# Patient Record
Sex: Female | Born: 1980 | Race: White | Hispanic: No | Smoking: Never smoker
Health system: Southern US, Community
[De-identification: ages and names within clinical notes are randomized; demographics above are authoritative.]

## PROBLEM LIST (undated history)

## (undated) DIAGNOSIS — N200 Calculus of kidney: Secondary | ICD-10-CM

## (undated) DIAGNOSIS — M5136 Other intervertebral disc degeneration, lumbar region: Secondary | ICD-10-CM

## (undated) DIAGNOSIS — U071 COVID-19: Secondary | ICD-10-CM

## (undated) DIAGNOSIS — M51369 Other intervertebral disc degeneration, lumbar region without mention of lumbar back pain or lower extremity pain: Secondary | ICD-10-CM

## (undated) DIAGNOSIS — D649 Anemia, unspecified: Secondary | ICD-10-CM

## (undated) DIAGNOSIS — E119 Type 2 diabetes mellitus without complications: Secondary | ICD-10-CM

## (undated) DIAGNOSIS — I82409 Acute embolism and thrombosis of unspecified deep veins of unspecified lower extremity: Secondary | ICD-10-CM

## (undated) HISTORY — PX: CARPAL TUNNEL RELEASE: SHX101

## (undated) HISTORY — DX: Calculus of kidney: N20.0

## (undated) HISTORY — PX: SHOULDER ARTHROSCOPY: SHX128

---

## 2004-06-28 ENCOUNTER — Emergency Department: Payer: Self-pay | Admitting: General Practice

## 2004-10-28 ENCOUNTER — Emergency Department: Payer: Self-pay | Admitting: Emergency Medicine

## 2006-03-31 ENCOUNTER — Emergency Department: Payer: Self-pay | Admitting: Emergency Medicine

## 2007-08-12 HISTORY — PX: TUBAL LIGATION: SHX77

## 2008-07-09 ENCOUNTER — Emergency Department: Payer: Self-pay | Admitting: Emergency Medicine

## 2009-06-23 ENCOUNTER — Emergency Department: Payer: Self-pay | Admitting: Emergency Medicine

## 2009-06-24 ENCOUNTER — Emergency Department: Payer: Self-pay | Admitting: Internal Medicine

## 2010-01-21 ENCOUNTER — Emergency Department: Payer: Self-pay | Admitting: Emergency Medicine

## 2010-02-02 ENCOUNTER — Ambulatory Visit: Payer: Self-pay | Admitting: Gastroenterology

## 2010-02-02 ENCOUNTER — Emergency Department: Payer: Self-pay | Admitting: Unknown Physician Specialty

## 2010-03-02 ENCOUNTER — Ambulatory Visit: Payer: Self-pay | Admitting: Gastroenterology

## 2010-03-25 ENCOUNTER — Ambulatory Visit: Payer: Self-pay | Admitting: Gastroenterology

## 2010-04-01 ENCOUNTER — Ambulatory Visit: Payer: Self-pay | Admitting: Gastroenterology

## 2010-04-07 LAB — PATHOLOGY REPORT

## 2010-04-21 ENCOUNTER — Ambulatory Visit: Payer: Self-pay | Admitting: Gastroenterology

## 2010-05-12 ENCOUNTER — Ambulatory Visit: Payer: Self-pay | Admitting: Gastroenterology

## 2010-08-12 DIAGNOSIS — F341 Dysthymic disorder: Secondary | ICD-10-CM | POA: Insufficient documentation

## 2010-08-12 DIAGNOSIS — F411 Generalized anxiety disorder: Secondary | ICD-10-CM | POA: Insufficient documentation

## 2010-08-22 ENCOUNTER — Ambulatory Visit: Payer: Self-pay

## 2010-08-24 ENCOUNTER — Ambulatory Visit: Payer: Self-pay

## 2010-11-25 ENCOUNTER — Ambulatory Visit: Payer: Self-pay

## 2010-12-09 ENCOUNTER — Ambulatory Visit: Payer: Self-pay

## 2011-04-08 ENCOUNTER — Ambulatory Visit: Payer: Self-pay

## 2011-04-12 ENCOUNTER — Emergency Department: Payer: Self-pay | Admitting: Emergency Medicine

## 2011-04-27 DIAGNOSIS — M5430 Sciatica, unspecified side: Secondary | ICD-10-CM | POA: Insufficient documentation

## 2011-06-10 DIAGNOSIS — E1165 Type 2 diabetes mellitus with hyperglycemia: Secondary | ICD-10-CM | POA: Insufficient documentation

## 2011-06-10 DIAGNOSIS — R609 Edema, unspecified: Secondary | ICD-10-CM | POA: Insufficient documentation

## 2011-06-10 DIAGNOSIS — N920 Excessive and frequent menstruation with regular cycle: Secondary | ICD-10-CM | POA: Insufficient documentation

## 2011-12-27 DIAGNOSIS — M25561 Pain in right knee: Secondary | ICD-10-CM | POA: Insufficient documentation

## 2012-04-15 ENCOUNTER — Emergency Department: Payer: Self-pay | Admitting: Emergency Medicine

## 2012-04-15 LAB — COMPREHENSIVE METABOLIC PANEL
Anion Gap: 11 (ref 7–16)
BUN: 12 mg/dL (ref 7–18)
Bilirubin,Total: 3.1 mg/dL — ABNORMAL HIGH (ref 0.2–1.0)
Calcium, Total: 8.4 mg/dL — ABNORMAL LOW (ref 8.5–10.1)
Chloride: 103 mmol/L (ref 98–107)
Glucose: 380 mg/dL — ABNORMAL HIGH (ref 65–99)
Potassium: 3.4 mmol/L — ABNORMAL LOW (ref 3.5–5.1)
SGOT(AST): 23 U/L (ref 15–37)
Sodium: 133 mmol/L — ABNORMAL LOW (ref 136–145)

## 2012-04-15 LAB — URINALYSIS, COMPLETE
Glucose,UR: >=500 mg/dL (ref 0–75)
Nitrite: NEGATIVE
Ph: 6 (ref 4.5–8.0)
RBC,UR: 202 /HPF (ref 0–5)
Specific Gravity: 1.038 (ref 1.003–1.030)
Squamous Epithelial: 11

## 2012-04-15 LAB — CBC
HCT: 45 % (ref 35.0–47.0)
MCHC: 34.7 g/dL (ref 32.0–36.0)
MCV: 86 fL (ref 80–100)
WBC: 5.7 10*3/uL (ref 3.6–11.0)

## 2012-04-15 LAB — LIPASE, BLOOD: Lipase: 116 U/L (ref 73–393)

## 2012-08-06 ENCOUNTER — Emergency Department: Payer: Self-pay | Admitting: Emergency Medicine

## 2012-08-06 LAB — URINALYSIS, COMPLETE
Bilirubin,UR: NEGATIVE
Glucose,UR: 50 mg/dL (ref 0–75)
Ketone: NEGATIVE
Nitrite: POSITIVE
Ph: 7 (ref 4.5–8.0)
Protein: 30

## 2012-08-06 LAB — CBC
HGB: 13.2 g/dL (ref 12.0–16.0)
MCH: 29.3 pg (ref 26.0–34.0)
WBC: 6.7 10*3/uL (ref 3.6–11.0)

## 2012-08-06 LAB — BASIC METABOLIC PANEL
Anion Gap: 5 — ABNORMAL LOW (ref 7–16)
Co2: 28 mmol/L (ref 21–32)
EGFR (Non-African Amer.): >60
Glucose: 182 mg/dL — ABNORMAL HIGH (ref 65–99)
Osmolality: 285 (ref 275–301)
Sodium: 141 mmol/L (ref 136–145)

## 2012-08-06 LAB — CK TOTAL AND CKMB (NOT AT ARMC)
CK, Total: 198 U/L (ref 21–215)
CK-MB: 2.8 ng/mL (ref 0.5–3.6)
CK-MB: 2.2 ng/mL (ref 0.5–3.6)

## 2012-09-12 DIAGNOSIS — G5603 Carpal tunnel syndrome, bilateral upper limbs: Secondary | ICD-10-CM | POA: Insufficient documentation

## 2012-11-16 DIAGNOSIS — E282 Polycystic ovarian syndrome: Secondary | ICD-10-CM | POA: Insufficient documentation

## 2012-11-16 DIAGNOSIS — E1140 Type 2 diabetes mellitus with diabetic neuropathy, unspecified: Secondary | ICD-10-CM | POA: Insufficient documentation

## 2013-05-27 ENCOUNTER — Emergency Department: Payer: Self-pay | Admitting: Emergency Medicine

## 2013-05-27 LAB — COMPREHENSIVE METABOLIC PANEL
Albumin: 3.9 g/dL (ref 3.4–5.0)
Alkaline Phosphatase: 48 U/L
Anion Gap: 8 (ref 7–16)
BUN: 9 mg/dL (ref 7–18)
Bilirubin,Total: 1.4 mg/dL — ABNORMAL HIGH (ref 0.2–1.0)
CREATININE: 0.9 mg/dL (ref 0.60–1.30)
Calcium, Total: 8.7 mg/dL (ref 8.5–10.1)
Chloride: 106 mmol/L (ref 98–107)
Co2: 22 mmol/L (ref 21–32)
EGFR (African American): >60
EGFR (Non-African Amer.): >60
Glucose: 289 mg/dL — ABNORMAL HIGH (ref 65–99)
Osmolality: 281 (ref 275–301)
POTASSIUM: 3.8 mmol/L (ref 3.5–5.1)
SGOT(AST): 46 U/L — ABNORMAL HIGH (ref 15–37)
SGPT (ALT): 51 U/L (ref 12–78)
SODIUM: 136 mmol/L (ref 136–145)
Total Protein: 7.4 g/dL (ref 6.4–8.2)

## 2013-05-27 LAB — CBC WITH DIFFERENTIAL/PLATELET
Basophil #: 0.1 10*3/uL (ref 0.0–0.1)
Basophil %: 0.8 %
EOS ABS: 0.2 10*3/uL (ref 0.0–0.7)
Eosinophil %: 2.6 %
HCT: 42.2 % (ref 35.0–47.0)
HGB: 14 g/dL (ref 12.0–16.0)
LYMPHS PCT: 42.5 %
Lymphocyte #: 2.8 10*3/uL (ref 1.0–3.6)
MCH: 28.8 pg (ref 26.0–34.0)
MCHC: 33.2 g/dL (ref 32.0–36.0)
MCV: 87 fL (ref 80–100)
MONO ABS: 0.3 x10 3/mm (ref 0.2–0.9)
Monocyte %: 4.1 %
NEUTROS ABS: 3.3 10*3/uL (ref 1.4–6.5)
Neutrophil %: 50 %
PLATELETS: 168 10*3/uL (ref 150–440)
RBC: 4.86 10*6/uL (ref 3.80–5.20)
RDW: 13.8 % (ref 11.5–14.5)
WBC: 6.5 10*3/uL (ref 3.6–11.0)

## 2013-05-27 LAB — URINALYSIS, COMPLETE
Bacteria: NONE SEEN
Bilirubin,UR: NEGATIVE
Blood: NEGATIVE
Glucose,UR: >=500 mg/dL (ref 0–75)
KETONE: NEGATIVE
Leukocyte Esterase: NEGATIVE
NITRITE: NEGATIVE
Ph: 5 (ref 4.5–8.0)
Protein: 30
SPECIFIC GRAVITY: 1.037 (ref 1.003–1.030)
Squamous Epithelial: 2

## 2013-05-27 LAB — PREGNANCY, URINE: Pregnancy Test, Urine: NEGATIVE m[IU]/mL

## 2013-05-27 LAB — TROPONIN I

## 2013-05-27 LAB — LIPASE, BLOOD: LIPASE: 222 U/L (ref 73–393)

## 2013-06-11 ENCOUNTER — Ambulatory Visit: Payer: Self-pay | Admitting: Gastroenterology

## 2013-06-12 LAB — PATHOLOGY REPORT

## 2014-01-11 DIAGNOSIS — I82409 Acute embolism and thrombosis of unspecified deep veins of unspecified lower extremity: Secondary | ICD-10-CM

## 2014-01-11 HISTORY — DX: Acute embolism and thrombosis of unspecified deep veins of unspecified lower extremity: I82.409

## 2014-01-11 HISTORY — PX: BACK SURGERY: SHX140

## 2014-03-13 ENCOUNTER — Ambulatory Visit: Payer: Self-pay | Admitting: Physician Assistant

## 2014-03-29 ENCOUNTER — Emergency Department: Payer: Self-pay | Admitting: Emergency Medicine

## 2014-05-29 DIAGNOSIS — M5116 Intervertebral disc disorders with radiculopathy, lumbar region: Secondary | ICD-10-CM | POA: Insufficient documentation

## 2014-06-27 DIAGNOSIS — Z6841 Body Mass Index (BMI) 40.0 and over, adult: Secondary | ICD-10-CM | POA: Insufficient documentation

## 2014-06-27 DIAGNOSIS — E66813 Obesity, class 3: Secondary | ICD-10-CM | POA: Insufficient documentation

## 2016-02-23 ENCOUNTER — Emergency Department
Admission: EM | Admit: 2016-02-23 | Discharge: 2016-02-23 | Disposition: A | Discharge status: Home / Self Care | Payer: BC Managed Care – PPO | Attending: Emergency Medicine | Admitting: Emergency Medicine

## 2016-02-23 ENCOUNTER — Encounter: Payer: Self-pay | Admitting: Emergency Medicine

## 2016-02-23 ENCOUNTER — Emergency Department: Payer: BC Managed Care – PPO

## 2016-02-23 DIAGNOSIS — M549 Dorsalgia, unspecified: Secondary | ICD-10-CM | POA: Diagnosis not present

## 2016-02-23 DIAGNOSIS — R110 Nausea: Secondary | ICD-10-CM

## 2016-02-23 DIAGNOSIS — R109 Unspecified abdominal pain: Secondary | ICD-10-CM | POA: Insufficient documentation

## 2016-02-23 DIAGNOSIS — E119 Type 2 diabetes mellitus without complications: Secondary | ICD-10-CM | POA: Diagnosis not present

## 2016-02-23 HISTORY — DX: Type 2 diabetes mellitus without complications: E11.9

## 2016-02-23 LAB — POCT PREGNANCY, URINE: Preg Test, Ur: NEGATIVE

## 2016-02-23 LAB — URINALYSIS, COMPLETE (UACMP) WITH MICROSCOPIC
BILIRUBIN URINE: NEGATIVE
Bacteria, UA: NONE SEEN
HGB URINE DIPSTICK: NEGATIVE
KETONES UR: NEGATIVE mg/dL
LEUKOCYTES UA: NEGATIVE
NITRITE: NEGATIVE
PH: 5 (ref 5.0–8.0)
PROTEIN: 100 mg/dL — AB
Specific Gravity, Urine: 1.037 — ABNORMAL HIGH (ref 1.005–1.030)

## 2016-02-23 LAB — CBC
HCT: 40.4 % (ref 35.0–47.0)
HEMOGLOBIN: 14.3 g/dL (ref 12.0–16.0)
MCH: 30.3 pg (ref 26.0–34.0)
MCHC: 35.3 g/dL (ref 32.0–36.0)
MCV: 85.7 fL (ref 80.0–100.0)
Platelets: 151 10*3/uL (ref 150–440)
RBC: 4.71 MIL/uL (ref 3.80–5.20)
RDW: 13.2 % (ref 11.5–14.5)
WBC: 6.3 10*3/uL (ref 3.6–11.0)

## 2016-02-23 LAB — COMPREHENSIVE METABOLIC PANEL
ALT: 27 U/L (ref 14–54)
ANION GAP: 7 (ref 5–15)
AST: 28 U/L (ref 15–41)
Albumin: 4.1 g/dL (ref 3.5–5.0)
Alkaline Phosphatase: 38 U/L (ref 38–126)
BUN: 13 mg/dL (ref 6–20)
CALCIUM: 9.1 mg/dL (ref 8.9–10.3)
CHLORIDE: 107 mmol/L (ref 101–111)
CO2: 22 mmol/L (ref 22–32)
Creatinine, Ser: 0.43 mg/dL — ABNORMAL LOW (ref 0.44–1.00)
Glucose, Bld: 309 mg/dL — ABNORMAL HIGH (ref 65–99)
Potassium: 3.8 mmol/L (ref 3.5–5.1)
Sodium: 136 mmol/L (ref 135–145)
Total Bilirubin: 1.9 mg/dL — ABNORMAL HIGH (ref 0.3–1.2)
Total Protein: 7.2 g/dL (ref 6.5–8.1)

## 2016-02-23 LAB — LIPASE, BLOOD: LIPASE: 34 U/L (ref 11–51)

## 2016-02-23 NOTE — ED Notes (Signed)
Could not provide a urine sample. Gave a specimen cup to use if she could.

## 2016-02-23 NOTE — ED Provider Notes (Signed)
[facility] Emergency Department Provider Note   ____________________________________________    I have reviewed the triage vital signs and the nursing notes.   HISTORY  Chief Complaint Abdominal Pain     HPI Katelyn Barnes is a 36 y.o. female who presents with complaints of nausea and mild abdominal discomfort. Patient reports she had a x-ray of her upper back performed 3 days ago because of a work accident. She was called this morning and notify that her x-ray may show a partial small bowel obstruction. She complains primarily of nausea and gas but denies bloating or severe abdominal pain. No history of abdominal surgeries   Past Medical History:  Diagnosis Date  . Diabetes mellitus without complication (HCC)     There are no active problems to display for this patient.   No past surgical history on file.  Prior to Admission medications   Not on File     Allergies Penicillins  No family history on file.  Social History Does not smoke, social alcohol use  Review of Systems  Constitutional: No fever/chills  Cardiovascular: Denies chest pain. Respiratory: Denies shortness of breath. Gastrointestinal: As above Genitourinary: Negative for dysuria. Musculoskeletal: Mild upper back pain Skin: Negative for rash. Neurological: Negative for  weakness  10-point ROS otherwise negative.  ____________________________________________   PHYSICAL EXAM:  VITAL SIGNS: ED Triage Vitals  Enc Vitals Group     BP 02/23/16 1005 133/74     Pulse Rate 02/23/16 1005 86     Resp 02/23/16 1005 18     Temp 02/23/16 1005 98.2 F (36.8 C)     Temp Source 02/23/16 1005 Oral     SpO2 02/23/16 1005 100 %     Weight 02/23/16 1006 266 lb (120.7 kg)     Height 02/23/16 1006 5\' 6"  (1.676 m)     Head Circumference --      Peak Flow --      Pain Score 02/23/16 1016 4     Pain Loc --      Pain Edu? --      Excl. in GC? --      Constitutional: Alert and oriented. No acute distress. Pleasant and interactive Eyes: Conjunctivae are normal.   Nose: No congestion/rhinnorhea. Mouth/Throat: Mucous membranes are moist.    Cardiovascular: Normal rate, regular rhythm. Grossly normal heart sounds.  Good peripheral circulation. Respiratory: Normal respiratory effort.  No retractions. Lungs CTAB. Gastrointestinal: Soft and nontender. No distention.  No CVA tenderness. Genitourinary: deferred Musculoskeletal:  Warm and well perfused Neurologic:  Normal speech and language. No gross focal neurologic deficits are appreciated.  Skin:  Skin is warm, dry and intact. No rash noted. Psychiatric: Mood and affect are normal. Speech and behavior are normal.  ____________________________________________   LABS (all labs ordered are listed, but only abnormal results are displayed)  Labs Reviewed  COMPREHENSIVE METABOLIC PANEL - Abnormal; Notable for the following:       Result Value   Glucose, Bld 309 (*)    Creatinine, Ser 0.43 (*)    Total Bilirubin 1.9 (*)    All other components within normal limits  URINALYSIS, COMPLETE (UACMP) WITH MICROSCOPIC - Abnormal; Notable for the following:    Color, Urine YELLOW (*)    APPearance CLEAR (*)    Specific Gravity, Urine 1.037 (*)    Glucose, UA >=500 (*)    Protein, ur 100 (*)    Squamous Epithelial / LPF 0-5 (*)    All  other components within normal limits  LIPASE, BLOOD  CBC  POCT PREGNANCY, URINE  POC URINE PREG, ED   ____________________________________________  EKG  None ____________________________________________  RADIOLOGY  X-rays unremarkable ____________________________________________   PROCEDURES  Procedure(s) performed: No    Critical Care performed: No ____________________________________________   INITIAL IMPRESSION / ASSESSMENT AND PLAN / ED COURSE  Pertinent labs & imaging results that were available during my care of the patient were  reviewed by me and considered in my medical decision making (see chart for details).  Patient well-appearing and in no acute distress. Her abdominal exam is reassuring and I strongly doubt SBO but we will obtain dedicated abdominal films    Normal abdominal x-rays, patient reassured, appropriate for discharge at this time ____________________________________________   FINAL CLINICAL IMPRESSION(S) / ED DIAGNOSES  Final diagnoses:  Nausea      NEW MEDICATIONS STARTED DURING THIS VISIT:  New Prescriptions   No medications on file     Note:  This document was prepared using Dragon voice recognition software and may include unintentional dictation errors.    Jene Every, MD 02/23/16 [phone]

## 2016-02-23 NOTE — ED Notes (Signed)
Pt states she was called by PCP this AM for a abnormal xray, states she saw them Friday for abd pain, states some mild nausea but has been tolerating food, states small BM this AM, pt awake and alert in no acute distress

## 2016-02-23 NOTE — ED Triage Notes (Signed)
Pt here after having back xray on Friday and was told she has a partial bowel obstruction. Pt reports intermittent abdominal pain, reports she's only able to have small BMs, a lot of belching and gas.

## 2016-04-29 ENCOUNTER — Other Ambulatory Visit: Payer: Self-pay | Admitting: Orthopedic Surgery

## 2016-05-03 ENCOUNTER — Ambulatory Visit (HOSPITAL_COMMUNITY)
Admission: RE | Admit: 2016-05-03 | Discharge: 2016-05-03 | Disposition: A | Discharge status: Home / Self Care | Payer: Worker's Compensation | Source: Ambulatory Visit | Attending: Orthopedic Surgery | Admitting: Orthopedic Surgery

## 2016-05-03 ENCOUNTER — Encounter (HOSPITAL_COMMUNITY): Payer: Self-pay

## 2016-05-03 ENCOUNTER — Encounter (HOSPITAL_COMMUNITY)
Admission: RE | Admit: 2016-05-03 | Discharge: 2016-05-03 | Disposition: A | Discharge status: Home / Self Care | Payer: Worker's Compensation | Source: Ambulatory Visit | Attending: Orthopedic Surgery | Admitting: Orthopedic Surgery

## 2016-05-03 DIAGNOSIS — E119 Type 2 diabetes mellitus without complications: Secondary | ICD-10-CM

## 2016-05-03 DIAGNOSIS — Z01812 Encounter for preprocedural laboratory examination: Secondary | ICD-10-CM

## 2016-05-03 DIAGNOSIS — Z7984 Long term (current) use of oral hypoglycemic drugs: Secondary | ICD-10-CM | POA: Insufficient documentation

## 2016-05-03 DIAGNOSIS — Z01818 Encounter for other preprocedural examination: Secondary | ICD-10-CM | POA: Insufficient documentation

## 2016-05-03 DIAGNOSIS — Z79899 Other long term (current) drug therapy: Secondary | ICD-10-CM

## 2016-05-03 DIAGNOSIS — Z9851 Tubal ligation status: Secondary | ICD-10-CM

## 2016-05-03 DIAGNOSIS — Z9889 Other specified postprocedural states: Secondary | ICD-10-CM

## 2016-05-03 HISTORY — DX: Acute embolism and thrombosis of unspecified deep veins of unspecified lower extremity: I82.409

## 2016-05-03 HISTORY — DX: Other intervertebral disc degeneration, lumbar region: M51.36

## 2016-05-03 HISTORY — DX: Other intervertebral disc degeneration, lumbar region without mention of lumbar back pain or lower extremity pain: M51.369

## 2016-05-03 LAB — URINALYSIS, ROUTINE W REFLEX MICROSCOPIC
BILIRUBIN URINE: NEGATIVE
GLUCOSE, UA: 50 mg/dL — AB
HGB URINE DIPSTICK: NEGATIVE
KETONES UR: NEGATIVE mg/dL
NITRITE: NEGATIVE
PH: 5 (ref 5.0–8.0)
PROTEIN: 30 mg/dL — AB
Specific Gravity, Urine: 1.005 (ref 1.005–1.030)

## 2016-05-03 LAB — COMPREHENSIVE METABOLIC PANEL
ALBUMIN: 4 g/dL (ref 3.5–5.0)
ALT: 33 U/L (ref 14–54)
AST: 33 U/L (ref 15–41)
Alkaline Phosphatase: 35 U/L — ABNORMAL LOW (ref 38–126)
Anion gap: 7 (ref 5–15)
BUN: 5 mg/dL — ABNORMAL LOW (ref 6–20)
CALCIUM: 9.1 mg/dL (ref 8.9–10.3)
CHLORIDE: 108 mmol/L (ref 101–111)
CO2: 23 mmol/L (ref 22–32)
Creatinine, Ser: 0.49 mg/dL (ref 0.44–1.00)
GFR calc Af Amer: >60 mL/min (ref >60)
GFR calc non Af Amer: >60 mL/min (ref >60)
GLUCOSE: 91 mg/dL (ref 65–99)
POTASSIUM: 3.6 mmol/L (ref 3.5–5.1)
SODIUM: 138 mmol/L (ref 135–145)
Total Bilirubin: 1.8 mg/dL — ABNORMAL HIGH (ref 0.3–1.2)
Total Protein: 6.8 g/dL (ref 6.5–8.1)

## 2016-05-03 LAB — CBC WITH DIFFERENTIAL/PLATELET
BASOS PCT: 0 %
Basophils Absolute: 0 10*3/uL (ref 0.0–0.1)
EOS ABS: 0.2 10*3/uL (ref 0.0–0.7)
Eosinophils Relative: 2 %
HCT: 41.4 % (ref 36.0–46.0)
HEMOGLOBIN: 13.9 g/dL (ref 12.0–15.0)
Lymphocytes Relative: 36 %
Lymphs Abs: 2.4 10*3/uL (ref 0.7–4.0)
MCH: 29.1 pg (ref 26.0–34.0)
MCHC: 33.6 g/dL (ref 30.0–36.0)
MCV: 86.8 fL (ref 78.0–100.0)
MONOS PCT: 6 %
Monocytes Absolute: 0.4 10*3/uL (ref 0.1–1.0)
NEUTROS PCT: 56 %
Neutro Abs: 3.7 10*3/uL (ref 1.7–7.7)
PLATELETS: 177 10*3/uL (ref 150–400)
RBC: 4.77 MIL/uL (ref 3.87–5.11)
RDW: 13.3 % (ref 11.5–15.5)
WBC: 6.7 10*3/uL (ref 4.0–10.5)

## 2016-05-03 LAB — PROTIME-INR
INR: 1.06
Prothrombin Time: 13.8 seconds (ref 11.4–15.2)

## 2016-05-03 LAB — SURGICAL PCR SCREEN
MRSA, PCR: NEGATIVE
Staphylococcus aureus: NEGATIVE

## 2016-05-03 LAB — HCG, SERUM, QUALITATIVE: PREG SERUM: NEGATIVE

## 2016-05-03 LAB — APTT: aPTT: 28 seconds (ref 24–36)

## 2016-05-03 LAB — GLUCOSE, CAPILLARY: GLUCOSE-CAPILLARY: 120 mg/dL — AB (ref 65–99)

## 2016-05-03 NOTE — Pre-Procedure Instructions (Signed)
Jean Alejos  05/03/2016     Your procedure is scheduled on Wednesday, April 25.  Report to [facility] Admitting at  11:15 AM                Your surgery or procedure is scheduled for 2:15 PM    Call this number if you have problems the morning of surgery: [phone]               For any other questions, please call [phone], Monday - Friday 8 AM - 4 PM.   Remember:  Do not eat food or drink liquids after midnight Tuesday, April 24.  Take these medicines the morning of surgery with A SIP OF WATER: methocarbamol (ROBAXIN).                   DO NOT Take Metformin or Insulin the morning of surgery.    How to Manage Your Diabetes Before and After Surgery  Why is it important to control my blood sugar before and after surgery? . Improving blood sugar levels before and after surgery helps healing and can limit problems. . A way of improving blood sugar control is eating a healthy diet by: o  Eating less sugar and carbohydrates o  Increasing activity/exercise o  Talking with your doctor about reaching your blood sugar goals . High blood sugars (greater than 180 mg/dL) can raise your risk of infections and slow your recovery, so you will need to focus on controlling your diabetes during the weeks before surgery. . Make sure that the doctor who takes care of your diabetes knows about your planned surgery including the date and location.  How do I manage my blood sugar before surgery? . Check your blood sugar at least 4 times a day, starting 2 days before surgery, to make sure that the level is not too high or low. o Check your blood sugar the morning of your surgery when you wake up and every 2 hours until you get to the Short Stay unit. . If your blood sugar is less than 70 mg/dL, you will need to treat for low blood sugar: o Do not take insulin. o Treat a low blood sugar (less than 70 mg/dL) with  cup of clear juice (cranberry or apple), 4 glucose tablets,  OR glucose gel. o Recheck blood sugar in 15 minutes after treatment (to make sure it is greater than 70 mg/dL). If your blood sugar is not greater than 70 mg/dL on recheck, call [phone] for further instructions. . Report your blood sugar to the short stay nurse when you get to Short Stay.  . If you are admitted to the hospital after surgery: o Your blood sugar will be checked by the staff and you will probably be given insulin after surgery (instead of oral diabetes medicines) to make sure you have good blood sugar levels. o The goal for blood sugar control after surgery is 80-180 mg/dL.          WHAT DO I DO ABOUT MY DIABETES MEDICATION?   Marland Kitchen Do not take oral diabetes medicines (pills) the morning of surgery.  . THE NIGHT BEFORE SURGERY, take 52 units of _Novolog Mix 70/30 insulin.       . THE MORNING OF SURGERY, take 0  units of __Novolog 70/30 Iinsulin.  . The day of surgery, do not take other diabetes injectables, including Byetta (exenatide), Bydureon (exenatide ER), Victoza (liraglutide), or Trulicity (dulaglutide). Marland Kitchen  Patient Signature:  Date:   Nurse Signature:  Date:     Do not wear jewelry, make-up or nail polish.  Do not wear lotions, powders, or perfumes, or deoderant.  Do not shave 48 hours prior to surgery.  Men may shave face and neck.  Do not bring valuables to the hospital.  [facility] is not responsible for any belongings or valuables.  Contacts, dentures or bridgework may not be worn into surgery.  Leave your suitcase in the car.  After surgery it may be brought to your room.  For patients admitted to the hospital, discharge time will be determined by your treatment team.  Patients discharged the day of surgery will not be allowed to drive home.   Special instructions:  Review  [facility] - Preparing For Surgery.  Please read over the following fact sheets that you were given: [facility]- Preparing For Surgery and Patient Instructions  for Mupirocin Application, Coughing and Deep Breathing, Pain Booklet     Patient Signature:  Date:   Nurse Signature:  Date:

## 2016-05-04 LAB — HEMOGLOBIN A1C
Hgb A1c MFr Bld: 11.4 % — ABNORMAL HIGH (ref 4.8–5.6)
Mean Plasma Glucose: 280 mg/dL

## 2016-05-04 MED ORDER — VANCOMYCIN HCL 10 G IV SOLR
1500.0000 mg | INTRAVENOUS | Status: AC
  Administered 2016-05-05: 1500 mg via INTRAVENOUS
  Filled 2016-05-04: qty 1500

## 2016-05-04 NOTE — Progress Notes (Signed)
Anesthesia Chart Review: Patient is a 36 year old female scheduled for C7-T1 ACDF on 05/05/16 by Dr. Yevette Edwards.  History includes DM2, never smoker, back surgery '16 complicated by post-operative DVT (s/p Eliquis X 3 months), tubal ligation '09. BMI is consistent with morbid obesity.   Med include Lipitior, Flexeril, Neurontin, NovoLog 70/30, Victoza, metformin, Robaxin.  BP 130/86   Pulse 96   Temp 36.7 C   Resp 20   Ht  (1.676 m)   Wt 271 lb 3.2 oz (123 kg)   LMP 04/04/2016   SpO2 98%   BMI 43.77 kg/m   EKG 05/03/16: NSR, non-specific T wave abnormality.  Preoperative labs noted. UA showed trace leukocytes, negative nitrites. CBC, PT/PTT WNL. Serum pregnancy test negative. Cr 0.49. Total bilirubin 1.8 (stable when compared to 05/27/13 and 02/23/16 labs). Glucose 91, but A1c 11.4, consistent with mean plasma glucose of 280 (down from 13.6 on 01/27/16; she was restarted on Victoza at that time and continued in NovoLog 70/30 and metformin). I have called last two A1c results to [facility] at Dr. Marshell Levan office. Notified her that patient is high risk for cancellation if arrives with fasting glucose > 200. She will review with Dr. Yevette Edwards.  Velna Ochs [facility] Short Stay Center/Anesthesiology Phone [phone] 05/04/2016 11:53 AM

## 2016-05-05 ENCOUNTER — Inpatient Hospital Stay (HOSPITAL_COMMUNITY)
Admission: AD | Admit: 2016-05-05 | Discharge: 2016-05-06 | DRG: 473 | Disposition: A | Discharge status: Home / Self Care | Payer: Worker's Compensation | Source: Ambulatory Visit | Attending: Orthopedic Surgery | Admitting: Orthopedic Surgery

## 2016-05-05 ENCOUNTER — Ambulatory Visit (HOSPITAL_COMMUNITY): Payer: Worker's Compensation | Admitting: Certified Registered Nurse Anesthetist

## 2016-05-05 ENCOUNTER — Ambulatory Visit (HOSPITAL_COMMUNITY): Payer: Worker's Compensation

## 2016-05-05 ENCOUNTER — Encounter (HOSPITAL_COMMUNITY)
Admission: AD | Disposition: A | Discharge status: Home / Self Care | Payer: Self-pay | Source: Ambulatory Visit | Attending: Orthopedic Surgery

## 2016-05-05 ENCOUNTER — Ambulatory Visit (HOSPITAL_COMMUNITY): Payer: Worker's Compensation | Admitting: Vascular Surgery

## 2016-05-05 DIAGNOSIS — Z880 Allergy status to penicillin: Secondary | ICD-10-CM | POA: Diagnosis not present

## 2016-05-05 DIAGNOSIS — E119 Type 2 diabetes mellitus without complications: Secondary | ICD-10-CM | POA: Diagnosis present

## 2016-05-05 DIAGNOSIS — M5013 Cervical disc disorder with radiculopathy, cervicothoracic region: Principal | ICD-10-CM | POA: Diagnosis present

## 2016-05-05 DIAGNOSIS — Z419 Encounter for procedure for purposes other than remedying health state, unspecified: Secondary | ICD-10-CM

## 2016-05-05 DIAGNOSIS — M5410 Radiculopathy, site unspecified: Secondary | ICD-10-CM | POA: Diagnosis present

## 2016-05-05 DIAGNOSIS — M5136 Other intervertebral disc degeneration, lumbar region: Secondary | ICD-10-CM | POA: Diagnosis present

## 2016-05-05 DIAGNOSIS — M5412 Radiculopathy, cervical region: Secondary | ICD-10-CM | POA: Diagnosis present

## 2016-05-05 DIAGNOSIS — M4803 Spinal stenosis, cervicothoracic region: Secondary | ICD-10-CM | POA: Diagnosis present

## 2016-05-05 DIAGNOSIS — Z794 Long term (current) use of insulin: Secondary | ICD-10-CM

## 2016-05-05 DIAGNOSIS — Z79899 Other long term (current) drug therapy: Secondary | ICD-10-CM | POA: Diagnosis not present

## 2016-05-05 DIAGNOSIS — Z7984 Long term (current) use of oral hypoglycemic drugs: Secondary | ICD-10-CM

## 2016-05-05 DIAGNOSIS — M5114 Intervertebral disc disorders with radiculopathy, thoracic region: Secondary | ICD-10-CM | POA: Diagnosis present

## 2016-05-05 DIAGNOSIS — Z86718 Personal history of other venous thrombosis and embolism: Secondary | ICD-10-CM

## 2016-05-05 HISTORY — PX: ANTERIOR CERVICAL DECOMP/DISCECTOMY FUSION: SHX1161

## 2016-05-05 LAB — GLUCOSE, CAPILLARY
GLUCOSE-CAPILLARY: 115 mg/dL — AB (ref 65–99)
GLUCOSE-CAPILLARY: 137 mg/dL — AB (ref 65–99)
Glucose-Capillary: 96 mg/dL (ref 65–99)
Glucose-Capillary: 126 mg/dL — ABNORMAL HIGH (ref 65–99)

## 2016-05-05 SURGERY — ANTERIOR CERVICAL DECOMPRESSION/DISCECTOMY FUSION 1 LEVEL
Anesthesia: General | Site: Spine Cervical | Laterality: Right

## 2016-05-05 MED ORDER — ACETAMINOPHEN 650 MG RE SUPP: 650.0000 mg | RECTAL | Status: DC | PRN

## 2016-05-05 MED ORDER — VANCOMYCIN HCL IN DEXTROSE 1-5 GM/200ML-% IV SOLN
1000.0000 mg | Freq: Once | INTRAVENOUS | Status: AC
  Administered 2016-05-05: 1000 mg via INTRAVENOUS

## 2016-05-05 MED ORDER — LIDOCAINE 2% (20 MG/ML) 5 ML SYRINGE
INTRAMUSCULAR | Status: AC
  Filled 2016-05-05: qty 5

## 2016-05-05 MED ORDER — METFORMIN HCL 500 MG PO TABS: 1000.0000 mg | ORAL_TABLET | Freq: Two times a day (BID) | ORAL | Status: DC

## 2016-05-05 MED ORDER — ACETAMINOPHEN 325 MG PO TABS: 650.0000 mg | ORAL_TABLET | ORAL | Status: DC | PRN

## 2016-05-05 MED ORDER — LIDOCAINE HCL (CARDIAC) 20 MG/ML IV SOLN
INTRAVENOUS | Status: DC | PRN
  Administered 2016-05-05: 100 mg via INTRAVENOUS

## 2016-05-05 MED ORDER — MIDAZOLAM HCL 5 MG/5ML IJ SOLN
INTRAMUSCULAR | Status: DC | PRN
  Administered 2016-05-05: 2 mg via INTRAVENOUS

## 2016-05-05 MED ORDER — SUGAMMADEX SODIUM 500 MG/5ML IV SOLN
INTRAVENOUS | Status: AC
  Filled 2016-05-05: qty 5

## 2016-05-05 MED ORDER — PROPOFOL 10 MG/ML IV BOLUS
INTRAVENOUS | Status: AC
  Filled 2016-05-05: qty 20

## 2016-05-05 MED ORDER — FENTANYL CITRATE (PF) 250 MCG/5ML IJ SOLN
INTRAMUSCULAR | Status: AC
Start: 2016-05-05 — End: 2016-05-05
  Filled 2016-05-05: qty 5

## 2016-05-05 MED ORDER — LACTATED RINGERS IV SOLN: INTRAVENOUS | Status: DC

## 2016-05-05 MED ORDER — GABAPENTIN 400 MG PO CAPS
1200.0000 mg | ORAL_CAPSULE | Freq: Every day | ORAL | Status: DC
  Administered 2016-05-05: 1200 mg via ORAL
  Filled 2016-05-05: qty 3

## 2016-05-05 MED ORDER — SODIUM CHLORIDE 0.9% FLUSH: 3.0000 mL | INTRAVENOUS | Status: DC | PRN

## 2016-05-05 MED ORDER — ONDANSETRON HCL 4 MG PO TABS: 4.0000 mg | ORAL_TABLET | Freq: Four times a day (QID) | ORAL | Status: DC | PRN

## 2016-05-05 MED ORDER — FENTANYL CITRATE (PF) 100 MCG/2ML IJ SOLN
INTRAMUSCULAR | Status: AC
Start: 2016-05-05 — End: 2016-05-06
  Filled 2016-05-05: qty 2

## 2016-05-05 MED ORDER — MORPHINE SULFATE (PF) 4 MG/ML IV SOLN
1.0000 mg | INTRAVENOUS | Status: DC | PRN
  Administered 2016-05-05: 2 mg via INTRAVENOUS
  Filled 2016-05-05: qty 1

## 2016-05-05 MED ORDER — FENTANYL CITRATE (PF) 100 MCG/2ML IJ SOLN
INTRAMUSCULAR | Status: DC | PRN
  Administered 2016-05-05: 100 ug via INTRAVENOUS
  Administered 2016-05-05 (×6): 50 ug via INTRAVENOUS
  Administered 2016-05-05: 100 ug via INTRAVENOUS

## 2016-05-05 MED ORDER — INSULIN ASPART PROT & ASPART (70-30 MIX) 100 UNIT/ML ~~LOC~~ SUSP
85.0000 [IU] | Freq: Every day | SUBCUTANEOUS | Status: DC
  Administered 2016-05-06: 85 [IU] via SUBCUTANEOUS

## 2016-05-05 MED ORDER — ONDANSETRON HCL 4 MG/2ML IJ SOLN
INTRAMUSCULAR | Status: DC | PRN
  Administered 2016-05-05: 4 mg via INTRAVENOUS

## 2016-05-05 MED ORDER — PHENYLEPHRINE 40 MCG/ML (10ML) SYRINGE FOR IV PUSH (FOR BLOOD PRESSURE SUPPORT)
PREFILLED_SYRINGE | INTRAVENOUS | Status: AC
  Filled 2016-05-05: qty 10

## 2016-05-05 MED ORDER — DIAZEPAM 5 MG PO TABS
5.0000 mg | ORAL_TABLET | Freq: Four times a day (QID) | ORAL | Status: DC | PRN
  Administered 2016-05-05 – 2016-05-06 (×2): 5 mg via ORAL
  Filled 2016-05-05 (×2): qty 1

## 2016-05-05 MED ORDER — FENTANYL CITRATE (PF) 250 MCG/5ML IJ SOLN
INTRAMUSCULAR | Status: AC
  Filled 2016-05-05: qty 5

## 2016-05-05 MED ORDER — PHENOL 1.4 % MT LIQD
1.0000 | OROMUCOSAL | Status: DC | PRN
  Filled 2016-05-05: qty 177

## 2016-05-05 MED ORDER — FLEET ENEMA 7-19 GM/118ML RE ENEM: 1.0000 | ENEMA | Freq: Once | RECTAL | Status: DC | PRN

## 2016-05-05 MED ORDER — ROCURONIUM BROMIDE 100 MG/10ML IV SOLN
INTRAVENOUS | Status: DC | PRN
  Administered 2016-05-05: 80 mg via INTRAVENOUS
  Administered 2016-05-05: 10 mg via INTRAVENOUS
  Administered 2016-05-05: 20 mg via INTRAVENOUS
  Administered 2016-05-05: 10 mg via INTRAVENOUS

## 2016-05-05 MED ORDER — ONDANSETRON HCL 4 MG/2ML IJ SOLN
INTRAMUSCULAR | Status: AC
  Filled 2016-05-05: qty 6

## 2016-05-05 MED ORDER — ALBUMIN HUMAN 5 % IV SOLN
INTRAVENOUS | Status: DC | PRN
  Administered 2016-05-05: 14:00:00 via INTRAVENOUS

## 2016-05-05 MED ORDER — BUPIVACAINE-EPINEPHRINE 0.25% -1:200000 IJ SOLN
INTRAMUSCULAR | Status: DC | PRN
  Administered 2016-05-05: 3 mL

## 2016-05-05 MED ORDER — THROMBIN 20000 UNITS EX KIT
PACK | CUTANEOUS | Status: DC | PRN
  Administered 2016-05-05: 20000 [IU] via TOPICAL

## 2016-05-05 MED ORDER — ARTIFICIAL TEARS OPHTHALMIC OINT
TOPICAL_OINTMENT | OPHTHALMIC | Status: AC
  Filled 2016-05-05: qty 3.5

## 2016-05-05 MED ORDER — EPINEPHRINE PF 1 MG/ML IJ SOLN
INTRAMUSCULAR | Status: AC
  Filled 2016-05-05: qty 1

## 2016-05-05 MED ORDER — METFORMIN HCL 500 MG PO TABS
1000.0000 mg | ORAL_TABLET | Freq: Two times a day (BID) | ORAL | Status: DC
  Administered 2016-05-06: 1000 mg via ORAL
  Filled 2016-05-05: qty 2

## 2016-05-05 MED ORDER — THROMBIN 20000 UNITS EX SOLR
CUTANEOUS | Status: AC
  Filled 2016-05-05: qty 20000

## 2016-05-05 MED ORDER — EPHEDRINE 5 MG/ML INJ
INTRAVENOUS | Status: AC
  Filled 2016-05-05: qty 10

## 2016-05-05 MED ORDER — ONDANSETRON HCL 4 MG/2ML IJ SOLN
4.0000 mg | Freq: Four times a day (QID) | INTRAMUSCULAR | Status: DC | PRN
  Administered 2016-05-05: 4 mg via INTRAVENOUS
  Filled 2016-05-05: qty 2

## 2016-05-05 MED ORDER — LACTATED RINGERS IV SOLN
INTRAVENOUS | Status: DC
  Administered 2016-05-05 (×3): via INTRAVENOUS

## 2016-05-05 MED ORDER — ROCURONIUM BROMIDE 10 MG/ML (PF) SYRINGE
PREFILLED_SYRINGE | INTRAVENOUS | Status: AC
  Filled 2016-05-05: qty 5

## 2016-05-05 MED ORDER — PROPOFOL 10 MG/ML IV BOLUS
INTRAVENOUS | Status: DC | PRN
  Administered 2016-05-05: 200 mg via INTRAVENOUS

## 2016-05-05 MED ORDER — SODIUM CHLORIDE 0.9 % IV SOLN: 250.0000 mL | INTRAVENOUS | Status: DC

## 2016-05-05 MED ORDER — INSULIN ASPART PROT & ASPART (70-30 MIX) 100 UNIT/ML ~~LOC~~ SUSP
75.0000 [IU] | Freq: Every day | SUBCUTANEOUS | Status: DC
  Administered 2016-05-05: 75 [IU] via SUBCUTANEOUS
  Filled 2016-05-05: qty 10

## 2016-05-05 MED ORDER — SUGAMMADEX SODIUM 500 MG/5ML IV SOLN
INTRAVENOUS | Status: DC | PRN
  Administered 2016-05-05: 300 mg via INTRAVENOUS

## 2016-05-05 MED ORDER — MIDAZOLAM HCL 2 MG/2ML IJ SOLN
INTRAMUSCULAR | Status: AC
  Filled 2016-05-05: qty 2

## 2016-05-05 MED ORDER — ARTIFICIAL TEARS OPHTHALMIC OINT
TOPICAL_OINTMENT | OPHTHALMIC | Status: DC | PRN
  Administered 2016-05-05: 1 via OPHTHALMIC

## 2016-05-05 MED ORDER — KETOROLAC TROMETHAMINE 30 MG/ML IJ SOLN
INTRAMUSCULAR | Status: AC
  Filled 2016-05-05: qty 1

## 2016-05-05 MED ORDER — LIRAGLUTIDE 18 MG/3ML ~~LOC~~ SOPN: 1.8000 mg | PEN_INJECTOR | SUBCUTANEOUS | Status: DC

## 2016-05-05 MED ORDER — PANTOPRAZOLE SODIUM 40 MG IV SOLR: 40.0000 mg | Freq: Every day | INTRAVENOUS | Status: DC

## 2016-05-05 MED ORDER — PANTOPRAZOLE SODIUM 40 MG PO TBEC
40.0000 mg | DELAYED_RELEASE_TABLET | Freq: Every day | ORAL | Status: DC
  Administered 2016-05-05: 40 mg via ORAL
  Filled 2016-05-05: qty 1

## 2016-05-05 MED ORDER — MENTHOL 3 MG MT LOZG: 1.0000 | LOZENGE | OROMUCOSAL | Status: DC | PRN

## 2016-05-05 MED ORDER — SODIUM CHLORIDE 0.9% FLUSH: 3.0000 mL | Freq: Two times a day (BID) | INTRAVENOUS | Status: DC

## 2016-05-05 MED ORDER — DOCUSATE SODIUM 100 MG PO CAPS
100.0000 mg | ORAL_CAPSULE | Freq: Two times a day (BID) | ORAL | Status: DC
  Administered 2016-05-05: 100 mg via ORAL
  Filled 2016-05-05: qty 1

## 2016-05-05 MED ORDER — ZOLPIDEM TARTRATE 5 MG PO TABS: 5.0000 mg | ORAL_TABLET | Freq: Every evening | ORAL | Status: DC | PRN

## 2016-05-05 MED ORDER — POTASSIUM CHLORIDE IN NACL 20-0.9 MEQ/L-% IV SOLN: INTRAVENOUS | Status: DC

## 2016-05-05 MED ORDER — FENTANYL CITRATE (PF) 100 MCG/2ML IJ SOLN
25.0000 ug | INTRAMUSCULAR | Status: DC | PRN
  Administered 2016-05-05 (×4): 25 ug via INTRAVENOUS

## 2016-05-05 MED ORDER — OXYCODONE-ACETAMINOPHEN 5-325 MG PO TABS
1.0000 | ORAL_TABLET | ORAL | Status: DC | PRN
  Administered 2016-05-05 – 2016-05-06 (×3): 2 via ORAL
  Filled 2016-05-05 (×3): qty 2

## 2016-05-05 MED ORDER — SENNOSIDES-DOCUSATE SODIUM 8.6-50 MG PO TABS: 1.0000 | ORAL_TABLET | Freq: Every evening | ORAL | Status: DC | PRN

## 2016-05-05 MED ORDER — BISACODYL 5 MG PO TBEC: 5.0000 mg | DELAYED_RELEASE_TABLET | Freq: Every day | ORAL | Status: DC | PRN

## 2016-05-05 MED ORDER — BUPIVACAINE HCL (PF) 0.25 % IJ SOLN
INTRAMUSCULAR | Status: AC
  Filled 2016-05-05: qty 30

## 2016-05-05 MED ORDER — ATORVASTATIN CALCIUM 20 MG PO TABS
40.0000 mg | ORAL_TABLET | Freq: Every evening | ORAL | Status: DC
  Administered 2016-05-05: 40 mg via ORAL

## 2016-05-05 MED ORDER — ALUM & MAG HYDROXIDE-SIMETH 200-200-20 MG/5ML PO SUSP: 30.0000 mL | Freq: Four times a day (QID) | ORAL | Status: DC | PRN

## 2016-05-05 SURGICAL SUPPLY — 72 items
BENZOIN TINCTURE PRP APPL 2/3 (GAUZE/BANDAGES/DRESSINGS) ×2 IMPLANT
BIT DRILL NEURO 2X3.1 SFT TUCH (MISCELLANEOUS) ×1 IMPLANT
BIT DRILL SRG 14X2.2XFLT CHK (BIT) ×1 IMPLANT
BIT DRL SRG 14X2.2XFLT CHK (BIT) ×1
BLADE CLIPPER SURG (BLADE) IMPLANT
BLADE SURG 15 STRL LF DISP TIS (BLADE) IMPLANT
BLADE SURG 15 STRL SS (BLADE)
BONE VIVIGEN FORMABLE 1.3CC (Bone Implant) ×2 IMPLANT
BUR MATCHSTICK NEURO 3.0 LAGG (BURR) IMPLANT
CARTRIDGE OIL MAESTRO DRILL (MISCELLANEOUS) ×1 IMPLANT
CORDS BIPOLAR (ELECTRODE) ×4 IMPLANT
COVER SURGICAL LIGHT HANDLE (MISCELLANEOUS) ×2 IMPLANT
CRADLE DONUT ADULT HEAD (MISCELLANEOUS) ×2 IMPLANT
DIFFUSER DRILL AIR PNEUMATIC (MISCELLANEOUS) ×2 IMPLANT
DRAIN JACKSON RD 7FR 3/32 (WOUND CARE) IMPLANT
DRAPE C-ARM 42X72 X-RAY (DRAPES) ×2 IMPLANT
DRAPE POUCH INSTRU U-SHP 10X18 (DRAPES) ×2 IMPLANT
DRAPE SURG 17X23 STRL (DRAPES) ×8 IMPLANT
DRILL BIT SKYLINE 14MM (BIT) ×1
DRILL NEURO 2X3.1 SOFT TOUCH (MISCELLANEOUS) ×2
DURAPREP 26ML APPLICATOR (WOUND CARE) ×2 IMPLANT
DURAPREP 6ML APPLICATOR 50/CS (WOUND CARE) ×2 IMPLANT
ELECT COATED BLADE 2.86 ST (ELECTRODE) ×2 IMPLANT
ELECT REM PT RETURN 9FT ADLT (ELECTROSURGICAL) ×2
ELECTRODE REM PT RTRN 9FT ADLT (ELECTROSURGICAL) ×1 IMPLANT
EVACUATOR SILICONE 100CC (DRAIN) IMPLANT
GAUZE SPONGE 4X4 12PLY STRL (GAUZE/BANDAGES/DRESSINGS) ×2 IMPLANT
GAUZE SPONGE 4X4 16PLY XRAY LF (GAUZE/BANDAGES/DRESSINGS) ×2 IMPLANT
GLOVE BIO SURGEON STRL SZ7 (GLOVE) ×2 IMPLANT
GLOVE BIO SURGEON STRL SZ8 (GLOVE) ×2 IMPLANT
GLOVE BIOGEL PI IND STRL 7.0 (GLOVE) ×2 IMPLANT
GLOVE BIOGEL PI IND STRL 8 (GLOVE) ×1 IMPLANT
GLOVE BIOGEL PI INDICATOR 7.0 (GLOVE) ×2
GLOVE BIOGEL PI INDICATOR 8 (GLOVE) ×1
GOWN STRL REUS W/ TWL LRG LVL3 (GOWN DISPOSABLE) ×1 IMPLANT
GOWN STRL REUS W/ TWL XL LVL3 (GOWN DISPOSABLE) ×1 IMPLANT
GOWN STRL REUS W/TWL LRG LVL3 (GOWN DISPOSABLE) ×1
GOWN STRL REUS W/TWL XL LVL3 (GOWN DISPOSABLE) ×1
IMPL S ENDOSKEL TC 7 ODEG (Orthopedic Implant) ×1 IMPLANT
IMPLANT S ENDOSKEL TC 7 ODEG (Orthopedic Implant) ×2 IMPLANT
IV CATH 14GX2 1/4 (CATHETERS) ×2 IMPLANT
KIT BASIN OR (CUSTOM PROCEDURE TRAY) ×2 IMPLANT
KIT ROOM TURNOVER OR (KITS) ×2 IMPLANT
MANIFOLD NEPTUNE II (INSTRUMENTS) ×2 IMPLANT
NEEDLE PRECISIONGLIDE 27X1.5 (NEEDLE) ×2 IMPLANT
NEEDLE SPNL 20GX3.5 QUINCKE YW (NEEDLE) ×2 IMPLANT
NS IRRIG 1000ML POUR BTL (IV SOLUTION) ×2 IMPLANT
OIL CARTRIDGE MAESTRO DRILL (MISCELLANEOUS) ×2
PACK ORTHO CERVICAL (CUSTOM PROCEDURE TRAY) ×2 IMPLANT
PAD ARMBOARD 7.5X6 YLW CONV (MISCELLANEOUS) ×4 IMPLANT
PATTIES SURGICAL .5 X.5 (GAUZE/BANDAGES/DRESSINGS) IMPLANT
PATTIES SURGICAL .5 X1 (DISPOSABLE) IMPLANT
PEN SKIN MARKING BROAD (MISCELLANEOUS) ×2 IMPLANT
PIN DISTRACTION 14 (PIN) ×4 IMPLANT
PLATE SKYLINE 12MM (Plate) ×2 IMPLANT
SCREW SKYLINE VAR OS 14MM (Screw) ×8 IMPLANT
SPONGE INTESTINAL PEANUT (DISPOSABLE) ×2 IMPLANT
SPONGE SURGIFOAM ABS GEL 100 (HEMOSTASIS) IMPLANT
STRIP CLOSURE SKIN 1/2X4 (GAUZE/BANDAGES/DRESSINGS) ×2 IMPLANT
SURGIFLO W/THROMBIN 8M KIT (HEMOSTASIS) IMPLANT
SUT MNCRL AB 4-0 PS2 18 (SUTURE) IMPLANT
SUT SILK 4 0 (SUTURE)
SUT SILK 4-0 18XBRD TIE 12 (SUTURE) IMPLANT
SUT VIC AB 2-0 CT2 18 VCP726D (SUTURE) ×2 IMPLANT
SYR BULB IRRIGATION 50ML (SYRINGE) ×2 IMPLANT
SYR CONTROL 10ML LL (SYRINGE) ×4 IMPLANT
TAPE CLOTH 4X10 WHT NS (GAUZE/BANDAGES/DRESSINGS) ×2 IMPLANT
TAPE UMBILICAL COTTON 1/8X30 (MISCELLANEOUS) ×2 IMPLANT
TOWEL OR 17X24 6PK STRL BLUE (TOWEL DISPOSABLE) ×2 IMPLANT
TOWEL OR 17X26 10 PK STRL BLUE (TOWEL DISPOSABLE) ×2 IMPLANT
WATER STERILE IRR 1000ML POUR (IV SOLUTION) ×2 IMPLANT
YANKAUER SUCT BULB TIP NO VENT (SUCTIONS) ×2 IMPLANT

## 2016-05-05 NOTE — Progress Notes (Addendum)
Pharmacy Antibiotic Note  Katelyn Barnes is a 36 y.o. female admitted on 05/05/2016 with right arm pain, now s/p decompression.  Pharmacy has been consulted for vancomycin dosing for surgical prophylaxis.  Patient does not have a drain and will only receive vancomycin x 1 dose 12 hours post perioperative dose.  Baseline labs reviewed.   Plan: - Vanc 1gm IV x 1 tomorrow AM   Weight: 271 lb (122.9 kg)  Temp (24hrs), Avg:97.8 F (36.6 C), Min:97.5 F (36.4 C), Max:98.2 F (36.8 C)   Recent Labs Lab 05/03/16 1131  WBC 6.7  CREATININE 0.49    Estimated Creatinine Clearance: 130 mL/min (by C-G formula based on SCr of 0.49 mg/dL).    Allergies  Allergen Reactions  . Penicillins     UNSPECIFIED REACTION      Florence Yeung D. Laney Potash, PharmD, BCPS Pager:  [phone] 05/05/2016, 5:24 PM

## 2016-05-05 NOTE — Anesthesia Preprocedure Evaluation (Signed)
Anesthesia Evaluation  Patient identified by MRN, date of birth, ID band Patient awake    Reviewed: Allergy & Precautions, H&P , Patient's Chart, lab work & pertinent test results, reviewed documented beta blocker date and time   Airway Mallampati: II  TM Distance: >3 FB Neck ROM: full    Dental no notable dental hx.    Pulmonary    Pulmonary exam normal breath sounds clear to auscultation       Cardiovascular  Rhythm:regular Rate:Normal     Neuro/Psych    GI/Hepatic   Endo/Other  diabetesMorbid obesity  Renal/GU      Musculoskeletal   Abdominal   Peds  Hematology   Anesthesia Other Findings   Reproductive/Obstetrics                             Anesthesia Physical Anesthesia Plan  ASA: III  Anesthesia Plan: General   Post-op Pain Management:    Induction: Intravenous  Airway Management Planned: Oral ETT and Video Laryngoscope Planned  Additional Equipment:   Intra-op Plan:   Post-operative Plan: Extubation in OR  Informed Consent: I have reviewed the patients History and Physical, chart, labs and discussed the procedure including the risks, benefits and alternatives for the proposed anesthesia with the patient or authorized representative who has indicated his/her understanding and acceptance.   Dental Advisory Given  Plan Discussed with: CRNA and Surgeon  Anesthesia Plan Comments: (  )        Anesthesia Quick Evaluation

## 2016-05-05 NOTE — H&P (Signed)
     PREOPERATIVE H&P  Chief Complaint: Right arm pain  HPI: Katelyn Barnes is a 36 y.o. female who presents with ongoing pain in the right arm  MRI reveals a moderate right C7/T1 HNP, compressing the R C8 nerve  Patient has failed multiple forms of conservative care and continues to have pain (see office notes for additional details regarding the patient's full course of treatment)  Past Medical History:  Diagnosis Date  . DDD (degenerative disc disease), lumbar   . Diabetes mellitus without complication (HCC)    Type II  . DVT (deep venous thrombosis) (HCC) 2016   post back surgery - was on Eliquis 3 months   Past Surgical History:  Procedure Laterality Date  . BACK SURGERY  2016   discectomy   . CARPAL TUNNEL RELEASE Bilateral   . TUBAL LIGATION  08/2007   Social History   Social History  . Marital status: Legally Separated    Spouse name: N/A  . Number of children: N/A  . Years of education: N/A   Social History Main Topics  . Smoking status: Never Smoker  . Smokeless tobacco: Never Used  . Alcohol use No  . Drug use: No  . Sexual activity: Not on file   Other Topics Concern  . Not on file   Social History Narrative  . No narrative on file   No family history on file. Allergies  Allergen Reactions  . Penicillins     UNSPECIFIED REACTION    Prior to Admission medications   Medication Sig Start Date End Date Taking? Authorizing Provider  atorvastatin (LIPITOR) 40 MG tablet Take 40 mg by mouth every evening.   Yes Historical Provider, MD  cyclobenzaprine (FLEXERIL) 5 MG tablet Take 5-10 mg by mouth at bedtime.   Yes Historical Provider, MD  gabapentin (NEURONTIN) 300 MG capsule Take 1,200 mg by mouth at bedtime.   Yes Historical Provider, MD  insulin aspart protamine- aspart (NOVOLOG MIX 70/30) (70-30) 100 UNIT/ML injection Inject 75-85 Units into the skin 2 (two) times daily with a meal. 85 units in the morning, 75 units at bedtime   Yes Historical  Provider, MD  liraglutide (VICTOZA) 18 MG/3ML SOPN Inject 1.8 mg into the skin every morning.   Yes Historical Provider, MD  metFORMIN (GLUCOPHAGE) 1000 MG tablet Take 1,000 mg by mouth 2 (two) times daily with a meal.   Yes Historical Provider, MD  methocarbamol (ROBAXIN) 500 MG tablet Take 500 mg by mouth 2 (two) times daily.   Yes Historical Provider, MD  meloxicam (MOBIC) 15 MG tablet Take 15 mg by mouth daily.    Historical Provider, MD     All other systems have been reviewed and were otherwise negative with the exception of those mentioned in the HPI and as above.  Physical Exam: There were no vitals filed for this visit.  General: Alert, no acute distress Cardiovascular: No pedal edema Respiratory: No cyanosis, no use of accessory musculature Skin: No lesions in the area of chief complaint Neurologic: Sensation intact distally Psychiatric: Patient is competent for consent with normal mood and affect Lymphatic: No axillary or cervical lymphadenopathy  MUSCULOSKELETAL: + spurling's sign on the right  Assessment/Plan: Right arm pain Plan for Procedure(s): ANTERIOR CERVICAL DECOMPRESSION FUSION, CERVICAL 7- THORACIC 1 WITH INSTRUMENTATION AND ALLOGRAFT   Emilee Hero, MD 05/05/2016 7:06 AM

## 2016-05-05 NOTE — Transfer of Care (Signed)
Immediate Anesthesia Transfer of Care Note  Patient: Katelyn Barnes  Procedure(s) Performed: Procedure(s) with comments: ANTERIOR CERVICAL DECOMPRESSION FUSION, CERVICAL 7- THORACIC 1 WITH INSTRUMENTATION AND ALLOGRAFT (Right) - ANTERIOR CERVICAL DECOMPRESSION FUSION, CERVICAL 7- THORACIC 1 WITH INSTRUMENTATION AND ALLOGRAFT  Patient Location: PACU  Anesthesia Type:General  Level of Consciousness: awake, alert , oriented and patient cooperative  Airway & Oxygen Therapy: Patient Spontanous Breathing and Patient connected to nasal cannula oxygen  Post-op Assessment: Report given to RN, Post -op Vital signs reviewed and stable and Patient moving all extremities  Post vital signs: Reviewed and stable  Last Vitals:  Vitals:   05/05/16 1034  BP: 127/84  Pulse: 90  Resp: 20  Temp: 36.6 C    Last Pain:  Vitals:   05/05/16 1034  TempSrc: Oral         Complications: No apparent anesthesia complications

## 2016-05-06 LAB — GLUCOSE, CAPILLARY: GLUCOSE-CAPILLARY: 101 mg/dL — AB (ref 65–99)

## 2016-05-06 MED FILL — Thrombin For Soln 20000 Unit: CUTANEOUS | Qty: 1 | Status: AC

## 2016-05-06 NOTE — Progress Notes (Signed)
    Patient doing well Patient reports resolved R arm pain   Physical Exam: Vitals:   05/05/16 2334 05/06/16 0406  BP: 126/75 105/84  Pulse: (!) 101 93  Resp: 20 20  Temp: 98.7 F (37.1 C) 98 F (36.7 C)    Dressing in place NVI Neck soft/supple  POD #1 s/p C7/T1 ACDF, doing well  - encourage ambulation - Percocet for pain, Valium for muscle spasms - likely d/c home today

## 2016-05-06 NOTE — Op Note (Signed)
NAME:  TIFFINEY, SPARROW                  ACCOUNT NO.:  MEDICAL RECORD NO.:  [id number]  LOCATION:                                 FACILITY:  PHYSICIAN:  Estill Bamberg, MD           DATE OF BIRTH:  DATE OF PROCEDURE:  05/05/2016                              OPERATIVE REPORT   PREOPERATIVE DIAGNOSIS: 1. Right-sided C8 radiculopathy. 2. 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  POSTOPERATIVE DIAGNOSIS: 1. Right-sided C8 radiculopathy. 2. 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  PROCEDURE: 1. Anterior cervical decompression and fusion, C7/T1. 2. Placement of anterior instrumentation, C7/T1. 3. Insertion of interbody device x1 (Titan intervertebral spacer, 7     mm). 4. Use of morselized allograft - ViviGen. 5. Intraoperative use of fluoroscopy.  SURGEON:  Estill Bamberg, MD  ASSISTANT:  Jason Coop, PAC.  ANESTHESIA:  General endotracheal anesthesia.  COMPLICATIONS:  None.  DISPOSITION:  Stable.  ESTIMATED BLOOD LOSS:  Minimal.  INDICATIONS FOR SURGERY:  Briefly, Ms. Meadors is a pleasant 36 year old female who did sustain a work related injury on February 11, 2016.  She had subsequently developed pain in her neck and right arm.  She did present to me with symptoms consistent with right-sided cervical radiculopathy.  An MRI was obtained and did reveal a right-sided C7-T1 disk herniation, clearly compressing the right C8 nerve.  The patient did fail appropriate conservative treatment measures, and we did discuss proceeding with the procedure reflected above.  The patient was fully aware of the risks and limitations of surgery and did elect to proceed.  OPERATIVE DETAILS:  On May 05, 2016, the patient was brought to surgery and general endotracheal anesthesia was administered.  The patient's arms were secured to her sides.  The patient's shoulders were taped to the inferior aspect of the bed.  The patient's neck was  prepped and draped in the usual sterile fashion.  A time-out procedure was performed.  A left-sided transverse incision was made in line with the C7-T1 intervertebral space.  The platysma was sharply incised.  A plane between the sternocleidomastoid muscle and the strap muscles were identified and entered.  The carotid artery was retracted laterally. The anterior spine was readily noted.  Self-retaining retractors were placed.  A lateral intraoperative fluoroscopic image did confirm the appropriate operative level.  Caspar pins were then placed into the C7 and T1 vertebral bodies and distraction was applied.  I then used a knife followed by series of curettes to perform a thorough and complete C7-T1 intervertebral diskectomy.  The posterior longitudinal ligament was identified and entered.  I then used  #1 followed by #2 Kerrison to perform a thorough and complete bilateral neuroforaminal decompression. Of note, there was noted to be stenosis in the region of the right C7-T1 foramen, with compression of the right C8 nerve noted.  This compression was removed and the C8 nerve readily was thereby decompressed.  The wound was copiously irrigated.  I then placed a series of intervertebral spacer trials.  The appropriate size intervertebral spacer was then packed with the vagina and tamped into position in the usual fashion.  I was very pleased with the press-fit of the implant.  I then chose a 12 mm anterior plate, which was placed over the anterior spine.  A 14 mm screws were placed, 2 in each vertebral body at C7 and T1 for a total 4 screws.  The screws were then locked to the plate using the Cam locking mechanism.  I was very pleased with the final fluoroscopic images, however, was extremely difficult to visualize the C7-T1 intervertebral space on spot imaging, so I did use a live fluoroscopy to confirm that the appropriate operative level, to confirm that I did a diskectomy and fusion on  the appropriate operative level, at C7-T1.  The platysma was then closed using 2-0 Vicryl.  The skin was closed using 4-0 Monocryl. Benzoin and Steri-Strips were applied followed by sterile dressing.  All instrument counts were correct at the termination of the procedure.  Of note, Kate Sable was my assistant throughout the  surgery, and did aid in retraction,  suctioning, and closure from start to finish.     Estill Bamberg, MD     MD/MEDQ  D:  05/05/2016  T:  05/05/2016  Job:  [id number]

## 2016-05-06 NOTE — Progress Notes (Signed)
Pt given D/C instructions with Rx's, verbal understanding was provided. Pt's incision is clean and dry with no sign of infection. Pt's IV was removed prior to D/C. Pt D/C'd home via wheelchair @ 0945 per MD order. Pt is stable @ D/C and has no other needs at this time. Rema Fendt, RN

## 2016-05-07 ENCOUNTER — Encounter (HOSPITAL_COMMUNITY): Payer: Self-pay | Admitting: Orthopedic Surgery

## 2016-05-11 NOTE — Anesthesia Postprocedure Evaluation (Signed)
Anesthesia Post Note  Patient: Tondra Reierson  Procedure(s) Performed: Procedure(s) (LRB): ANTERIOR CERVICAL DECOMPRESSION FUSION, CERVICAL 7- THORACIC 1 WITH INSTRUMENTATION AND ALLOGRAFT (Right)  Patient location during evaluation: PACU Anesthesia Type: General Level of consciousness: awake and alert Pain management: pain level controlled Vital Signs Assessment: post-procedure vital signs reviewed and stable Respiratory status: spontaneous breathing, nonlabored ventilation, respiratory function stable and patient connected to nasal cannula oxygen Cardiovascular status: blood pressure returned to baseline and stable Postop Assessment: no signs of nausea or vomiting Anesthetic complications: no        Last Vitals:  Vitals:   05/06/16 0406 05/06/16 0802  BP: 105/84 (!) 140/97  Pulse: 93 93  Resp: 20 18  Temp: 36.7 C 36.6 C    Last Pain:  Vitals:   05/06/16 0745  TempSrc:   PainSc: 3    Pain Goal: Patients Stated Pain Goal: 2 (05/06/16 0745)               Jiles Garter

## 2016-05-19 NOTE — Discharge Summary (Signed)
Patient ID: Katelyn PrettyKristy Wilson Karim MRN: [id number] DOB/AGE: 37/07/1980 37 y.o.  Admit date: 05/05/2016 Discharge date: 05/06/2016  Admission Diagnoses:  Active Problems:   Radiculopathy   Discharge Diagnoses:  Same  Past Medical History:  Diagnosis Date  . DDD (degenerative disc disease), lumbar   . Diabetes mellitus without complication (HCC)    Type II  . DVT (deep venous thrombosis) (HCC) 2016   post back surgery - was on Eliquis 3 months    Surgeries: Procedure(s): ANTERIOR CERVICAL DECOMPRESSION FUSION, CERVICAL 7- THORACIC 1 WITH INSTRUMENTATION AND ALLOGRAFT on 05/05/2016   Consultants: None  Discharged Condition: Improved  Hospital Course: Katelyn Barnes is an 36 y.o. female who was admitted 05/05/2016 for operative treatment of radiculopathy. Patient has severe unremitting pain that affects sleep, daily activities, and work/hobbies. After pre-op clearance the patient was taken to the operating room on 05/05/2016 and underwent  Procedure(s): ANTERIOR CERVICAL DECOMPRESSION FUSION, CERVICAL 7- THORACIC 1 WITH INSTRUMENTATION AND ALLOGRAFT.    Patient was given perioperative antibiotics:  Anti-infectives    Start     Dose/Rate Route Frequency Ordered Stop   05/06/16 0030  vancomycin (VANCOCIN) IVPB 1000 mg/200 mL premix     1,000 mg 200 mL/hr over 60 Minutes Intravenous  Once 05/05/16 1722 05/06/16 0016   05/05/16 1200  vancomycin (VANCOCIN) 1,500 mg in sodium chloride 0.9 % 500 mL IVPB     1,500 mg 250 mL/hr over 120 Minutes Intravenous To [facility]Stay [facility] 05/04/16 0847 05/05/16 1320       Patient was given sequential compression devices, early ambulation to prevent DVT.  Patient benefited maximally from hospital stay and there were no complications.    Recent vital signs: BP (!) 140/97   Pulse 93   Temp 97.9 F (36.6 C)   Resp 18   Wt 122.9 kg (271 lb)   LMP  (LMP Unknown)   SpO2 96%   BMI 43.74 kg/m   Discharge Medications:   Allergies as of  05/06/2016      Reactions   Penicillins    UNSPECIFIED REACTION       Medication List    TAKE these medications   atorvastatin 40 MG tablet Commonly known as:  LIPITOR Take 40 mg by mouth every evening.   gabapentin 300 MG capsule Commonly known as:  NEURONTIN Take 1,200 mg by mouth at bedtime.   insulin aspart protamine- aspart (70-30) 100 UNIT/ML injection Commonly known as:  NOVOLOG MIX 70/30 Inject 75-85 Units into the skin 2 (two) times daily with a meal. 85 units in the morning, 75 units at bedtime   metFORMIN 1000 MG tablet Commonly known as:  GLUCOPHAGE Take 1,000 mg by mouth 2 (two) times daily with a meal.   VICTOZA 18 MG/3ML Sopn Generic drug:  liraglutide Inject 1.8 mg into the skin every morning.       Diagnostic Studies: Dg Chest 2 View  Result Date: 05/03/2016 CLINICAL DATA:  36 year old female preoperative study for cervical spine surgery. EXAM: CHEST  2 VIEW COMPARISON:  05/27/2013 and earlier. FINDINGS: Lung volumes remain within normal limits. Normal cardiac size and mediastinal contours. Visualized tracheal air column is within normal limits. The lungs remain clear. No pneumothorax or pleural effusion. Negative visible bowel gas pattern. No acute osseous abnormality identified. IMPRESSION: Negative.  No acute cardiopulmonary abnormality. Electronically Signed   By: Odessa FlemingH  Hall M.D.   On: 05/03/2016 13:54   Dg Cervical Spine 2-3 Views  Result Date: 05/05/2016 CLINICAL DATA:  Status post C7-T1 anterior cervical disc fusion. EXAM: DG C-ARM 61-120 MIN; CERVICAL SPINE - 2-3 VIEW FLUOROSCOPY TIME:  1 minutes 1 second. COMPARISON:  None. FINDINGS: Three lateral intraoperative fluoroscopic images were obtained of the cervical spine. The patient appears to be status post surgical anterior fusion of the lower cervical disc space, although the exact level cannot be determined from these images. Good alignment of vertebral bodies is noted. IMPRESSION: Status post surgical  anterior fusion of lower cervical spine. Images are somewhat limited due to body habitus. Electronically Signed   By: Lupita Raider, M.D.   On: 05/05/2016 15:16   Dg C-arm 1-60 Min  Result Date: 05/05/2016 CLINICAL DATA:  Status post C7-T1 anterior cervical disc fusion. EXAM: DG C-ARM 61-120 MIN; CERVICAL SPINE - 2-3 VIEW FLUOROSCOPY TIME:  1 minutes 1 second. COMPARISON:  None. FINDINGS: Three lateral intraoperative fluoroscopic images were obtained of the cervical spine. The patient appears to be status post surgical anterior fusion of the lower cervical disc space, although the exact level cannot be determined from these images. Good alignment of vertebral bodies is noted. IMPRESSION: Status post surgical anterior fusion of lower cervical spine. Images are somewhat limited due to body habitus. Electronically Signed   By: Lupita Raider, M.D.   On: 05/05/2016 15:16    Disposition: 01-Home or Self Care   POD #1 s/p C7/T1 ACDF, doing well  - encourage ambulation - Percocet for pain, Valium for muscle spasms -Written scripts for pain signed and in chart -D/C instructions sheet printed and in chart -D/C today  -F/U in office 2 weeks   Signed: Georga Bora 05/19/2016, 2:06 PM

## 2016-07-23 NOTE — Anesthesia Postprocedure Evaluation (Signed)
Anesthesia Post Note  Patient: Katelyn Barnes  Procedure(s) Performed: Procedure(s) (LRB): ANTERIOR CERVICAL DECOMPRESSION FUSION, CERVICAL 7- THORACIC 1 WITH INSTRUMENTATION AND ALLOGRAFT (Right)     Anesthesia Post Evaluation  Last Vitals:  Vitals:   05/06/16 0406 05/06/16 0802  BP: 105/84 (!) 140/97  Pulse: 93 93  Resp: 20 18  Temp: 36.7 C 36.6 C    Last Pain:  Vitals:   05/06/16 0745  TempSrc:   PainSc: 3                  [name] EDWARD

## 2016-07-23 NOTE — Addendum Note (Signed)
Addendum  created 07/23/16 1204 by Cristela BlueJackson, Smith Potenza, MD   Sign clinical note

## 2018-10-12 ENCOUNTER — Encounter: Payer: Self-pay | Admitting: Emergency Medicine

## 2018-10-12 ENCOUNTER — Other Ambulatory Visit: Payer: Self-pay

## 2018-10-12 ENCOUNTER — Emergency Department
Admission: EM | Admit: 2018-10-12 | Discharge: 2018-10-12 | Disposition: A | Discharge status: Home / Self Care | Payer: BC Managed Care – PPO | Attending: Emergency Medicine | Admitting: Emergency Medicine

## 2018-10-12 DIAGNOSIS — A6004 Herpesviral vulvovaginitis: Secondary | ICD-10-CM | POA: Insufficient documentation

## 2018-10-12 DIAGNOSIS — Z794 Long term (current) use of insulin: Secondary | ICD-10-CM | POA: Insufficient documentation

## 2018-10-12 DIAGNOSIS — E119 Type 2 diabetes mellitus without complications: Secondary | ICD-10-CM | POA: Insufficient documentation

## 2018-10-12 DIAGNOSIS — Z79899 Other long term (current) drug therapy: Secondary | ICD-10-CM | POA: Insufficient documentation

## 2018-10-12 MED ORDER — VALACYCLOVIR HCL 1 G PO TABS: 1000.0000 mg | ORAL_TABLET | Freq: Two times a day (BID) | ORAL | 0 refills | Status: DC

## 2018-10-12 MED ORDER — HYDROCODONE-ACETAMINOPHEN 5-325 MG PO TABS: 1.0000 | ORAL_TABLET | Freq: Four times a day (QID) | ORAL | 0 refills | Status: DC | PRN

## 2018-10-12 NOTE — ED Notes (Signed)
Patient says she has painful vaginal area.  Has pics on her phone.  White spots ? Ulcer or blisters in the pic.

## 2018-10-12 NOTE — Discharge Instructions (Signed)
Follow-up with your primary care provider if any continued problems.  Begin taking Valtrex twice a day for the next 7 days.  Also prescription for Norco was sent to your pharmacy for pain.  Do not drive or operate machinery while taking the pain medication.  You may also take ibuprofen if additional pain medication as needed.  You may also want to consider having testing done at the health department for other sexually transmitted diseases including hepatitis B.  This screening at the health department is cheaper than your primary care provider

## 2018-10-12 NOTE — ED Triage Notes (Signed)
Pt states vaginal pain that began 2 nights ago, states pustules on labia, making sitting painful NAD.

## 2018-10-12 NOTE — ED Provider Notes (Signed)
[facility] Emergency Department Provider Note  ____________________________________________   First MD Initiated Contact with Patient 10/12/18 1312     (approximate)  I have reviewed the triage vital signs and the nursing notes.   HISTORY  Chief Complaint Vaginal Pain   HPI Katelyn Barnes is a 38 y.o. female presents to the ED with complaint of painful lesions around the vaginal area.  Patient states that she noticed this 2 nights ago.  She states that sitting is painful.  She denies any vaginal discharge, fever, chills, nausea or vomiting.  She denies any previous genital warts or herpes.  She rates her pain as 7 out of 10.      Past Medical History:  Diagnosis Date  . DDD (degenerative disc disease), lumbar   . Diabetes mellitus without complication ([place])    Type II  . DVT (deep venous thrombosis) ([place]) 2016   post back surgery - was on Eliquis 3 months    Patient Active Problem List   Diagnosis Date Noted  . Radiculopathy 05/05/2016    Past Surgical History:  Procedure Laterality Date  . ANTERIOR CERVICAL DECOMP/DISCECTOMY FUSION Right 05/05/2016   Procedure: ANTERIOR CERVICAL DECOMPRESSION FUSION, CERVICAL 7- THORACIC 1 WITH INSTRUMENTATION AND ALLOGRAFT;  Surgeon: Phylliss Bob, MD;  Location: [place];  Service: Orthopedics;  Laterality: Right;  ANTERIOR CERVICAL DECOMPRESSION FUSION, CERVICAL 7- THORACIC 1 WITH INSTRUMENTATION AND ALLOGRAFT  . BACK SURGERY  2016   discectomy   . CARPAL TUNNEL RELEASE Bilateral   . TUBAL LIGATION  08/2007    Prior to Admission medications   Medication Sig Start Date End Date Taking? Authorizing Provider  atorvastatin (LIPITOR) 40 MG tablet Take 40 mg by mouth every evening.    [provider]  gabapentin (NEURONTIN) 300 MG capsule Take 1,200 mg by mouth at bedtime.    [provider]  HYDROcodone-acetaminophen (NORCO/VICODIN) 5-325 MG tablet Take 1 tablet by mouth every 6 (six)  hours as needed for moderate pain. 10/12/18   Johnn Hai, PA-C  insulin aspart protamine- aspart (NOVOLOG MIX 70/30) (70-30) 100 UNIT/ML injection Inject 75-85 Units into the skin 2 (two) times daily with a meal. 85 units in the morning, 75 units at bedtime    [provider]  liraglutide (VICTOZA) 18 MG/3ML SOPN Inject 1.8 mg into the skin every morning.    [provider]  metFORMIN (GLUCOPHAGE) 1000 MG tablet Take 1,000 mg by mouth 2 (two) times daily with a meal.    [provider]  valACYclovir (VALTREX) 1000 MG tablet Take 1 tablet (1,000 mg total) by mouth 2 (two) times daily. 10/12/18   Johnn Hai, PA-C    Allergies Penicillins  No family history on file.  Social History Social History   Tobacco Use  . Smoking status: Never Smoker  . Smokeless tobacco: Never Used  Substance Use Topics  . Alcohol use: No  . Drug use: No    Review of Systems Constitutional: No fever/chills Cardiovascular: Denies chest pain. Respiratory: Denies shortness of breath. Genitourinary: Negative for dysuria.  Positive genital pain and tenderness. Musculoskeletal: Negative for muscle aches. Skin: Negative for rash. Neurological: Negative for headaches ____________________________________________   PHYSICAL EXAM:  VITAL SIGNS: ED Triage Vitals  Enc Vitals Group     BP 10/12/18 1240 108/85     Pulse Rate 10/12/18 1240 (!) 115     Resp 10/12/18 1240 16     Temp 10/12/18 1240 98 F (36.7 C)  Temp Source 10/12/18 1240 Oral     SpO2 10/12/18 1240 99 %     Weight 10/12/18 1236 250 lb (113.4 kg)     Height 10/12/18 1236 5\' 6"  (1.676 m)     Head Circumference --      Peak Flow --      Pain Score 10/12/18 1236 7     Pain Loc --      Pain Edu? --      Excl. in [place]? --    Constitutional: Alert and oriented. Well appearing and in no acute distress. Eyes: Conjunctivae are normal.  Head: Atraumatic. Neck: No stridor.   Cardiovascular:  Good peripheral  circulation. Respiratory: Normal respiratory effort.  No retractions.  Genitourinary: External genitalia has a scattered number of vesicles with erythematous bases.  No vaginal discharge is noted.  Area is extremely tender to touch.  A culture was obtained. Neurologic:  Normal speech and language. No gross focal neurologic deficits are appreciated. No gait instability.  Psychiatric: Mood and affect are normal. Speech and behavior are normal.  ____________________________________________   LABS (all labs ordered are listed, but only abnormal results are displayed)  Labs Reviewed  HSV CULTURE AND TYPING     PROCEDURES  Procedure(s) performed (including Critical Care):  Procedures   ____________________________________________   INITIAL IMPRESSION / ASSESSMENT AND PLAN / ED COURSE  As part of my medical decision making, I reviewed the following data within the electronic MEDICAL RECORD NUMBER Notes from prior ED visits and [place] Controlled Substance Database  38 year old female presents to the ED with complaint of external genitalia pain that started 2 days ago.  Patient states that she noticed bumps that were painful in that area.  She states that sitting has made it more painful.  She denies any vaginal discharge.  On exam there is suspicion for herpes genitalia.  Culture was obtained and patient was made aware.  She is placed on Valtrex thousand milligrams twice a day for the next 7 days.  She was also given a prescription for Norco if needed for pain.  She was encouraged to follow-up with the health department should she wish to have test done for other STDs.  ____________________________________________   FINAL CLINICAL IMPRESSION(S) / ED DIAGNOSES  Final diagnoses:  Herpes simplex vulvovaginitis     ED Discharge Orders         Ordered    valACYclovir (VALTREX) 1000 MG tablet  2 times daily     10/12/18 1401    HYDROcodone-acetaminophen (NORCO/VICODIN) 5-325 MG tablet  Every  6 hours PRN     10/12/18 1401           Note:  This document was prepared using Dragon voice recognition software and may include unintentional dictation errors.    12/12/18, PA-C 10/12/18 1438    12/12/18, MD 10/12/18 1452

## 2018-10-15 LAB — HSV CULTURE AND TYPING

## 2018-10-16 ENCOUNTER — Telehealth: Payer: Self-pay | Admitting: Emergency Medicine

## 2018-10-16 NOTE — Telephone Encounter (Signed)
Called patient to give hsv results.  Gave her result  (hsv  Type 1) and and instructed to follow up with pcp.

## 2019-06-11 ENCOUNTER — Inpatient Hospital Stay: Payer: Medicaid Other | Admitting: Anesthesiology

## 2019-06-11 ENCOUNTER — Encounter: Payer: Self-pay | Admitting: Emergency Medicine

## 2019-06-11 ENCOUNTER — Inpatient Hospital Stay
Admission: EM | Admit: 2019-06-11 | Discharge: 2019-06-16 | DRG: 853 | Disposition: A | Discharge status: Home / Self Care | Payer: Medicaid Other | Attending: Internal Medicine | Admitting: Internal Medicine

## 2019-06-11 ENCOUNTER — Encounter
Admission: EM | Disposition: A | Discharge status: Home / Self Care | Payer: Self-pay | Source: Home / Self Care | Attending: Pulmonary Disease

## 2019-06-11 ENCOUNTER — Emergency Department: Payer: Medicaid Other

## 2019-06-11 ENCOUNTER — Other Ambulatory Visit: Payer: Self-pay

## 2019-06-11 DIAGNOSIS — R739 Hyperglycemia, unspecified: Secondary | ICD-10-CM

## 2019-06-11 DIAGNOSIS — Z794 Long term (current) use of insulin: Secondary | ICD-10-CM | POA: Diagnosis not present

## 2019-06-11 DIAGNOSIS — J9601 Acute respiratory failure with hypoxia: Secondary | ICD-10-CM | POA: Diagnosis not present

## 2019-06-11 DIAGNOSIS — Z880 Allergy status to penicillin: Secondary | ICD-10-CM | POA: Diagnosis not present

## 2019-06-11 DIAGNOSIS — A419 Sepsis, unspecified organism: Secondary | ICD-10-CM | POA: Diagnosis present

## 2019-06-11 DIAGNOSIS — R6521 Severe sepsis with septic shock: Secondary | ICD-10-CM | POA: Diagnosis present

## 2019-06-11 DIAGNOSIS — N1330 Unspecified hydronephrosis: Secondary | ICD-10-CM

## 2019-06-11 DIAGNOSIS — Z86718 Personal history of other venous thrombosis and embolism: Secondary | ICD-10-CM

## 2019-06-11 DIAGNOSIS — Z6837 Body mass index (BMI) 37.0-37.9, adult: Secondary | ICD-10-CM

## 2019-06-11 DIAGNOSIS — R1032 Left lower quadrant pain: Secondary | ICD-10-CM | POA: Diagnosis present

## 2019-06-11 DIAGNOSIS — Z20822 Contact with and (suspected) exposure to covid-19: Secondary | ICD-10-CM | POA: Diagnosis present

## 2019-06-11 DIAGNOSIS — N12 Tubulo-interstitial nephritis, not specified as acute or chronic: Secondary | ICD-10-CM

## 2019-06-11 DIAGNOSIS — N136 Pyonephrosis: Secondary | ICD-10-CM | POA: Diagnosis present

## 2019-06-11 DIAGNOSIS — N179 Acute kidney failure, unspecified: Secondary | ICD-10-CM | POA: Diagnosis present

## 2019-06-11 DIAGNOSIS — D649 Anemia, unspecified: Secondary | ICD-10-CM | POA: Diagnosis present

## 2019-06-11 DIAGNOSIS — Z981 Arthrodesis status: Secondary | ICD-10-CM

## 2019-06-11 DIAGNOSIS — N131 Hydronephrosis with ureteral stricture, not elsewhere classified: Secondary | ICD-10-CM

## 2019-06-11 DIAGNOSIS — J9600 Acute respiratory failure, unspecified whether with hypoxia or hypercapnia: Secondary | ICD-10-CM

## 2019-06-11 DIAGNOSIS — A4151 Sepsis due to Escherichia coli [E. coli]: Secondary | ICD-10-CM | POA: Diagnosis present

## 2019-06-11 DIAGNOSIS — E662 Morbid (severe) obesity with alveolar hypoventilation: Secondary | ICD-10-CM | POA: Diagnosis present

## 2019-06-11 DIAGNOSIS — E1110 Type 2 diabetes mellitus with ketoacidosis without coma: Secondary | ICD-10-CM | POA: Diagnosis present

## 2019-06-11 HISTORY — PX: CYSTOSCOPY WITH URETEROSCOPY AND STENT PLACEMENT: SHX6377

## 2019-06-11 LAB — COMPREHENSIVE METABOLIC PANEL
ALT: 27 U/L (ref 0–44)
AST: 39 U/L (ref 15–41)
Albumin: 3.5 g/dL (ref 3.5–5.0)
Alkaline Phosphatase: 69 U/L (ref 38–126)
Anion gap: 16 — ABNORMAL HIGH (ref 5–15)
BUN: 27 mg/dL — ABNORMAL HIGH (ref 6–20)
CO2: 15 mmol/L — ABNORMAL LOW (ref 22–32)
Calcium: 8.3 mg/dL — ABNORMAL LOW (ref 8.9–10.3)
Chloride: 99 mmol/L (ref 98–111)
Creatinine, Ser: 2.4 mg/dL — ABNORMAL HIGH (ref 0.44–1.00)
GFR calc Af Amer: 28 mL/min — ABNORMAL LOW (ref >60)
GFR calc non Af Amer: 24 mL/min — ABNORMAL LOW (ref >60)
Glucose, Bld: 427 mg/dL — ABNORMAL HIGH (ref 70–99)
Potassium: 4.4 mmol/L (ref 3.5–5.1)
Sodium: 130 mmol/L — ABNORMAL LOW (ref 135–145)
Total Bilirubin: 2.8 mg/dL — ABNORMAL HIGH (ref 0.3–1.2)
Total Protein: 7.1 g/dL (ref 6.5–8.1)

## 2019-06-11 LAB — BASIC METABOLIC PANEL
Anion gap: 11 (ref 5–15)
Anion gap: 14 (ref 5–15)
BUN: 29 mg/dL — ABNORMAL HIGH (ref 6–20)
BUN: 28 mg/dL — ABNORMAL HIGH (ref 6–20)
CO2: 18 mmol/L — ABNORMAL LOW (ref 22–32)
CO2: 13 mmol/L — ABNORMAL LOW (ref 22–32)
Calcium: 7.3 mg/dL — ABNORMAL LOW (ref 8.9–10.3)
Calcium: 7.6 mg/dL — ABNORMAL LOW (ref 8.9–10.3)
Chloride: 102 mmol/L (ref 98–111)
Chloride: 106 mmol/L (ref 98–111)
Creatinine, Ser: 2.11 mg/dL — ABNORMAL HIGH (ref 0.44–1.00)
Creatinine, Ser: 2.02 mg/dL — ABNORMAL HIGH (ref 0.44–1.00)
GFR calc Af Amer: 33 mL/min — ABNORMAL LOW (ref >60)
GFR calc Af Amer: 35 mL/min — ABNORMAL LOW (ref >60)
GFR calc non Af Amer: 29 mL/min — ABNORMAL LOW (ref >60)
GFR calc non Af Amer: 30 mL/min — ABNORMAL LOW (ref >60)
Glucose, Bld: 412 mg/dL — ABNORMAL HIGH (ref 70–99)
Glucose, Bld: 280 mg/dL — ABNORMAL HIGH (ref 70–99)
Potassium: 3.6 mmol/L (ref 3.5–5.1)
Potassium: 3.8 mmol/L (ref 3.5–5.1)
Sodium: 131 mmol/L — ABNORMAL LOW (ref 135–145)
Sodium: 133 mmol/L — ABNORMAL LOW (ref 135–145)

## 2019-06-11 LAB — URINALYSIS, COMPLETE (UACMP) WITH MICROSCOPIC
Bilirubin Urine: NEGATIVE
Glucose, UA: >=500 mg/dL — AB
Ketones, ur: 5 mg/dL — AB
Nitrite: NEGATIVE
Protein, ur: 100 mg/dL — AB
Specific Gravity, Urine: 1.015 (ref 1.005–1.030)
pH: 5 (ref 5.0–8.0)

## 2019-06-11 LAB — BETA-HYDROXYBUTYRIC ACID: Beta-Hydroxybutyric Acid: 0.57 mmol/L — ABNORMAL HIGH (ref 0.05–0.27)

## 2019-06-11 LAB — CBC
HCT: 39.1 % (ref 36.0–46.0)
Hemoglobin: 13.4 g/dL (ref 12.0–15.0)
MCH: 28.3 pg (ref 26.0–34.0)
MCHC: 34.3 g/dL (ref 30.0–36.0)
MCV: 82.7 fL (ref 80.0–100.0)
Platelets: 93 10*3/uL — ABNORMAL LOW (ref 150–400)
RBC: 4.73 MIL/uL (ref 3.87–5.11)
RDW: 13.9 % (ref 11.5–15.5)
WBC: 10.4 10*3/uL (ref 4.0–10.5)
nRBC: 0 % (ref 0.0–0.2)

## 2019-06-11 LAB — BLOOD CULTURE ID PANEL (REFLEXED)

## 2019-06-11 LAB — LIPASE, BLOOD: Lipase: 16 U/L (ref 11–51)

## 2019-06-11 LAB — GLUCOSE, CAPILLARY
Glucose-Capillary: 419 mg/dL — ABNORMAL HIGH (ref 70–99)
Glucose-Capillary: 385 mg/dL — ABNORMAL HIGH (ref 70–99)
Glucose-Capillary: 353 mg/dL — ABNORMAL HIGH (ref 70–99)
Glucose-Capillary: 331 mg/dL — ABNORMAL HIGH (ref 70–99)
Glucose-Capillary: 298 mg/dL — ABNORMAL HIGH (ref 70–99)
Glucose-Capillary: 250 mg/dL — ABNORMAL HIGH (ref 70–99)
Glucose-Capillary: 274 mg/dL — ABNORMAL HIGH (ref 70–99)
Glucose-Capillary: 220 mg/dL — ABNORMAL HIGH (ref 70–99)
Glucose-Capillary: 228 mg/dL — ABNORMAL HIGH (ref 70–99)
Glucose-Capillary: 217 mg/dL — ABNORMAL HIGH (ref 70–99)

## 2019-06-11 LAB — HCG, QUANTITATIVE, PREGNANCY: hCG, Beta Chain, Quant, S: 1 m[IU]/mL (ref <5)

## 2019-06-11 LAB — TROPONIN I (HIGH SENSITIVITY)
Troponin I (High Sensitivity): 11 ng/L (ref <18)
Troponin I (High Sensitivity): 10 ng/L (ref <18)

## 2019-06-11 LAB — LACTIC ACID, PLASMA
Lactic Acid, Venous: 3.8 mmol/L (ref 0.5–1.9)
Lactic Acid, Venous: 3.5 mmol/L (ref 0.5–1.9)

## 2019-06-11 LAB — HIV ANTIBODY (ROUTINE TESTING W REFLEX): HIV Screen 4th Generation wRfx: NONREACTIVE

## 2019-06-11 LAB — SARS CORONAVIRUS 2 BY RT PCR (HOSPITAL ORDER, PERFORMED IN ~~LOC~~ HOSPITAL LAB): SARS Coronavirus 2: NEGATIVE

## 2019-06-11 LAB — HEMOGLOBIN A1C
Hgb A1c MFr Bld: 13.2 % — ABNORMAL HIGH (ref 4.8–5.6)
Mean Plasma Glucose: 332.14 mg/dL

## 2019-06-11 SURGERY — CYSTOURETEROSCOPY, WITH STENT INSERTION
Anesthesia: General | Laterality: Left

## 2019-06-11 MED ORDER — LEVOFLOXACIN IN D5W 750 MG/150ML IV SOLN
750.0000 mg | Freq: Once | INTRAVENOUS | Status: AC
  Administered 2019-06-11: 750 mg via INTRAVENOUS
  Filled 2019-06-11: qty 150

## 2019-06-11 MED ORDER — SODIUM CHLORIDE 0.9 % IV SOLN
1.0000 g | Freq: Two times a day (BID) | INTRAVENOUS | Status: DC
  Administered 2019-06-12 – 2019-06-13 (×3): 1 g via INTRAVENOUS
  Filled 2019-06-11 (×7): qty 1

## 2019-06-11 MED ORDER — SODIUM CHLORIDE 0.9 % IV SOLN
1.0000 g | Freq: Two times a day (BID) | INTRAVENOUS | Status: DC
  Filled 2019-06-11: qty 1

## 2019-06-11 MED ORDER — VASOPRESSIN 20 UNIT/ML IV SOLN
INTRAVENOUS | Status: DC | PRN
Start: 2019-06-11 — End: 2019-06-11
  Administered 2019-06-11: 2 [IU] via INTRAVENOUS
  Administered 2019-06-11 (×2): 1 [IU] via INTRAVENOUS

## 2019-06-11 MED ORDER — MORPHINE SULFATE (PF) 2 MG/ML IV SOLN
1.0000 mg | INTRAVENOUS | Status: DC | PRN
  Administered 2019-06-11 – 2019-06-15 (×16): 1 mg via INTRAVENOUS
  Filled 2019-06-11 (×16): qty 1

## 2019-06-11 MED ORDER — CHLORHEXIDINE GLUCONATE CLOTH 2 % EX PADS: 6.0000 | MEDICATED_PAD | Freq: Every day | CUTANEOUS | Status: DC

## 2019-06-11 MED ORDER — PROPOFOL 10 MG/ML IV BOLUS
INTRAVENOUS | Status: DC | PRN
  Administered 2019-06-11: 150 mg via INTRAVENOUS

## 2019-06-11 MED ORDER — LACTATED RINGERS IV SOLN: INTRAVENOUS | Status: DC | PRN

## 2019-06-11 MED ORDER — SODIUM CHLORIDE 0.9 % IV BOLUS
250.0000 mL | Freq: Once | INTRAVENOUS | Status: AC
  Administered 2019-06-11: 250 mL via INTRAVENOUS

## 2019-06-11 MED ORDER — FENTANYL CITRATE (PF) 100 MCG/2ML IJ SOLN
50.0000 ug | Freq: Once | INTRAMUSCULAR | Status: AC
  Administered 2019-06-11: 50 ug via INTRAVENOUS
  Filled 2019-06-11: qty 2

## 2019-06-11 MED ORDER — SODIUM CHLORIDE 0.9 % IV BOLUS
1000.0000 mL | Freq: Once | INTRAVENOUS | Status: AC
  Administered 2019-06-11: 1000 mL via INTRAVENOUS

## 2019-06-11 MED ORDER — GABAPENTIN 400 MG PO CAPS
1200.0000 mg | ORAL_CAPSULE | Freq: Every day | ORAL | Status: DC
  Administered 2019-06-12 – 2019-06-15 (×4): 1200 mg via ORAL
  Filled 2019-06-11 (×4): qty 3

## 2019-06-11 MED ORDER — EPINEPHRINE 1 MG/10ML IJ SOSY
PREFILLED_SYRINGE | INTRAMUSCULAR | Status: DC | PRN
  Administered 2019-06-11: .01 mg via INTRAVENOUS

## 2019-06-11 MED ORDER — ACETAMINOPHEN 650 MG RE SUPP: 650.0000 mg | Freq: Four times a day (QID) | RECTAL | Status: DC | PRN

## 2019-06-11 MED ORDER — FENTANYL CITRATE (PF) 100 MCG/2ML IJ SOLN
INTRAMUSCULAR | Status: AC
  Administered 2019-06-11: 25 ug via INTRAVENOUS
  Filled 2019-06-11: qty 2

## 2019-06-11 MED ORDER — POTASSIUM CHLORIDE 10 MEQ/100ML IV SOLN
10.0000 meq | INTRAVENOUS | Status: AC
  Administered 2019-06-11 (×2): 10 meq via INTRAVENOUS
  Filled 2019-06-11 (×2): qty 100

## 2019-06-11 MED ORDER — SUGAMMADEX SODIUM 200 MG/2ML IV SOLN
INTRAVENOUS | Status: DC | PRN
  Administered 2019-06-11: 200 mg via INTRAVENOUS

## 2019-06-11 MED ORDER — ACETAMINOPHEN 325 MG PO TABS
650.0000 mg | ORAL_TABLET | Freq: Four times a day (QID) | ORAL | Status: DC | PRN
  Administered 2019-06-11 – 2019-06-12 (×3): 650 mg via ORAL
  Filled 2019-06-11 (×3): qty 2

## 2019-06-11 MED ORDER — ONDANSETRON HCL 4 MG/2ML IJ SOLN
4.0000 mg | Freq: Four times a day (QID) | INTRAMUSCULAR | Status: DC | PRN
  Administered 2019-06-11 – 2019-06-12 (×3): 4 mg via INTRAVENOUS
  Filled 2019-06-11 (×3): qty 2

## 2019-06-11 MED ORDER — DEXTROSE 50 % IV SOLN: 0.0000 mL | INTRAVENOUS | Status: DC | PRN

## 2019-06-11 MED ORDER — FENTANYL CITRATE (PF) 100 MCG/2ML IJ SOLN
25.0000 ug | INTRAMUSCULAR | Status: DC | PRN
  Administered 2019-06-11 (×3): 25 ug via INTRAVENOUS

## 2019-06-11 MED ORDER — OXYCODONE HCL 5 MG PO TABS: 5.0000 mg | ORAL_TABLET | Freq: Once | ORAL | Status: DC | PRN

## 2019-06-11 MED ORDER — ROCURONIUM BROMIDE 100 MG/10ML IV SOLN
INTRAVENOUS | Status: DC | PRN
  Administered 2019-06-11: 20 mg via INTRAVENOUS

## 2019-06-11 MED ORDER — LIDOCAINE HCL (CARDIAC) PF 100 MG/5ML IV SOSY
PREFILLED_SYRINGE | INTRAVENOUS | Status: DC | PRN
  Administered 2019-06-11: 100 mg via INTRAVENOUS

## 2019-06-11 MED ORDER — PHENYLEPHRINE HCL (PRESSORS) 10 MG/ML IV SOLN
INTRAVENOUS | Status: DC | PRN
  Administered 2019-06-11 (×3): 200 ug via INTRAVENOUS

## 2019-06-11 MED ORDER — ONDANSETRON HCL 4 MG PO TABS: 4.0000 mg | ORAL_TABLET | Freq: Four times a day (QID) | ORAL | Status: DC | PRN

## 2019-06-11 MED ORDER — SODIUM CHLORIDE 0.9 % IV SOLN
2.0000 g | Freq: Two times a day (BID) | INTRAVENOUS | Status: DC
  Administered 2019-06-11: 2 g via INTRAVENOUS
  Filled 2019-06-11 (×4): qty 2

## 2019-06-11 MED ORDER — DEXTROSE-NACL 5-0.45 % IV SOLN: INTRAVENOUS | Status: DC

## 2019-06-11 MED ORDER — SODIUM CHLORIDE 0.9 % IV SOLN: INTRAVENOUS | Status: DC

## 2019-06-11 MED ORDER — OXYCODONE HCL 5 MG/5ML PO SOLN: 5.0000 mg | Freq: Once | ORAL | Status: DC | PRN

## 2019-06-11 MED ORDER — SODIUM CHLORIDE 0.9 % IV SOLN: 2.0000 g | Freq: Once | INTRAVENOUS | Status: DC

## 2019-06-11 MED ORDER — ONDANSETRON HCL 4 MG/2ML IJ SOLN: 4.0000 mg | Freq: Once | INTRAMUSCULAR | Status: DC | PRN

## 2019-06-11 MED ORDER — SODIUM CHLORIDE 0.9 % IV SOLN
2.0000 g | Freq: Two times a day (BID) | INTRAVENOUS | Status: DC
  Filled 2019-06-11: qty 2

## 2019-06-11 MED ORDER — INSULIN REGULAR(HUMAN) IN NACL 100-0.9 UT/100ML-% IV SOLN
INTRAVENOUS | Status: DC
  Administered 2019-06-11: 3.4 [IU]/h via INTRAVENOUS
  Administered 2019-06-11: 5 [IU]/h via INTRAVENOUS
  Administered 2019-06-11: 2.2 [IU]/h via INTRAVENOUS
  Administered 2019-06-12: 3.2 [IU]/h via INTRAVENOUS
  Filled 2019-06-11 (×2): qty 100

## 2019-06-11 MED ORDER — SUCCINYLCHOLINE CHLORIDE 20 MG/ML IJ SOLN
INTRAMUSCULAR | Status: DC | PRN
  Administered 2019-06-11: 120 mg via INTRAVENOUS

## 2019-06-11 MED ORDER — FENTANYL CITRATE (PF) 100 MCG/2ML IJ SOLN
INTRAMUSCULAR | Status: DC | PRN
  Administered 2019-06-11: 100 ug via INTRAVENOUS

## 2019-06-11 MED ORDER — ATORVASTATIN CALCIUM 20 MG PO TABS
40.0000 mg | ORAL_TABLET | Freq: Every evening | ORAL | Status: DC
  Administered 2019-06-13 – 2019-06-15 (×3): 40 mg via ORAL
  Filled 2019-06-11 (×3): qty 2

## 2019-06-11 MED ORDER — MIDAZOLAM HCL 5 MG/5ML IJ SOLN
INTRAMUSCULAR | Status: DC | PRN
  Administered 2019-06-11: 2 mg via INTRAVENOUS

## 2019-06-11 SURGICAL SUPPLY — 26 items
BAG DRAIN CYSTO-URO LG1000N (MISCELLANEOUS) ×2 IMPLANT
CATH FOL LX CONE TIP  8F (CATHETERS) ×1
CATH FOL LX CONE TIP 8F (CATHETERS) ×1 IMPLANT
CATH FOLEY 2WAY  5CC 16FR (CATHETERS) ×1
CATH FOLEY SIL 2WAY 14FR5CC (CATHETERS) IMPLANT
CATH URTH 16FR FL 2W BLN LF (CATHETERS) ×1 IMPLANT
CONRAY 43 FOR UROLOGY 50M (MISCELLANEOUS) ×4 IMPLANT
GLIDEWIRE STIFF .35X180X3 HYDR (WIRE) ×2 IMPLANT
GLOVE BIO SURGEON STRL SZ7 (GLOVE) ×2 IMPLANT
GLOVE BIO SURGEON STRL SZ7.5 (GLOVE) ×2 IMPLANT
GOWN STRL REUS W/ TWL LRG LVL3 (GOWN DISPOSABLE) ×1 IMPLANT
GOWN STRL REUS W/ TWL XL LVL3 (GOWN DISPOSABLE) ×1 IMPLANT
GOWN STRL REUS W/TWL LRG LVL3 (GOWN DISPOSABLE) ×1
GOWN STRL REUS W/TWL XL LVL3 (GOWN DISPOSABLE) ×1
GUIDEWIRE STR ZIPWIRE 035X150 (MISCELLANEOUS) ×2 IMPLANT
NS IRRIG 500ML POUR BTL (IV SOLUTION) ×2 IMPLANT
PACK CYSTO AR (MISCELLANEOUS) ×2 IMPLANT
SET CYSTO W/LG BORE CLAMP LF (SET/KITS/TRAYS/PACK) ×2 IMPLANT
SLEEVE SCD COMPRESS THIGH MED (MISCELLANEOUS) ×2 IMPLANT
SOL .9 NS 3000ML IRR  AL (IV SOLUTION) ×1
SOL .9 NS 3000ML IRR UROMATIC (IV SOLUTION) ×1 IMPLANT
SOL PREP PVP 2OZ (MISCELLANEOUS) ×2
SOLUTION PREP PVP 2OZ (MISCELLANEOUS) ×1 IMPLANT
STENT URET 6FRX26 CONTOUR (STENTS) ×2 IMPLANT
SURGILUBE 2OZ TUBE FLIPTOP (MISCELLANEOUS) ×2 IMPLANT
WATER STERILE IRR 1000ML POUR (IV SOLUTION) ×2 IMPLANT

## 2019-06-11 NOTE — ED Provider Notes (Signed)
[facility] Emergency Department Provider Note  Time seen: 8:45 AM  I have reviewed the triage vital signs and the nursing notes.   HISTORY  Chief Complaint Abdominal Pain    HPI Katelyn Barnes is a 39 y.o. female with a past medical history of diabetes, past DVT in 2016, presents to the emergency department for left lower quadrant abdominal pain.  According to the patient for the past 2 days she has been experiencing left lower quadrant abdominal pain that has been waxing and waning, worse this morning.  Patient is diabetic but states her blood sugars have been around 120 at home.  States she has been nauseated with vomiting which she relates to the left lower quadrant pain.  Denies any fever.  Denies any cough or shortness of breath.  No chest pain.  Describes her pain as moderate aching pain in the left lower quadrant 6/10 currently.   Past Medical History:  Diagnosis Date  . DDD (degenerative disc disease), lumbar   . Diabetes mellitus without complication (HCC)    Type II  . DVT (deep venous thrombosis) (HCC) 2016   post back surgery - was on Eliquis 3 months    Patient Active Problem List   Diagnosis Date Noted  . Radiculopathy 05/05/2016    Past Surgical History:  Procedure Laterality Date  . ANTERIOR CERVICAL DECOMP/DISCECTOMY FUSION Right 05/05/2016   Procedure: ANTERIOR CERVICAL DECOMPRESSION FUSION, CERVICAL 7- THORACIC 1 WITH INSTRUMENTATION AND ALLOGRAFT;  Surgeon: Estill Bamberg, MD;  Location: [facility] OR;  Service: Orthopedics;  Laterality: Right;  ANTERIOR CERVICAL DECOMPRESSION FUSION, CERVICAL 7- THORACIC 1 WITH INSTRUMENTATION AND ALLOGRAFT  . BACK SURGERY  2016   discectomy   . CARPAL TUNNEL RELEASE Bilateral   . TUBAL LIGATION  08/2007    Prior to Admission medications   Medication Sig Start Date End Date Taking? Authorizing Provider  atorvastatin (LIPITOR) 40 MG tablet Take 40 mg by mouth every evening.    [provider]   gabapentin (NEURONTIN) 300 MG capsule Take 1,200 mg by mouth at bedtime.    [provider]  HYDROcodone-acetaminophen (NORCO/VICODIN) 5-325 MG tablet Take 1 tablet by mouth every 6 (six) hours as needed for moderate pain. 10/12/18   Tommi Rumps, PA-C  insulin aspart protamine- aspart (NOVOLOG MIX 70/30) (70-30) 100 UNIT/ML injection Inject 75-85 Units into the skin 2 (two) times daily with a meal. 85 units in the morning, 75 units at bedtime    [provider]  liraglutide (VICTOZA) 18 MG/3ML SOPN Inject 1.8 mg into the skin every morning.    [provider]  metFORMIN (GLUCOPHAGE) 1000 MG tablet Take 1,000 mg by mouth 2 (two) times daily with a meal.    [provider]  valACYclovir (VALTREX) 1000 MG tablet Take 1 tablet (1,000 mg total) by mouth 2 (two) times daily. 10/12/18   Tommi Rumps, PA-C    Allergies  Allergen Reactions  . Penicillins     UNSPECIFIED REACTION     History reviewed. No pertinent family history.  Social History Social History   Tobacco Use  . Smoking status: Never Smoker  . Smokeless tobacco: Never Used  Substance Use Topics  . Alcohol use: No  . Drug use: No    Review of Systems Constitutional: Negative for fever. Cardiovascular: Negative for chest pain. Respiratory: Negative for shortness of breath. Gastrointestinal: Left lower quadrant abdominal pain.  Positive for nausea vomiting. Genitourinary: Negative for urinary compaints Musculoskeletal: Negative for musculoskeletal  complaints Neurological: Negative for headache All other ROS negative  ____________________________________________   PHYSICAL EXAM:  VITAL SIGNS: ED Triage Vitals  Enc Vitals Group     BP 06/11/19 0830 (!) 87/49     Pulse Rate 06/11/19 0830 (!) 140     Resp 06/11/19 0830 (!) 38     Temp 06/11/19 0830 99 F (37.2 C)     Temp Source 06/11/19 0830 Oral     SpO2 06/11/19 0830 99 %     Weight 06/11/19 0829 235 lb (106.6 kg)      Height 06/11/19 0829 5\' 6"  (1.676 m)     Head Circumference --      Peak Flow --      Pain Score 06/11/19 0829 6     Pain Loc --      Pain Edu? --      Excl. in [place]? --    Constitutional: Alert and oriented. Well appearing and in no distress. Eyes: Normal exam ENT      Head: Normocephalic and atraumatic.      Mouth/Throat: Mucous membranes are moist. Cardiovascular: Regular rhythm rate around 130.  No obvious murmur. Respiratory: Normal respiratory effort without tachypnea nor retractions. Breath sounds are clear Gastrointestinal: Soft, moderate left lower quadrant tenderness palpation without rebound guarding or distention.  Obese. Musculoskeletal: Nontender with normal range of motion in all extremities. Neurologic:  Normal speech and language. No gross focal neurologic deficits Skin:  Skin is warm, dry and intact.  Psychiatric: Mood and affect are normal.   ____________________________________________    EKG  EKG viewed and interpreted by myself shows sinus tachycardia 137 bpm with a narrow QRS, left axis deviation, largely normal intervals with nonspecific ST changes.  ____________________________________________    RADIOLOGY  Significant hydronephrosis of the left side with possible pyelonephritis, air within the kidney.  ____________________________________________   INITIAL IMPRESSION / ASSESSMENT AND PLAN / ED COURSE  Pertinent labs & imaging results that were available during my care of the patient were reviewed by me and considered in my medical decision making (see chart for details).   Patient presents to the emergency department for left lower quadrant abdominal pain found to be hypotensive 80/60.  Patient is a known diabetic but states her blood sugars have been well regulated at home.  Fingerstick here is greater than 400.  We will check labs including a VBG to evaluate for possible diabetic ketoacidosis.  Will also obtain a CT scan of the abdomen/pelvis with  IV contrast to rule out diverticulitis or colitis perforation or free fluid.  I performed a bedside fast exam with the ultrasound which did not show any free fluid.  Patient states she just finished her last menstrual cycle does not believe there is any possibility of pregnancy.  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  Still has not been able to produce a urine sample we will perform in and out catheterization for urine.  We will start the patient on broad-spectrum antibiotics, send blood cultures, IV hydrate given her initial hypotension and possibility of sepsis to 30 mL/kg.  Patient's blood pressures improving with fluids currently 95/63.  Given the air within the kidney as well as significant hydronephrosis I spoke to Dr. Junious Silk of urology who requests that the patient be admitted to the hospital service for IV antibiotics but kept n.p.o. in case the patient may require a stent.  I spoke to the patient regarding this as well as plan of care and she is agreeable.  Cherisa Lanasia Porras was evaluated in Emergency Department on 06/11/2019 for the symptoms described in the history of present illness. She was evaluated in the context of the global COVID-19 pandemic, which necessitated consideration that the patient might be at risk for infection with the SARS-CoV-2 virus that causes COVID-19. Institutional protocols and algorithms that pertain to the evaluation of patients at risk for COVID-19 are in a state of rapid change based on information released by regulatory bodies including the CDC and federal and state organizations. These policies and algorithms were followed during the patient's care in the ED.  Peripheral IV insertion by physician Indication: Multiple failed attempts by nursing staff, need for IV access and/or blood samples for workup Performed under continuous real-time ultrasound visualization Area cleaned with chlorhexidine. 20-gauge IV successfully placed in the left  antecubital fossa. 1 attempt, no complications, EBL 0.    CRITICAL CARE Performed by: Minna Antis   Total critical care time: 30 minutes  Critical care time was exclusive of separately billable procedures and treating other patients.  Critical care was necessary to treat or prevent imminent or life-threatening deterioration.  Critical care was time spent personally by me on the following activities: development of treatment plan with patient and/or surrogate as well as nursing, discussions with consultants, evaluation of patient's response to treatment, examination of patient, obtaining history from patient or surrogate, ordering and performing treatments and interventions, ordering and review of laboratory studies, ordering and review of radiographic studies, pulse oximetry and re-evaluation of patient's condition.  ____________________________________________   FINAL CLINICAL IMPRESSION(S) / ED DIAGNOSES  Left lower quadrant abdominal pain Hypotension Pyelonephritis   Minna Antis, MD 06/11/19 1147

## 2019-06-11 NOTE — ED Notes (Signed)
Pt taken  To CT. 

## 2019-06-11 NOTE — ED Notes (Signed)
Patient off unit to OR.

## 2019-06-11 NOTE — Consult Note (Signed)
Pharmacy Antibiotic Note  Katelyn Barnes is a 39 y.o. female admitted on 06/11/2019 with UTI.  Pharmacy has been consulted for Cefepime  Dosing.  Patient received Levofloxacin 750mg  IV x 1 dose in ED  Patient has a reported allergy of Rash to [facility]  Plan: Start Cefepime 2g IV every 12 hours   Height: 5\' 6"  (167.6 cm) Weight: 106.6 kg (235 lb) IBW/kg (Calculated) : 59.3  Temp (24hrs), Avg:99 F (37.2 C), Min:99 F (37.2 C), Max:99 F (37.2 C)  Recent Labs  Lab 06/11/19 0852 06/11/19 1138  WBC 10.4  --   CREATININE 2.40*  --   LATICACIDVEN  --  3.8*    Estimated Creatinine Clearance: 38.5 mL/min (A) (by C-G formula based on SCr of 2.4 mg/dL (H)).    Allergies  Allergen Reactions  . Penicillins Rash    UNSPECIFIED REACTION  UNSPECIFIED REACTION  UNSPECIFIED REACTION  UNSPECIFIED REACTION      Antimicrobials this admission: 5/31 levofloxacin >> x1 6/1 Cefepime >>    Microbiology results: 5/31 BCx: pending 5/31 UCx: pending  Thank you for allowing pharmacy to be a part of this patient's care.  6/31, PharmD, BCPS Clinical Pharmacist 06/11/2019 2:04 PM

## 2019-06-11 NOTE — Anesthesia Preprocedure Evaluation (Addendum)
Anesthesia Evaluation  Patient identified by MRN, date of birth, ID band Patient awake    Reviewed: Allergy & Precautions, H&P , NPO status , Patient's Chart, lab work & pertinent test results  Airway Mallampati: III  TM Distance: >3 FB     Dental  (+) Chipped   Pulmonary  Obesity hypoventilation syndrome  tatchypneic         Cardiovascular negative cardio ROS   Rate:Tachycardia     Neuro/Psych negative neurological ROS  negative psych ROS   GI/Hepatic negative GI ROS, Neg liver ROS,   Endo/Other  diabetes, Insulin DependentPresented in DKA in setting of pyelonephritis  Renal/GU Renal disease (pyelonephritis, hydronephrosis, AKI)     Musculoskeletal   Abdominal   Peds  Hematology H/o DVT, previously on Eliquis   Anesthesia Other Findings Septic and in DKA  Past Medical History: No date: DDD (degenerative disc disease), lumbar No date: Diabetes mellitus without complication (HCC)     Comment:  Type II 2016: DVT (deep venous thrombosis) (HCC)     Comment:  post back surgery - was on Eliquis 3 months  Past Surgical History: 05/05/2016: ANTERIOR CERVICAL DECOMP/DISCECTOMY FUSION; Right     Comment:  Procedure: ANTERIOR CERVICAL DECOMPRESSION FUSION,               CERVICAL 7- THORACIC 1 WITH INSTRUMENTATION AND               ALLOGRAFT;  Surgeon: Estill Bamberg, MD;  Location: [facility] OR;              Service: Orthopedics;  Laterality: Right;  ANTERIOR               CERVICAL DECOMPRESSION FUSION, CERVICAL 7- THORACIC 1               WITH INSTRUMENTATION AND ALLOGRAFT 2016: BACK SURGERY     Comment:  discectomy  No date: CARPAL TUNNEL RELEASE; Bilateral 08/2007: TUBAL LIGATION  BMI    Body Mass Index: 37.93 kg/m      Reproductive/Obstetrics negative OB ROS                           Anesthesia Physical Anesthesia Plan  ASA: III and emergent  Anesthesia Plan: General ETT   Post-op  Pain Management:    Induction:   PONV Risk Score and Plan: Ondansetron, Dexamethasone and Treatment may vary due to age or medical condition  Airway Management Planned:   Additional Equipment:   Intra-op Plan:   Post-operative Plan:   Informed Consent: I have reviewed the patients History and Physical, chart, labs and discussed the procedure including the risks, benefits and alternatives for the proposed anesthesia with the patient or authorized representative who has indicated his/her understanding and acceptance.     Dental Advisory Given  Plan Discussed with: Anesthesiologist, CRNA and Surgeon  Anesthesia Plan Comments:       Anesthesia Quick Evaluation

## 2019-06-11 NOTE — Progress Notes (Signed)
CODE SEPSIS - PHARMACY COMMUNICATION  **Broad Spectrum Antibiotics should be administered within 1 hour of Sepsis diagnosis**  Time Code Sepsis Called/Page Received: @ 1127  Antibiotics Ordered: Levofloxacin   Time of 1st antibiotic administration: @ 1204  Additional action taken by pharmacy: Spoke with RN @ 1200 about time left to start Abx   If necessary, Name of Provider/Nurse Contacted: Lucile Shutters, PharmD, BCPS Clinical Pharmacist 06/11/2019 12:06 PM

## 2019-06-11 NOTE — ED Notes (Signed)
Called lab to check on status of hcg beta and per Tyron Russell it takes an hour to run and should be resulted soon.

## 2019-06-11 NOTE — Consult Note (Signed)
PHARMACY -  BRIEF ANTIBIOTIC NOTE   Pharmacy has received consult(s) for Levofloxacin from an ED provider.  The patient's profile has been reviewed for ht/wt/allergies/indication/available labs.    One time order(s) placed for Levofloxacin 750mg  IV x 1 dose.  Allergy to PCN has been reviewed. Patient reports Rash.   IF antibiotics are continued- A Cephalosporin should be considered.   Further antibiotics/pharmacy consults should be ordered by admitting physician if indicated.                       Thank you, , PharmD, BCPS Clinical Pharmacist 06/11/2019 11:56 AM

## 2019-06-11 NOTE — ED Notes (Signed)
BG 298 no changes need based on endo tool

## 2019-06-11 NOTE — ED Notes (Addendum)
Report given to receiving nurse. Pt awaiting trransport to OR

## 2019-06-11 NOTE — ED Notes (Signed)
Attempted IV stick with no success. MD Pad at bedside with [place] IV.

## 2019-06-11 NOTE — ED Notes (Signed)
BG 353 endo tool rate adjusted to 5.5 units

## 2019-06-11 NOTE — Op Note (Signed)
Preoperative diagnosis: Left hydronephrosis, sepsis Postoperative diagnosis: Left ureteral stricture, sepsis  Procedure: Cystoscopy with left retrograde pyelogram, left diagnostic ureteroscopy, left ureteral stent placement  Surgeon: Junious Silk  Anesthesia: General  Indication for procedure: Katelyn Barnes is a 39 year old diabetic female who had chills and left abdominal pain.  She presented to emergency department and was septic with CT scan revealing left hydroureteronephrosis down to the left distal ureter a few centimeters out from the left UVJ there was a obvious transition point.  She was brought for urgent evaluation.  Findings: On cystoscopy there was an odd mucous/tissue plug in the bladder that was evacuated.  Photo taken.  The trigone and ureteral orifice ease were in their normal orthotopic position.  There was clear reflux from the right.  The urethra appeared normal.  The urine was so cloudy I just double check the urethra and did not see or palpate any sign of a urethral diverticulum.  Left retrograde pyelogram-this outlined a single ureter single collecting system unit.  There is a obvious transition point in the left distal ureter going from narrow to dilated ureter all the way up to the collecting system.  There is an S-shaped tortuosity in the proximal ureter.  Collecting system with mild to moderate dilation.  There was a clip for the tubal ligation superimposed over the area of stricture.  Looking back at the CT scan the clip and the ureteral stricture are at the same location but do appear to be separated by what I thought was at least a centimeter.  Left ureteroscopy-I did a quick ureteroscopy with minimal fluid running to determine if there was a true stricture or possible mucous plug or papillary necrosis.  I did this primarily to determine whether I would need to leave the stent for long-term or temporary with the string.  On ureteroscopy the ureter was narrowed then it opened up to  a small cavity and then narrowed again and opened up into the more proximal ureter where the urine was cloudy and obstructed.  The ureter seemed to be hung up by some external kinking or inflammation and it seemed best not to leave the string, but placed the stent and when she settles down bring her back for a more thorough ureteroscopy.  Description of procedure: After consent was obtained patient brought to the operating room.  After adequate anesthesia she was placed in lithotomy position and prepped and draped in the usual sterile fashion.  Timeout was performed to confirm the patient and procedure.  Cystoscope was passed per urethra and the bladder carefully inspected.  A mucous plug was noted and evacuated.  I uploaded a picture to her chart.  I then cannulated the left ureteral orifice with a 5 [place] open-ended catheter and retrograde injection of contrast was performed.  Much of the contrast washed back into her bladder but the ureter ureter was narrowed with a focal transition point about at the tubal ligation clip with the ureter then opened up to a dilated ureter more proximally.  Only got contrast up to the mid ureter.  I tried to pass a sensor wire but it kept coiling in the ureter distal to the clip.  I advanced the open-ended catheter up to this area to brace the sensor wire and was able to get it to go proximally.  I could not however get the sensor wire through the S shaped tortuosity proximally and passed the 5 [place] open-ended catheter proximally and then used an angled Glidewire to get around  the turn into the collecting system where the ureter spring straight.  Advanced the 5 [place] open-ended catheter into the collecting system and remove the wire and had a good hydronephrotic drip.  I shot some contrast again which outlined the collecting system confirmed proper placement.  I then readvanced the sensor wire and backed out the 5 [place] open-ended catheter.  I took a 4.5 [place] semirigid and  inspected the distal ureter.  Finding more of a stricture type pattern and not obstruction with tissue or a stone I decided it best to not leave the string on the stent.  The wire was backloaded on the cystoscope and a 6 x 26 cm stent advanced.  The wire was removed with a good coil seen in the renal pelvis and a good coil in the bladder.  The stent was draining copious amounts of purulent urine.  I placed a 58 French Foley left to gravity drainage.  The patient was then extubated and awakened and taken to the recovery room in guarded condition.  Complications: None  Blood loss: Minimal  Specimens: None  Drains: 6 x 26 cm left ureteral stent, 16 French Foley catheter  Disposition: Patient stable to PACU-I spoke to her husband in the waiting room and went over the procedure, stent protocol and importance of follow-up.

## 2019-06-11 NOTE — Progress Notes (Signed)
Arrived via bed from OR accompanied by CRNA, OR RN and Dr Orland Penman. Heart rate 130, per  Dr Orland Penman not going to treat tachycardia at this time due to low blood pressure. Will continue to monitor.

## 2019-06-11 NOTE — Discharge Instructions (Signed)
Ureteral Stent Implantation, Care After This sheet gives you information about how to care for yourself after your procedure. Your health care provider may also give you more specific instructions. If you have problems or questions, contact your health care provider.  Removal of the stent: Be sure to keep follow-up with Dr. Mena Goes to plan stent removal   What can I expect after the procedure? After the procedure, it is common to have:  Nausea.  Mild pain when you urinate. You may feel this pain in your lower back or lower abdomen. The pain should stop within a few minutes after you urinate. This may last for up to 1 week.  A small amount of blood in your urine for several days. Follow these instructions at home: Medicines  Take over-the-counter and prescription medicines only as told by your health care provider.  If you were prescribed an antibiotic medicine, take it as told by your health care provider. Do not stop taking the antibiotic even if you start to feel better.  Do not drive for 24 hours if you were given a sedative during your procedure.  Ask your health care provider if the medicine prescribed to you requires you to avoid driving or using heavy machinery. Activity  Rest as told by your health care provider.  Avoid sitting for a long time without moving. Get up to take short walks every 1-2 hours. This is important to improve blood flow and breathing. Ask for help if you feel weak or unsteady.  Return to your normal activities as told by your health care provider. Ask your health care provider what activities are safe for you. General instructions   Watch for any blood in your urine. Call your health care provider if the amount of blood in your urine increases.  If you have a catheter: ? Follow instructions from your health care provider about taking care of your catheter and collection bag. ? Do not take baths, swim, or use a hot tub until your health care provider  approves. Ask your health care provider if you may take showers. You may only be allowed to take sponge baths.  Drink enough fluid to keep your urine pale yellow.  Do not use any products that contain nicotine or tobacco, such as cigarettes, e-cigarettes, and chewing tobacco. These can delay healing after surgery. If you need help quitting, ask your health care provider.  Keep all follow-up visits as told by your health care provider. This is important. Contact a health care provider if:  You have pain that gets worse or does not get better with medicine, especially pain when you urinate.  You have difficulty urinating.  You feel nauseous or you vomit repeatedly during a period of more than 2 days after the procedure. Get help right away if:  Your urine is dark red or has blood clots in it.  You are leaking urine (have incontinence).  The end of the stent comes out of your urethra.  You cannot urinate.  You have sudden, sharp, or severe pain in your abdomen or lower back.  You have a fever.  You have swelling or pain in your legs.  You have difficulty breathing. Summary  After the procedure, it is common to have mild pain when you urinate that goes away within a few minutes after you urinate. This may last for up to 1 week.  Watch for any blood in your urine. Call your health care provider if the amount of blood in  your urine increases.  Take over-the-counter and prescription medicines only as told by your health care provider.  Drink enough fluid to keep your urine pale yellow. This information is not intended to replace advice given to you by your health care provider. Make sure you discuss any questions you have with your health care provider. Document Revised: 10/04/2017 Document Reviewed: 10/05/2017 Elsevier Patient Education  2020 [employer].

## 2019-06-11 NOTE — ED Notes (Signed)
51f foley cath placed, cloudy urine output noted. Meds infusing as ordered. Labs drawn and sent.m BG 387

## 2019-06-11 NOTE — H&P (Signed)
History and Physical    Katelyn Barnes ZWC:[id number] DOB: 1980-01-22 DOA: 06/11/2019  PCP: Cathie Hoops, PA   Patient coming from: Home  I have personally briefly reviewed patient's old medical records in [facility]  Chief Complaint: Abdominal Pain  HPI: Katelyn Barnes is a 39 y.o. female with medical history significant for insulin-dependent diabetes mellitus, obesity, history of DVT following back surgery who presents to the emergency room for evaluation of left lower quadrant pain.  According to the patient she has had left lower quadrant pain for about 2 days which has waxed and waned but on the day of admission got worse.  She rates her pain a 10 x 10 in intensity at its worst.  She describes it as a constant ache with no radiation.  Abdominal pain is associated with anorexia, nausea and vomiting but she denies having any fever, no shortness of breath, no chest pain, no cough, no changes in her bowel habits.  Patient did not administer her insulin because she had not been eating or drinking. Upon arrival to the emergency room she had a low-grade temp 29F, she was hypotensive with systolic blood pressure and tachycardic in the 140s Labs reveal a serum glucose of 427 with bicarbonate level of 15 and an anion gap of 16, serum creatinine of 2.40 compared to 0.493 years ago.  She also has pyuria.  Lactic acid is pending. CT scan of abdomen pelvis done without contrast showed diffuse LEFT renal enlargement with gas along the superior aspect of the LEFT kidney, highly suspicious for diffuse infection and emphysematous pyelonephritis of the upper LEFT kidney. Moderate LEFT hydroureteronephrosis extending to near the bladder with equivocal slight transition change in the distal LEFT ureter 1 cm above the UVJ. No evidence of obstructing cause/urinary calculi. This hydroureteronephrosis may be caused by infection or possibly an obstructing process not visualized or passed  calculus.  ED Course: Patient is a 39 year old female who presents to the emergency room for evaluation of left lower quadrant pain.  She was hypotensive upon arrival to the ER with a low-grade temp and tachycardic.  CT scan of abdomen and pelvis done without contrast is highly suspicious for emphysematous pyelonephritis of the upper left kidney with moderate left hydroureteronephrosis.  Labs also revealed acute kidney injury as well as hyperglycemia with metabolic acidosis.  Patient received a dose of IV Levaquin.  She will be admitted to the hospital for further evaluation.  Review of Systems: As per HPI otherwise 10 point review of systems negative.    Past Medical History:  Diagnosis Date  . DDD (degenerative disc disease), lumbar   . Diabetes mellitus without complication (HCC)    Type II  . DVT (deep venous thrombosis) (HCC) 2016   post back surgery - was on Eliquis 3 months    Past Surgical History:  Procedure Laterality Date  . ANTERIOR CERVICAL DECOMP/DISCECTOMY FUSION Right 05/05/2016   Procedure: ANTERIOR CERVICAL DECOMPRESSION FUSION, CERVICAL 7- THORACIC 1 WITH INSTRUMENTATION AND ALLOGRAFT;  Surgeon: Estill Bamberg, MD;  Location: [facility] OR;  Service: Orthopedics;  Laterality: Right;  ANTERIOR CERVICAL DECOMPRESSION FUSION, CERVICAL 7- THORACIC 1 WITH INSTRUMENTATION AND ALLOGRAFT  . BACK SURGERY  2016   discectomy   . CARPAL TUNNEL RELEASE Bilateral   . TUBAL LIGATION  08/2007     reports that she has never smoked. She has never used smokeless tobacco. She reports that she does not drink alcohol or use drugs.  Allergies  Allergen Reactions  .  Penicillins Rash    UNSPECIFIED REACTION  UNSPECIFIED REACTION  UNSPECIFIED REACTION  UNSPECIFIED REACTION      History reviewed. No pertinent family history.   Prior to Admission medications   Medication Sig Start Date End Date Taking? Authorizing Provider  atorvastatin (LIPITOR) 40 MG tablet Take 40 mg by mouth every  evening.    [provider]  gabapentin (NEURONTIN) 300 MG capsule Take 1,200 mg by mouth at bedtime.    [provider]  insulin aspart protamine- aspart (NOVOLOG MIX 70/30) (70-30) 100 UNIT/ML injection Inject 75-85 Units into the skin 2 (two) times daily with a meal. 85 units in the morning, 75 units at bedtime    [provider]  liraglutide (VICTOZA) 18 MG/3ML SOPN Inject 1.8 mg into the skin every morning.    [provider]  metFORMIN (GLUCOPHAGE) 1000 MG tablet Take 1,000 mg by mouth 2 (two) times daily with a meal.    [provider]  valACYclovir (VALTREX) 1000 MG tablet Take 1 tablet (1,000 mg total) by mouth 2 (two) times daily. 10/12/18   Tommi Rumps, PA-C    Physical Exam: Vitals:   06/11/19 0930 06/11/19 1000 06/11/19 1016 06/11/19 1030  BP:  98/77  95/63  Pulse: (!) 126 (!) 127 (!) 126 (!) 125  Resp:    19  Temp:      TempSrc:      SpO2: 96% 98% 95% 95%  Weight:      Height:         Vitals:   06/11/19 0930 06/11/19 1000 06/11/19 1016 06/11/19 1030  BP:  98/77  95/63  Pulse: (!) 126 (!) 127 (!) 126 (!) 125  Resp:    19  Temp:      TempSrc:      SpO2: 96% 98% 95% 95%  Weight:      Height:        Constitutional: NAD, alert and oriented x 3.  Acutely ill-appearing, obese Eyes: PERRL, lids and conjunctivae normal ENMT: Mucous membranes are dry Neck: normal, supple, no masses, no thyromegaly Respiratory: clear to auscultation bilaterally, no wheezing, no crackles. Normal respiratory effort. No accessory muscle use.  Cardiovascular: Tachycardic, no murmurs / rubs / gallops. No extremity edema. 2+ pedal pulses. No carotid bruits.  Abdomen: Left CVA tenderness/left lower quadrant, no masses palpated. No hepatosplenomegaly. Bowel sounds positive.  Musculoskeletal: no clubbing / cyanosis. No joint deformity upper and lower extremities.  Skin: no rashes, lesions, ulcers.  Neurologic: No gross focal neurologic  deficit. Psychiatric: Normal mood and affect.   Labs on Admission: I have personally reviewed following labs and imaging studies  CBC: Recent Labs  Lab 06/11/19 0852  WBC 10.4  HGB 13.4  HCT 39.1  MCV 82.7  PLT 93*   Basic Metabolic Panel: Recent Labs  Lab 06/11/19 0852  NA 130*  K 4.4  CL 99  CO2 15*  GLUCOSE 427*  BUN 27*  CREATININE 2.40*  CALCIUM 8.3*   GFR: Estimated Creatinine Clearance: 38.5 mL/min (A) (by C-G formula based on SCr of 2.4 mg/dL (H)). Liver Function Tests: Recent Labs  Lab 06/11/19 0852  AST 39  ALT 27  ALKPHOS 69  BILITOT 2.8*  PROT 7.1  ALBUMIN 3.5   Recent Labs  Lab 06/11/19 0852  LIPASE 16   No results for input(s): AMMONIA in the last 168 hours. Coagulation Profile: No results for input(s): INR, PROTIME in the last 168 hours. Cardiac Enzymes: No results for input(s):  CKTOTAL, CKMB, CKMBINDEX, TROPONINI in the last 168 hours. BNP (last 3 results) No results for input(s): PROBNP in the last 8760 hours. HbA1C: No results for input(s): HGBA1C in the last 72 hours. CBG: Recent Labs  Lab 06/11/19 0846  GLUCAP 419*   Lipid Profile: No results for input(s): CHOL, HDL, LDLCALC, TRIG, CHOLHDL, LDLDIRECT in the last 72 hours. Thyroid Function Tests: No results for input(s): TSH, T4TOTAL, FREET4, T3FREE, THYROIDAB in the last 72 hours. Anemia Panel: No results for input(s): VITAMINB12, FOLATE, FERRITIN, TIBC, IRON, RETICCTPCT in the last 72 hours. Urine analysis:    Component Value Date/Time   COLORURINE YELLOW (A) 06/11/2019 0859   APPEARANCEUR CLOUDY (A) 06/11/2019 0859   APPEARANCEUR Clear 05/27/2013 1839   LABSPEC 1.015 06/11/2019 0859   LABSPEC 1.037 05/27/2013 1839   PHURINE 5.0 06/11/2019 0859   GLUCOSEU >=500 (A) 06/11/2019 0859   GLUCOSEU >=500 05/27/2013 1839   HGBUR MODERATE (A) 06/11/2019 0859   BILIRUBINUR NEGATIVE 06/11/2019 0859   BILIRUBINUR Negative 05/27/2013 1839   KETONESUR 5 (A) 06/11/2019 0859    PROTEINUR 100 (A) 06/11/2019 0859   NITRITE NEGATIVE 06/11/2019 0859   LEUKOCYTESUR TRACE (A) 06/11/2019 0859   LEUKOCYTESUR Negative 05/27/2013 1839    Radiological Exams on Admission: CT ABDOMEN PELVIS WO CONTRAST  Result Date: 06/11/2019 CLINICAL DATA:  39 year old female acute LEFT abdominal and pelvic pain. EXAM: CT ABDOMEN AND PELVIS WITHOUT CONTRAST TECHNIQUE: Multidetector CT imaging of the abdomen and pelvis was performed following the standard protocol without IV contrast. COMPARISON:  05/27/2013 CT FINDINGS: Please note that parenchymal abnormalities may be missed without intravenous contrast. Lower chest: No acute abnormality. Hepatobiliary: The liver and gallbladder are unremarkable. No biliary dilatation. Pancreas: Unremarkable Spleen: Unremarkable Adrenals/Urinary Tract: The LEFT kidney is diffusely enlarged and there is moderate perinephric inflammation. A small amount of gas along the SUPERIOR aspect of the LEFT kidney is noted, highly suspicious for infection/emphysematous pyelonephritis. There is moderate LEFT hydroureteronephrosis extending to near the bladder within equivocal slight transition change in the distal LEFT ureter 1 cm above the UVJ. There is no evidence of urinary calculi. Renal vasculature is not well evaluated without intravenous contrast. The RIGHT kidney, adrenal glands and bladder are unremarkable. Stomach/Bowel: Stomach is within normal limits. Appendix appears normal. No evidence of bowel wall thickening, distention, or inflammatory changes. Vascular/Lymphatic: Aortic atherosclerosis. Mildly enlarged LEFT perinephric lymph nodes are likely reactive. Reproductive: Uterus and bilateral adnexa are unremarkable except for tubal ligation clips. Other: A small to moderate amount of free fluid within the abdomen and pelvis noted. No evidence of pneumoperitoneum. Musculoskeletal: No acute or suspicious bony abnormalities. Degenerative changes in the lumbar spine again noted.  IMPRESSION: 1. 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 Moderate LEFT hydroureteronephrosis extending to near the bladder with equivocal slight transition change in the distal LEFT ureter 1 cm above the UVJ. No evidence of obstructing cause/urinary calculi. This hydroureteronephrosis may be caused by infection or possibly an obstructing process not visualized or passed calculus. 2. Small to moderate amount of free fluid within the abdomen and pelvis. 3. Aortic Atherosclerosis (ICD10-I70.0. Critical Value/emergent results were called by telephone at the time of interpretation on 06/11/2019 at 11:20 am to provider [facility] , who verbally acknowledged these results. Electronically Signed   By: Margarette Canada M.D.   On: 06/11/2019 11:28    EKG: Independently reviewed.  Sinus tachycardia  Assessment/Plan Principal Problem:   Sepsis (HCC) Active Problems:   AKI (acute kidney injury) (HCC)   Emphysematous pyelonephritis of left kidney   DKA (diabetic ketoacidoses) (HCC)   Obesity hypoventilation syndrome (HCC)    Sepsis from acute emphysematous pyelonephritis of left kidney (POA) As evidenced by hypotension that improved with aggressive IV fluid resuscitation, low-grade fever and tachycardia all present on admission Lactic acid is elevated at 3.8  CT scan of the abdomen and pelvis done without contrast is suggestive of emphysematous pyelonephritis of left kidney with left hydronephrosis We will place patient on cefepime adjusted to renal function We will request urology consult Continue aggressive fluid resuscitation    Diabetic ketoacidosis Most likely secondary to acute illness Patient with hyperglycemia as well as anion gap metabolic acidosis We will keep patient n.p.o. Start patient on regular insulin infusion per protocol Serial  electrolyte/blood sugar checks    Acute kidney injury Most likely obstructive and secondary to left hydronephrosis We will request urology consult Place Foley catheter Repeat renal parameters in a.m.    Obesity (BMI37.93) Complicates overall prognosis and care    DVT prophylaxis: SCD Code Status: Full Family Communication: Greater than percent of time was spent discussing patient's condition and plan of care with her at the bedside.  All questions and concerns have been addressed.  She verbalizes understanding and agrees with the plan. Disposition Plan: Back to previous home environment Consults called: Urology    Lucile Shutters MD [facility]     06/11/2019, 12:38 PM

## 2019-06-11 NOTE — ED Notes (Signed)
Lab called and will add on hcg beta to pt's blood for preg test

## 2019-06-11 NOTE — ED Triage Notes (Signed)
Here for LLQ pain. Denies radiation to back/flank. Tachy, hypotensive in triage. Appears pale. Is diabetic.  No vomiting today per pt.

## 2019-06-11 NOTE — Progress Notes (Signed)
PHARMACY - PHYSICIAN COMMUNICATION CRITICAL VALUE ALERT - BLOOD CULTURE IDENTIFICATION (BCID)  Katelyn Barnes is an 39 y.o. female who presented to [facility] on 06/11/2019 with a chief complaint of abdominal pain found to be emphysematous pyelonephritis on CT abdomen s/p cystoscopy w/ L ureteral stent placement  Assessment:  Tmax 100.7, tachycardic 120 - 140's, systolic BP 85 - 89, LA 3.5 (no repeat), 2/4 GNR BCID E. Coli [facility] -  Name of physician (or Provider) Contacted: Webb Silversmith  Current antibiotics: cefepime 2g IV q12h   Changes to prescribed antibiotics recommended:  Recommendations accepted by provider -- switching to meropenem 1g IV q12h per CrCl 30 - 50 ml/min for possible ESBL E. Coli, will continue to monitor and adjust doses as necessary.  Results for orders placed or performed during the hospital encounter of 06/11/19  Blood Culture ID Panel (Reflexed) (Collected: 06/11/2019 11:38 AM)  Result Value Ref Range   Enterococcus species NOT DETECTED NOT DETECTED   Listeria monocytogenes NOT DETECTED NOT DETECTED   Staphylococcus species NOT DETECTED NOT DETECTED   Staphylococcus aureus (BCID) NOT DETECTED NOT DETECTED   Streptococcus species NOT DETECTED NOT DETECTED   Streptococcus agalactiae NOT DETECTED NOT DETECTED   Streptococcus pneumoniae NOT DETECTED NOT DETECTED   Streptococcus pyogenes NOT DETECTED NOT DETECTED   Acinetobacter baumannii NOT DETECTED NOT DETECTED   Enterobacteriaceae species DETECTED (A) NOT DETECTED   Enterobacter cloacae complex NOT DETECTED NOT DETECTED   Escherichia coli DETECTED (A) NOT DETECTED   Klebsiella oxytoca NOT DETECTED NOT DETECTED   Klebsiella pneumoniae NOT DETECTED NOT DETECTED   Proteus species NOT DETECTED NOT DETECTED   Serratia marcescens NOT DETECTED NOT DETECTED   Carbapenem resistance NOT DETECTED NOT DETECTED   Haemophilus influenzae NOT DETECTED NOT DETECTED   Neisseria meningitidis NOT DETECTED NOT DETECTED   Pseudomonas aeruginosa NOT DETECTED NOT DETECTED   Candida albicans NOT DETECTED NOT DETECTED   Candida glabrata NOT DETECTED NOT DETECTED   Candida krusei NOT DETECTED NOT DETECTED   Candida parapsilosis NOT DETECTED NOT DETECTED   Candida tropicalis NOT DETECTED NOT DETECTED   Thomasene Ripple, PharmD, BCPS Clinical Pharmacist 06/11/2019  11:17 PM

## 2019-06-11 NOTE — Consult Note (Addendum)
Consultation: Sepsis, left hydronephrosis Requested by: Dr. Francia Greaves   History of Present Illness: Katelyn Barnes is a 39 year old female with a history of diabetes who developed left flank pain and left lower quadrant pain yesterday with fevers and chills.  She had progressive malaise today and her husband brought her to the emergency.  She denies any dysuria or gross hematuria.  CT scan of the abdomen and pelvis was obtained which showed left hydroureteronephrosis down to the distal ureter although no stone was seen there was an interesting transition point.  There is also small amount of air possibly in the upper pole parenchyma and to the edge of the capsule but no fluid collections or abscess.  Patient denies any urologic history or surgery.  She has a history of a tubal ligation.  She is being resuscitated.  Her creatinine bumped to 2.4 from 0.49.  Glucose 427.  Lactic acid 3.8.  Urinalysis showed rare bacteria, 11-20 white cells, 6-10 red cells, negative nitrite, trace leukocytes.  White count 10.4.  Past Medical History:  Diagnosis Date  . DDD (degenerative disc disease), lumbar   . Diabetes mellitus without complication ([place])    Type II  . DVT (deep venous thrombosis) ([place]) 2016   post back surgery - was on Eliquis 3 months   Past Surgical History:  Procedure Laterality Date  . ANTERIOR CERVICAL DECOMP/DISCECTOMY FUSION Right 05/05/2016   Procedure: ANTERIOR CERVICAL DECOMPRESSION FUSION, CERVICAL 7- THORACIC 1 WITH INSTRUMENTATION AND ALLOGRAFT;  Surgeon: Phylliss Bob, MD;  Location: [facility];  Service: Orthopedics;  Laterality: Right;  ANTERIOR CERVICAL DECOMPRESSION FUSION, CERVICAL 7- THORACIC 1 WITH INSTRUMENTATION AND ALLOGRAFT  . BACK SURGERY  2016   discectomy   . CARPAL TUNNEL RELEASE Bilateral   . TUBAL LIGATION  08/2007    Home Medications:  (Not in a hospital admission)  Allergies:  Allergies  Allergen Reactions  . Penicillins Rash    UNSPECIFIED REACTION   UNSPECIFIED REACTION  UNSPECIFIED REACTION  UNSPECIFIED REACTION      History reviewed. No pertinent family history. Social History:  reports that she has never smoked. She has never used smokeless tobacco. She reports that she does not drink alcohol or use drugs.  ROS: A complete review of systems was performed.  All systems are negative except for pertinent findings as noted. Review of Systems  Constitutional: Positive for chills, fever and malaise/fatigue.  Gastrointestinal: Positive for abdominal pain and nausea.     Physical Exam:  Vital signs in last 24 hours: Temp:  [99 F (37.2 C)] 99 F (37.2 C) (05/31 0830) Pulse Rate:  [123-140] 132 (05/31 1230) Resp:  [19-38] 19 (05/31 1030) BP: (87-102)/(49-77) 102/75 (05/31 1230) SpO2:  [95 %-100 %] 97 % (05/31 1230) Weight:  [106.6 kg] 106.6 kg (05/31 0829) General:  Alert and oriented, No acute distress, ill-appearing, clutching her left side HEENT: Normocephalic, atraumatic Cardiovascular: Regular rate and rhythm Lungs: Regular rate and effort Abdomen: Soft, nontender, nondistended, no abdominal masses Back: No CVA tenderness Extremities: No edema Neurologic: Grossly intact  Laboratory Data:  Results for orders placed or performed during the hospital encounter of 06/11/19 (from the past 24 hour(s))  Blood gas, venous     Status: Abnormal (Preliminary result)   Collection Time: 06/11/19  8:46 AM  Result Value Ref Range   pH, Ven 7.34 7.250 - 7.430   pCO2, Ven 37 (L) 44.0 - 60.0 mmHg   pO2, Ven PENDING 32.0 - 45.0 mmHg   Bicarbonate 20.0 20.0 - 28.0  mmol/L   Acid-base deficit 5.2 (H) 0.0 - 2.0 mmol/L   O2 Saturation 48.0 %   Patient temperature 37.0    Collection site VENOUS    Sample type VENOUS   Glucose, capillary     Status: Abnormal   Collection Time: 06/11/19  8:46 AM  Result Value Ref Range   Glucose-Capillary 419 (H) 70 - 99 mg/dL  Lipase, blood     Status: None   Collection Time: 06/11/19  8:52 AM   Result Value Ref Range   Lipase 16 11 - 51 U/L  Comprehensive metabolic panel     Status: Abnormal   Collection Time: 06/11/19  8:52 AM  Result Value Ref Range   Sodium 130 (L) 135 - 145 mmol/L   Potassium 4.4 3.5 - 5.1 mmol/L   Chloride 99 98 - 111 mmol/L   CO2 15 (L) 22 - 32 mmol/L   Glucose, Bld 427 (H) 70 - 99 mg/dL   BUN 27 (H) 6 - 20 mg/dL   Creatinine, Ser 2.70 (H) 0.44 - 1.00 mg/dL   Calcium 8.3 (L) 8.9 - 10.3 mg/dL   Total Protein 7.1 6.5 - 8.1 g/dL   Albumin 3.5 3.5 - 5.0 g/dL   AST 39 15 - 41 U/L   ALT 27 0 - 44 U/L   Alkaline Phosphatase 69 38 - 126 U/L   Total Bilirubin 2.8 (H) 0.3 - 1.2 mg/dL   GFR calc non Af Amer 24 (L) >60 mL/min   GFR calc Af Amer 28 (L) >60 mL/min   Anion gap 16 (H) 5 - 15  CBC     Status: Abnormal   Collection Time: 06/11/19  8:52 AM  Result Value Ref Range   WBC 10.4 4.0 - 10.5 K/uL   RBC 4.73 3.87 - 5.11 MIL/uL   Hemoglobin 13.4 12.0 - 15.0 g/dL   HCT 35.0 09.3 - 81.8 %   MCV 82.7 80.0 - 100.0 fL   MCH 28.3 26.0 - 34.0 pg   MCHC 34.3 30.0 - 36.0 g/dL   RDW 29.9 37.1 - 69.6 %   Platelets 93 (L) 150 - 400 K/uL   nRBC 0.0 0.0 - 0.2 %  Troponin I (High Sensitivity)     Status: None   Collection Time: 06/11/19  8:52 AM  Result Value Ref Range   Troponin I (High Sensitivity) 11 <18 ng/L  Urinalysis, Complete w Microscopic     Status: Abnormal   Collection Time: 06/11/19  8:59 AM  Result Value Ref Range   Color, Urine YELLOW (A) YELLOW   APPearance CLOUDY (A) CLEAR   Specific Gravity, Urine 1.015 1.005 - 1.030   pH 5.0 5.0 - 8.0   Glucose, UA >=500 (A) NEGATIVE mg/dL   Hgb urine dipstick MODERATE (A) NEGATIVE   Bilirubin Urine NEGATIVE NEGATIVE   Ketones, ur 5 (A) NEGATIVE mg/dL   Protein, ur 789 (A) NEGATIVE mg/dL   Nitrite NEGATIVE NEGATIVE   Leukocytes,Ua TRACE (A) NEGATIVE   RBC / HPF 6-10 0 - 5 RBC/hpf   WBC, UA 11-20 0 - 5 WBC/hpf   Bacteria, UA RARE (A) NONE SEEN   Squamous Epithelial / LPF 6-10 0 - 5   Mucus PRESENT     Amorphous Crystal PRESENT   SARS Coronavirus 2 by RT PCR (hospital order, performed in [facility] hospital lab) Nasopharyngeal Nasopharyngeal Swab     Status: None   Collection Time: 06/11/19  8:59 AM   Specimen: Nasopharyngeal Swab  Result Value  Ref Range   SARS Coronavirus 2 NEGATIVE NEGATIVE  hCG, quantitative, pregnancy     Status: None   Collection Time: 06/11/19  9:04 AM  Result Value Ref Range   hCG, Beta Chain, Quant, S 1 <5 mIU/mL  Troponin I (High Sensitivity)     Status: None   Collection Time: 06/11/19 11:38 AM  Result Value Ref Range   Troponin I (High Sensitivity) 10 <18 ng/L  Lactic acid, plasma     Status: Abnormal   Collection Time: 06/11/19 11:38 AM  Result Value Ref Range   Lactic Acid, Venous 3.8 (HH) 0.5 - 1.9 mmol/L   Recent Results (from the past 240 hour(s))  SARS Coronavirus 2 by RT PCR (hospital order, performed in [facility] hospital lab) Nasopharyngeal Nasopharyngeal Swab     Status: None   Collection Time: 06/11/19  8:59 AM   Specimen: Nasopharyngeal Swab  Result Value Ref Range Status   SARS Coronavirus 2 NEGATIVE NEGATIVE Final    Comment: (NOTE) SARS-CoV-2 target nucleic acids are NOT DETECTED. The SARS-CoV-2 RNA is generally detectable in upper and lower respiratory specimens during the acute phase of infection. The lowest concentration of SARS-CoV-2 viral copies this assay can detect is 250 copies / mL. A negative result does not preclude SARS-CoV-2 infection and should not be used as the sole basis for treatment or other patient management decisions.  A negative result may occur with improper specimen collection / handling, submission of specimen other than nasopharyngeal swab, presence of viral mutation(s) within the areas targeted by this assay, and inadequate number of viral copies (<250 copies / mL). A negative result must be combined with clinical observations, patient history, and epidemiological information. Fact Sheet for Patients:    BoilerBrush.com.cy Fact Sheet for Healthcare Providers: https://pope.com/ This test is not yet approved or cleared  by the [place] FDA and has been authorized for detection and/or diagnosis of SARS-CoV-2 by FDA under an Emergency Use Authorization (EUA).  This EUA will remain in effect (meaning this test can be used) for the duration of the COVID-19 declaration under Section 564(b)(1) of the Act, 21 U.S.C. section 360bbb-3(b)(1), unless the authorization is terminated or revoked sooner. Performed at [facility], [address] [place], [place] [address]    Creatinine: Recent Labs    06/11/19 2725  CREATININE 2.40*    Impression/Assessment:  Sepsis, possible left emphysematous pyelitis with left hydronephrosis-I discussed the CT findings with the patient.  She may have some inflammation of the left ureter which is simply causing some of the hydronephrosis without a true obstruction or I am concerned she could have some debris or something like papillary necrosis obstructing the left distal ureter.  We discussed I discussed with the patient the nature, potential benefits, risks and alternatives to cystoscopy with left retrograde pyelogram and left ureteral stent placement possible ureteroscopy, including side effects of the proposed treatment, the likelihood of the patient achieving the goals of the procedure, and any potential problems that might occur during the procedure or recuperation.  All questions answered. Patient elects to proceed with stent.  Possible quick look ureteroscopy for prognostic/diagnostic purposes.  Jerilee Field 06/11/2019, 1:59 PM

## 2019-06-11 NOTE — Anesthesia Procedure Notes (Signed)
Procedure Name: Intubation Date/Time: 06/11/2019 5:14 PM Performed by: Almeta Monas, CRNA Pre-anesthesia Checklist: Patient identified, Patient being monitored, Timeout performed, Emergency Drugs available and Suction available Patient Re-evaluated:Patient Re-evaluated prior to induction Oxygen Delivery Method: Circle system utilized Preoxygenation: Pre-oxygenation with 100% oxygen Induction Type: IV induction, Cricoid Pressure applied and Rapid sequence Laryngoscope Size: 3 and McGraph Grade View: Grade I Tube type: Oral Tube size: 7.0 mm Number of attempts: 1 Airway Equipment and Method: Stylet and Video-laryngoscopy Placement Confirmation: ETT inserted through vocal cords under direct vision,  positive ETCO2 and breath sounds checked- equal and bilateral Secured at: 21 cm Tube secured with: Tape Dental Injury: Teeth and Oropharynx as per pre-operative assessment  Difficulty Due To: Difficult Airway- due to large tongue Future Recommendations: Recommend- induction with short-acting agent, and alternative techniques readily available

## 2019-06-11 NOTE — Transfer of Care (Signed)
Immediate Anesthesia Transfer of Care Note  Patient: Katelyn Barnes  Procedure(s) Performed: CYSTOSCOPY WITH URETEROSCOPY AND STENT PLACEMENT (Left )  Patient Location: PACU  Anesthesia Type:General  Level of Consciousness: sedated and drowsy  Airway & Oxygen Therapy: Patient Spontanous Breathing and Patient connected to face mask oxygen  Post-op Assessment: Report given to RN and Post -op Vital signs reviewed and stable  Post vital signs: Reviewed and stable  Last Vitals:  Vitals Value Taken Time  BP 85/60 06/11/19 1813  Temp 36.4 C 06/11/19 1813  Pulse 130 06/11/19 1825  Resp 30 06/11/19 1825  SpO2 98 % 06/11/19 1825  Vitals shown include unvalidated device data.  Last Pain:  Vitals:   06/11/19 1813  TempSrc:   PainSc: Asleep         Complications: No apparent anesthesia complications

## 2019-06-11 NOTE — ED Notes (Signed)
BG 331 endo toll adjusted to 6.5

## 2019-06-12 ENCOUNTER — Telehealth: Payer: Self-pay | Admitting: Urology

## 2019-06-12 ENCOUNTER — Inpatient Hospital Stay: Payer: Medicaid Other

## 2019-06-12 DIAGNOSIS — N390 Urinary tract infection, site not specified: Secondary | ICD-10-CM

## 2019-06-12 DIAGNOSIS — J9600 Acute respiratory failure, unspecified whether with hypoxia or hypercapnia: Secondary | ICD-10-CM

## 2019-06-12 DIAGNOSIS — N132 Hydronephrosis with renal and ureteral calculous obstruction: Secondary | ICD-10-CM

## 2019-06-12 DIAGNOSIS — N170 Acute kidney failure with tubular necrosis: Secondary | ICD-10-CM

## 2019-06-12 DIAGNOSIS — R6520 Severe sepsis without septic shock: Secondary | ICD-10-CM

## 2019-06-12 DIAGNOSIS — A419 Sepsis, unspecified organism: Secondary | ICD-10-CM

## 2019-06-12 LAB — GLUCOSE, CAPILLARY
Glucose-Capillary: 184 mg/dL — ABNORMAL HIGH (ref 70–99)
Glucose-Capillary: 186 mg/dL — ABNORMAL HIGH (ref 70–99)
Glucose-Capillary: 187 mg/dL — ABNORMAL HIGH (ref 70–99)
Glucose-Capillary: 191 mg/dL — ABNORMAL HIGH (ref 70–99)
Glucose-Capillary: 192 mg/dL — ABNORMAL HIGH (ref 70–99)
Glucose-Capillary: 194 mg/dL — ABNORMAL HIGH (ref 70–99)
Glucose-Capillary: 197 mg/dL — ABNORMAL HIGH (ref 70–99)
Glucose-Capillary: 207 mg/dL — ABNORMAL HIGH (ref 70–99)
Glucose-Capillary: 208 mg/dL — ABNORMAL HIGH (ref 70–99)
Glucose-Capillary: 208 mg/dL — ABNORMAL HIGH (ref 70–99)
Glucose-Capillary: 208 mg/dL — ABNORMAL HIGH (ref 70–99)
Glucose-Capillary: 209 mg/dL — ABNORMAL HIGH (ref 70–99)
Glucose-Capillary: 214 mg/dL — ABNORMAL HIGH (ref 70–99)
Glucose-Capillary: 214 mg/dL — ABNORMAL HIGH (ref 70–99)
Glucose-Capillary: 219 mg/dL — ABNORMAL HIGH (ref 70–99)
Glucose-Capillary: 224 mg/dL — ABNORMAL HIGH (ref 70–99)
Glucose-Capillary: 229 mg/dL — ABNORMAL HIGH (ref 70–99)
Glucose-Capillary: 232 mg/dL — ABNORMAL HIGH (ref 70–99)
Glucose-Capillary: 233 mg/dL — ABNORMAL HIGH (ref 70–99)
Glucose-Capillary: 284 mg/dL — ABNORMAL HIGH (ref 70–99)

## 2019-06-12 LAB — BASIC METABOLIC PANEL
Anion gap: 12 (ref 5–15)
Anion gap: 10 (ref 5–15)
BUN: 32 mg/dL — ABNORMAL HIGH (ref 6–20)
BUN: 33 mg/dL — ABNORMAL HIGH (ref 6–20)
CO2: 19 mmol/L — ABNORMAL LOW (ref 22–32)
CO2: 19 mmol/L — ABNORMAL LOW (ref 22–32)
Calcium: 7.3 mg/dL — ABNORMAL LOW (ref 8.9–10.3)
Calcium: 7.1 mg/dL — ABNORMAL LOW (ref 8.9–10.3)
Chloride: 101 mmol/L (ref 98–111)
Chloride: 103 mmol/L (ref 98–111)
Creatinine, Ser: 2.21 mg/dL — ABNORMAL HIGH (ref 0.44–1.00)
Creatinine, Ser: 2.06 mg/dL — ABNORMAL HIGH (ref 0.44–1.00)
GFR calc Af Amer: 31 mL/min — ABNORMAL LOW (ref >60)
GFR calc Af Amer: 34 mL/min — ABNORMAL LOW (ref >60)
GFR calc non Af Amer: 27 mL/min — ABNORMAL LOW (ref >60)
GFR calc non Af Amer: 29 mL/min — ABNORMAL LOW (ref >60)
Glucose, Bld: 249 mg/dL — ABNORMAL HIGH (ref 70–99)
Glucose, Bld: 226 mg/dL — ABNORMAL HIGH (ref 70–99)
Potassium: 3.8 mmol/L (ref 3.5–5.1)
Potassium: 3.8 mmol/L (ref 3.5–5.1)
Sodium: 132 mmol/L — ABNORMAL LOW (ref 135–145)
Sodium: 132 mmol/L — ABNORMAL LOW (ref 135–145)

## 2019-06-12 LAB — CBC
HCT: 31.6 % — ABNORMAL LOW (ref 36.0–46.0)
Hemoglobin: 10.9 g/dL — ABNORMAL LOW (ref 12.0–15.0)
MCH: 28.2 pg (ref 26.0–34.0)
MCHC: 34.5 g/dL (ref 30.0–36.0)
MCV: 81.7 fL (ref 80.0–100.0)
Platelets: 55 10*3/uL — ABNORMAL LOW (ref 150–400)
RBC: 3.87 MIL/uL (ref 3.87–5.11)
RDW: 14.6 % (ref 11.5–15.5)
WBC: 6 10*3/uL (ref 4.0–10.5)
nRBC: 0 % (ref 0.0–0.2)

## 2019-06-12 LAB — PROCALCITONIN: Procalcitonin: 64.44 ng/mL

## 2019-06-12 LAB — LACTIC ACID, PLASMA
Lactic Acid, Venous: 3.1 mmol/L (ref 0.5–1.9)
Lactic Acid, Venous: 1.9 mmol/L (ref 0.5–1.9)

## 2019-06-12 LAB — PROTIME-INR
INR: 1.6 — ABNORMAL HIGH (ref 0.8–1.2)
Prothrombin Time: 18 seconds — ABNORMAL HIGH (ref 11.4–15.2)

## 2019-06-12 LAB — BETA-HYDROXYBUTYRIC ACID
Beta-Hydroxybutyric Acid: 0.26 mmol/L (ref 0.05–0.27)
Beta-Hydroxybutyric Acid: 0.17 mmol/L (ref 0.05–0.27)

## 2019-06-12 LAB — CORTISOL-AM, BLOOD: Cortisol - AM: 36.2 ug/dL — ABNORMAL HIGH (ref 6.7–22.6)

## 2019-06-12 MED ORDER — INSULIN GLARGINE 100 UNIT/ML ~~LOC~~ SOLN
32.0000 [IU] | Freq: Every day | SUBCUTANEOUS | Status: DC
  Administered 2019-06-12 – 2019-06-16 (×5): 32 [IU] via SUBCUTANEOUS
  Filled 2019-06-12 (×7): qty 0.32

## 2019-06-12 MED ORDER — MIDAZOLAM HCL 2 MG/2ML IJ SOLN
INTRAMUSCULAR | Status: AC
  Filled 2019-06-12: qty 2

## 2019-06-12 MED ORDER — ONDANSETRON HCL 4 MG PO TABS
4.0000 mg | ORAL_TABLET | Freq: Four times a day (QID) | ORAL | Status: DC | PRN
  Administered 2019-06-14 – 2019-06-16 (×3): 4 mg via ORAL
  Filled 2019-06-12 (×3): qty 1

## 2019-06-12 MED ORDER — VECURONIUM BROMIDE 10 MG IV SOLR
INTRAVENOUS | Status: AC
  Filled 2019-06-12: qty 10

## 2019-06-12 MED ORDER — ONDANSETRON HCL 4 MG/2ML IJ SOLN
INTRAMUSCULAR | Status: AC
  Filled 2019-06-12: qty 2

## 2019-06-12 MED ORDER — SODIUM CHLORIDE 0.9 % IV BOLUS
2000.0000 mL | Freq: Once | INTRAVENOUS | Status: AC
  Administered 2019-06-12: 2000 mL via INTRAVENOUS

## 2019-06-12 MED ORDER — ROCURONIUM BROMIDE 50 MG/5ML IV SOLN
INTRAVENOUS | Status: AC
  Filled 2019-06-12: qty 1

## 2019-06-12 MED ORDER — FENTANYL CITRATE (PF) 100 MCG/2ML IJ SOLN
INTRAMUSCULAR | Status: AC
  Filled 2019-06-12: qty 2

## 2019-06-12 MED ORDER — PROMETHAZINE HCL 25 MG/ML IJ SOLN
12.5000 mg | Freq: Four times a day (QID) | INTRAMUSCULAR | Status: DC | PRN
  Administered 2019-06-12 – 2019-06-14 (×2): 12.5 mg via INTRAVENOUS
  Filled 2019-06-12 (×2): qty 1

## 2019-06-12 MED ORDER — SODIUM CHLORIDE 0.9 % IV SOLN
8.0000 mg | Freq: Three times a day (TID) | INTRAVENOUS | Status: DC | PRN
  Administered 2019-06-12: 8 mg via INTRAVENOUS
  Filled 2019-06-12 (×2): qty 4

## 2019-06-12 MED ORDER — IPRATROPIUM-ALBUTEROL 0.5-2.5 (3) MG/3ML IN SOLN: 3.0000 mL | Freq: Four times a day (QID) | RESPIRATORY_TRACT | Status: DC | PRN

## 2019-06-12 MED ORDER — CHLORHEXIDINE GLUCONATE CLOTH 2 % EX PADS
6.0000 | MEDICATED_PAD | Freq: Every day | CUTANEOUS | Status: DC
  Administered 2019-06-13 – 2019-06-16 (×4): 6 via TOPICAL

## 2019-06-12 MED ORDER — SODIUM BICARBONATE 8.4 % IV SOLN
50.0000 meq | INTRAVENOUS | Status: AC
  Administered 2019-06-12: 50 meq via INTRAVENOUS
  Filled 2019-06-12: qty 50

## 2019-06-12 MED ORDER — SODIUM CHLORIDE 0.9 % IV BOLUS
1000.0000 mL | Freq: Once | INTRAVENOUS | Status: AC
  Administered 2019-06-12: 1000 mL via INTRAVENOUS

## 2019-06-12 MED ORDER — INSULIN ASPART 100 UNIT/ML ~~LOC~~ SOLN
3.0000 [IU] | SUBCUTANEOUS | Status: DC
  Administered 2019-06-12 – 2019-06-13 (×3): 6 [IU] via SUBCUTANEOUS
  Administered 2019-06-13 – 2019-06-14 (×5): 9 [IU] via SUBCUTANEOUS
  Administered 2019-06-14: 6 [IU] via SUBCUTANEOUS
  Administered 2019-06-14: 3 [IU] via SUBCUTANEOUS
  Administered 2019-06-14 (×2): 9 [IU] via SUBCUTANEOUS
  Administered 2019-06-14: 6 [IU] via SUBCUTANEOUS
  Administered 2019-06-15: 9 [IU] via SUBCUTANEOUS
  Administered 2019-06-15: 3 [IU] via SUBCUTANEOUS
  Administered 2019-06-15: 6 [IU] via SUBCUTANEOUS
  Administered 2019-06-15: 3 [IU] via SUBCUTANEOUS
  Administered 2019-06-15: 6 [IU] via SUBCUTANEOUS
  Administered 2019-06-16: 9 [IU] via SUBCUTANEOUS
  Administered 2019-06-16 (×2): 3 [IU] via SUBCUTANEOUS
  Filled 2019-06-12 (×21): qty 1

## 2019-06-12 NOTE — Telephone Encounter (Signed)
-----   Message from Jerilee Field, MD sent at 06/11/2019  6:31 PM EDT ----- I need to see back in about 2 weeks (s/p left stent placement). Thanks!

## 2019-06-12 NOTE — Plan of Care (Signed)
Per plan

## 2019-06-12 NOTE — Consult Note (Signed)
Name: Katelyn Barnes MRN: [id number] DOB: 05-20-1980    ADMISSION DATE:  06/11/2019 CONSULTATION DATE: 06/12/2019  REFERRING MD : Dr. Jamse Arn   CHIEF COMPLAINT: LLQ Pain   BRIEF PATIENT DESCRIPTION:  39 yo female admitted with DKA, acute renal failure, and septic shock secondary to left hydroureteronephrosis down to the left distal ureter requiring ureteral stent placement. 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 Moderate LEFT hydroureteronephrosis extending to near the bladder with equivocal slight transition change in the distal LEFT ureter 1 cm above the UVJ. No evidence of obstructing cause/urinary calculi. This hydroureteronephrosis may be caused by infection or possibly an obstructing process not visualized or passed calculus. Small to moderate amount of free fluid within the abdomen and pelvis. Aortic Atherosclerosis (ICD10-I70.0). Urology consulted  05/31: Pt underwent cystoscopy with left retrograde pyelogram, left diagnostic ureteroscopy, left ureteral stent placement 06/1: Pt transferred to ICU due to development of acute respiratory failure requiring Bipap   HISTORY OF PRESENT ILLNESS:   This is a 40 yo female with a PMH of DVT (following a back surgery in 2016 on eliquis), Type II Diabetes Mellitus, Lumbar DDD, and Morbid Obesity.  She presented to [facility] ER on 05/31 with c/o LLQ pain onset 2 days prior to presentation.  She also endorsed nausea and vomiting.  Lab results Na+ 130, CO2 15, glucose 427, BUN 27, creatinine 2.40, pct 64.44, lactic acid 3.8, pct 64.44, beta-hyrdroxy 0.57, hemoglobin A1c 13.2, UA + for UTI, and COVID-19 negative.  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 Pt noted to be hypotensive with sbp in the 90's concerning for sepsis.  She received levaquin, cefepime, and due to pt ruling in for DKA insulin gtt initiated. She was subsequently admitted to the stepdown unit per hospitalist team for additional workup and treatment.  Detailed hospital course outlined above under significant events/studies.    PAST MEDICAL HISTORY :   has a past medical history of DDD (degenerative disc disease), lumbar, Diabetes mellitus without complication ([place]), and DVT (deep venous thrombosis) ([place]) (2016).  has a past surgical history that includes Back surgery (2016); Tubal ligation (08/2007); Carpal tunnel release (Bilateral); Anterior cervical decomp/discectomy fusion (Right, 05/05/2016); and Cystoscopy with ureteroscopy and stent placement (Left, 06/11/2019). Prior to Admission medications   Medication Sig Start Date End Date Taking? Authorizing Provider  insulin glargine (LANTUS) 100 UNIT/ML injection Inject 30 Units into the skin at bedtime.   Yes [provider]  Insulin Glargine-Lixisenatide (SOLIQUA) 100-33 UNT-MCG/ML SOPN Inject 45 Units into the skin daily at 12 noon.   Yes [provider]  metFORMIN (GLUCOPHAGE) 1000 MG tablet Take 1,000 mg by mouth 2 (two) times daily with a meal.   Yes [provider]  topiramate (TOPAMAX) 100 MG tablet Take 100 mg by mouth 2 (two) times daily.   Yes [provider]  atorvastatin (LIPITOR) 40 MG tablet Take 40 mg by mouth every evening.  [provider]  gabapentin (NEURONTIN) 300 MG capsule Take 1,200 mg by mouth at bedtime.     [provider]  insulin aspart protamine- aspart (NOVOLOG MIX 70/30) (70-30) 100 UNIT/ML injection Inject 75-85 Units into the skin 2 (two) times daily with a meal. 85 units in the morning, 75 units at bedtime    [provider]  liraglutide (VICTOZA) 18 MG/3ML SOPN Inject 1.8 mg into the skin every morning.    [provider]  valACYclovir (VALTREX) 1000 MG tablet Take 1 tablet (1,000 mg total) by mouth 2 (two) times daily. 10/12/18   Tommi Rumps, PA-C   Allergies  Allergen Reactions  . Penicillins Rash         FAMILY HISTORY:  family history is not on file. SOCIAL HISTORY:  reports that she has never smoked. She has never used smokeless tobacco. She reports that she does not drink alcohol or use drugs.  REVIEW OF SYSTEMS: Positives in BOLD  Constitutional: fever, chills, weight loss, malaise/fatigue and diaphoresis.  HENT: Negative for hearing loss, ear pain, nosebleeds, congestion, sore throat, neck pain, tinnitus and ear discharge.   Eyes: Negative for blurred vision, double vision, photophobia, pain, discharge and redness.  Respiratory: Negative for cough, hemoptysis, sputum production, shortness of breath, wheezing and stridor.   Cardiovascular: Negative for chest pain, palpitations, orthopnea, claudication, leg swelling and PND.  Gastrointestinal: heartburn, nausea, vomiting, LLQ pain, diarrhea, constipation, blood in stool and melena.  Genitourinary: Negative for dysuria, urgency, frequency, hematuria and flank pain.  Musculoskeletal: Negative for myalgias, back pain, joint pain and falls.  Skin: Negative for itching and rash.  Neurological: Negative for dizziness, tingling, tremors, sensory change, speech change, focal weakness, seizures, loss of consciousness, weakness and headaches.  Endo/Heme/Allergies: Negative for environmental allergies and polydipsia. Does not bruise/bleed easily.  SUBJECTIVE:  No complaints at this time   VITAL  SIGNS: Temp:  [98.3 F (36.8 C)-101.5 F (38.6 C)] 98.3 F (36.8 C) (06/01 1600) Pulse Rate:  [121-157] 131 (06/01 1900) Resp:  [8-50] 31 (06/01 1900) BP: (71-127)/(48-96) 100/64 (06/01 1900) SpO2:  [86 %-100 %] 100 % (06/01 1900) FiO2 (%):  [100 %] 100 % (06/01 1722)  PHYSICAL EXAMINATION: General: acutely ill appearing female, NAD on Bipap  Neuro: lethargic and oriented, follows commands HEENT: supple, no JVD  Cardiovascular: sinus tach, no R/G Lungs: clear throughout, even, non labored  Abdomen: hypoactive BS x4, obese, soft, non distended, LLQ pain Musculoskeletal: normal bulk and tone, no edema  Skin: intact no rashes or lesions present   Recent Labs  Lab 06/11/19 1911 06/12/19 0121 06/12/19 0529  NA 133* 132* 132*  K 3.8 3.8 3.8  CL 106 101 103  CO2 13* 19* 19*  BUN 28* 32* 33*  CREATININE 2.02* 2.21* 2.06*  GLUCOSE 280* 249* 226*   Recent Labs  Lab 06/11/19 0852 06/12/19 0529  HGB 13.4 10.9*  HCT 39.1 31.6*  WBC 10.4 6.0  PLT 93* 55*   CT ABDOMEN PELVIS WO CONTRAST  Result Date: 06/11/2019 CLINICAL DATA:  39 year old female acute LEFT abdominal and pelvic pain. EXAM: CT ABDOMEN AND PELVIS WITHOUT CONTRAST TECHNIQUE: Multidetector CT imaging of the abdomen and pelvis was performed following the standard protocol without IV contrast. COMPARISON:  05/27/2013 CT FINDINGS: Please note that parenchymal abnormalities may be missed without intravenous contrast. Lower chest: No acute abnormality. Hepatobiliary: The liver and gallbladder are unremarkable. No biliary dilatation. Pancreas: Unremarkable Spleen: Unremarkable Adrenals/Urinary Tract: The LEFT kidney is diffusely enlarged and  there is moderate perinephric inflammation. A small amount of gas along the SUPERIOR aspect of the LEFT kidney is noted, highly suspicious for infection/emphysematous pyelonephritis. There is moderate LEFT hydroureteronephrosis extending to near the bladder within equivocal slight transition  change in the distal LEFT ureter 1 cm above the UVJ. There is no evidence of urinary calculi. Renal vasculature is not well evaluated without intravenous contrast. The RIGHT kidney, adrenal glands and bladder are unremarkable. Stomach/Bowel: Stomach is within normal limits. Appendix appears normal. No evidence of bowel wall thickening, distention, or inflammatory changes. Vascular/Lymphatic: Aortic atherosclerosis. Mildly enlarged LEFT perinephric lymph nodes are likely reactive. Reproductive: Uterus and bilateral adnexa are unremarkable except for tubal ligation clips. Other: A small to moderate amount of free fluid within the abdomen and pelvis noted. No evidence of pneumoperitoneum. Musculoskeletal: No acute or suspicious bony abnormalities. Degenerative changes in the lumbar spine again noted. IMPRESSION: 1. 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 Moderate LEFT hydroureteronephrosis extending to near the bladder with equivocal slight transition change in the distal LEFT ureter 1 cm above the UVJ. No evidence of obstructing cause/urinary calculi. This hydroureteronephrosis may be caused by infection or possibly an obstructing process not visualized or passed calculus. 2. Small to moderate amount of free fluid within the abdomen and pelvis. 3. Aortic Atherosclerosis (ICD10-I70.0. Critical Value/emergent results were called by telephone at the time of interpretation on 06/11/2019 at 11:20 am to provider [facility] , who verbally acknowledged these results. Electronically Signed   By: Harmon Pier M.D.   On: 06/11/2019 11:28   DG OR UROLOGY CYSTO IMAGE ([facility] ONLY)  Result Date: 06/11/2019 There is no interpretation for this exam.  This order is for images obtained during a surgical procedure.  Please See "Surgeries" Tab for more information regarding the procedure.     ASSESSMENT / PLAN:  Acute respiratory failure secondary to metabolic acidosis and atelectasis  Hx: Morbid Obesity  Supplemental O2 or Bipap for dyspnea and/or hypoxia  Prn bronchodilator therapy  1 amp of sodium bicarb  Aggressive pulmonary hygiene   Hypotension secondary to urosepsis  Hx: DVT on eliquis  Continuous telemetry monitoring  Aggressive fluid resuscitation  Continue outpatient atorvastatin   Acute renal failure with metabolic acidosis and lactic acidosis due to left hydronephrosis secondary to left ureteral stricture Trend BMP  Replace electrolytes Monitor UOP Avoid nephrotoxic medications Urology consulted appreciate input Continue indwelling foley catheter for now   UTI Trend WBC and monitor fever curve  Trend PCT and lactic acid Follow cultures  Continue meropenum   Anemia without obvious acute blood loss  VTE px: SCD's, hold outpatient eliquis for now  Trend CBC  Monitor for s/sx of bleeding and transfuse for hgb <7  Diabetic ketoacidosis-resolved  CBG's q4hrs  SSI and scheduled lantus  Diabetes coordinator consulted appreciate input   Acute pain  Prn morphine for pain management   Sonda Rumble, AGNP  Pulmonary/Critical Care Pager [phone] (please enter 7 digits) PCCM Consult Pager [phone] (please enter 7 digits)

## 2019-06-12 NOTE — Progress Notes (Signed)
Inpatient Diabetes Program Recommendations  AACE/ADA: New Consensus Statement on Inpatient Glycemic Control (2015)  Target Ranges:  Prepandial:   less than 140 mg/dL      Peak postprandial:   less than 180 mg/dL (1-2 hours)      Critically ill patients:  140 - 180 mg/dL   Lab Results  Component Value Date   GLUCAP 192 (H) 06/12/2019   HGBA1C 13.2 (H) 06/11/2019    Review of Glycemic Control  Diabetes history: DM2 Outpatient Diabetes medications: Lantus 45 units + Soliqua 45 (combination of Lantus + Lixisenatide) Current orders for Inpatient glycemic control: IV insulin  Inpatient Diabetes Program Recommendations:   When patient ready for transition from IV insulin drip, -Consider Lantus 32 units qd to be given 2 hrs prior to D/C of IV insulin drip. Cover CBG with Novolog correction @ time IV insulin discontinued.  Will follow during hospitalization and speak with pt.  Thank you, Billy Fischer. Emalyn Schou, RN, MSN, CDE  Diabetes Coordinator Inpatient Glycemic Control Team Team Pager [phone] (8am-5pm) 06/12/2019 1:44 PM

## 2019-06-12 NOTE — Progress Notes (Signed)
Urology Inpatient Progress Note  Subjective: Katelyn Barnes is a 39 y.o. 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  She is POD 1 from left ureteral stent placement with Dr. Mena Goes.  Intraoperative findings notable for left ureteral stricture.  WBC count down today, 6.0.  Creatinine down today, 2.06.  Blood cultures preliminary positive for E. coli, urine cultures pending.  On antibiotics as below.  Patient remains febrile and hypotensive in the ICU.  Today, patient reports not feeling well, however she has no acute concerns.  Anti-infectives: Anti-infectives (From admission, onward)   Start     Dose/Rate Route Frequency Ordered Stop   06/12/19 1000  ceFEPIme (MAXIPIME) 2 g in sodium chloride 0.9 % 100 mL IVPB  Status:  Discontinued     2 g 200 mL/hr over 30 Minutes Intravenous Every 12 hours 06/11/19 1403 06/11/19 1928   06/12/19 0800  meropenem (MERREM) 1 g in sodium chloride 0.9 % 100 mL IVPB  Status:  Discontinued     1 g 200 mL/hr over 30 Minutes Intravenous Every 12 hours 06/11/19 2315 06/11/19 2322   06/11/19 2330  meropenem (MERREM) 1 g in sodium chloride 0.9 % 100 mL IVPB     1 g 200 mL/hr over 30 Minutes Intravenous Every 12 hours 06/11/19 2322     06/11/19 2000  ceFEPIme (MAXIPIME) 2 g in sodium chloride 0.9 % 100 mL IVPB  Status:  Discontinued     2 g 200 mL/hr over 30 Minutes Intravenous Every 12 hours 06/11/19 1928 06/11/19 2315   06/11/19 1300  ceFEPIme (MAXIPIME) 2 g in sodium chloride 0.9 % 100 mL IVPB  Status:  Discontinued     2 g 200 mL/hr over 30 Minutes Intravenous  Once 06/11/19 1255 06/11/19 1255   06/11/19 1130  levofloxacin (LEVAQUIN) IVPB 750 mg     750 mg 100 mL/hr over 90 Minutes Intravenous  Once 06/11/19 1124 06/11/19 1335      Current Facility-Administered Medications  Medication Dose Route Frequency Provider Last Rate Last Admin   0.9 %  sodium  chloride infusion   Intravenous Continuous Jerilee Field, MD 125 mL/hr at 06/11/19 1336 New Bag at 06/11/19 1336   0.9 %  sodium chloride infusion   Intravenous Continuous Jerilee Field, MD       acetaminophen (TYLENOL) tablet 650 mg  650 mg Oral Q6H PRN Jerilee Field, MD   650 mg at 06/11/19 2241   Or   acetaminophen (TYLENOL) suppository 650 mg  650 mg Rectal Q6H PRN Jerilee Field, MD       atorvastatin (LIPITOR) tablet 40 mg  40 mg Oral QPM Jerilee Field, MD       Chlorhexidine Gluconate Cloth 2 % PADS 6 each  6 each Topical Daily Agbata, Tochukwu, MD       dextrose 5 %-0.45 % sodium chloride infusion   Intravenous Continuous Jerilee Field, MD 75 mL/hr at 06/12/19 0813 New Bag at 06/12/19 0813   dextrose 50 % solution 0-50 mL  0-50 mL Intravenous PRN Jerilee Field, MD       gabapentin (NEURONTIN) capsule 1,200 mg  1,200 mg Oral QHS Jerilee Field, MD       insulin regular, human (MYXREDLIN) 100 units/ 100 mL infusion   Intravenous Continuous Jerilee Field, MD 3.2 mL/hr at 06/12/19 0802 3.2 Units/hr at 06/12/19 0802   meropenem (MERREM) 1 g in sodium chloride 0.9 % 100 mL IVPB  1  g Intravenous Q12H Collier Bullock, MD   Stopped at 06/12/19 0048   morphine 2 MG/ML injection 1 mg  1 mg Intravenous Q4H PRN Festus Aloe, MD   1 mg at 06/12/19 0801   ondansetron (ZOFRAN) tablet 4 mg  4 mg Oral Q6H PRN Festus Aloe, MD       Or   ondansetron [facility]) injection 4 mg  4 mg Intravenous Q6H PRN Festus Aloe, MD   4 mg at 06/12/19 0801   sodium chloride 0.9 % bolus 2,000 mL  2,000 mL Intravenous Once Vashti Hey, MD       Objective: Vital signs in last 24 hours: Temp:  [97.5 F (36.4 C)-101.5 F (38.6 C)] 98.4 F (36.9 C) (06/01 0800) Pulse Rate:  [121-140] 122 (06/01 0800) Resp:  [8-43] 43 (06/01 0800) BP: (71-131)/(34-79) 95/79 (06/01 0800) SpO2:  [92 %-100 %] 93 % (06/01 0800)  Intake/Output from previous  day: 05/31 0701 - 06/01 0700 In: 6301.1 [P.O.:180; I.V.:1362; IV Piggyback:4759.1] Out: 1375 [Urine:1375] Intake/Output this shift: No intake/output data recorded.  Physical Exam Vitals and nursing note reviewed.  Constitutional:      Appearance: She is ill-appearing. She is not diaphoretic.  HENT:     Head: Normocephalic and atraumatic.  Pulmonary:     Effort: Pulmonary effort is normal. No respiratory distress.  Skin:    General: Skin is warm and dry.  Neurological:     Mental Status: She is oriented to person, place, and time.  Psychiatric:        Mood and Affect: Mood normal.        Behavior: Behavior normal.    Lab Results:  Recent Labs    06/11/19 0852 06/12/19 0529  WBC 10.4 6.0  HGB 13.4 10.9*  HCT 39.1 31.6*  PLT 93* 55*   BMET Recent Labs    06/12/19 0121 06/12/19 0529  NA 132* 132*  K 3.8 3.8  CL 101 103  CO2 19* 19*  GLUCOSE 249* 226*  BUN 32* 33*  CREATININE 2.21* 2.06*  CALCIUM 7.3* 7.1*   PT/INR Recent Labs    06/12/19 0529  LABPROT 18.0*  INR 1.6*   ABG Recent Labs    06/11/19 0846  HCO3 20.0   Assessment & Plan: 39 year old female admitted with urosepsis with left hydronephrosis, now s/p left ureteral stent placement with intraoperative findings of a left ureteral stricture, etiology undetermined.  Labs improving, however patient remains febrile and hypotensive.  Patient will require second look ureteroscopy once she clinically improves, likely on an outpatient basis.  We will plan to keep stent in place until that time.  Recommendations: -Agree with antibiotics, follow urine and blood cultures -If creatinine starts downtrending and patient fails to clinically improve, recommend renal ultrasound to assess appropriate stent function  Debroah Loop, PA-C 06/12/2019

## 2019-06-12 NOTE — Telephone Encounter (Signed)
App made 

## 2019-06-12 NOTE — Progress Notes (Signed)
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  Patient was seen by urology who went in and stented her ureter.  She was started on DKA protocol and cefepime.  Over the course of today patient had initially had hypotension although MAP is stayed greater than 60 and persistent sinus tachycardia.  She was fluid resuscitated aggressively.  Patient was noted to have Kussmaul breathing with respiratory rate in the mid 30s.  I was just called to see patient for respiratory rate in the 50s and hypoxia.  Patient was seen by PCCM who had placed her on BiPAP.  Patient seemed to be doing better with decreased respiratory rate although persistent tachypnea.  Blood pressure had responded well to fluid resuscitation and was 125/80.  Discussed with Dr. Karna Christmas, PCCM will assume care of the patient who will likely need intensive monitoring in the ICU overnight.  Continue BiPAP, cefepime and DKA protocol insulin drip.

## 2019-06-12 NOTE — Anesthesia Postprocedure Evaluation (Signed)
Anesthesia Post Note  Patient: Katelyn Barnes  Procedure(s) Performed: CYSTOSCOPY WITH URETEROSCOPY AND STENT PLACEMENT (Left )  Patient location during evaluation: SICU Anesthesia Type: General Level of consciousness: awake and alert Pain management: pain level controlled Vital Signs Assessment: post-procedure vital signs reviewed and stable Respiratory status: spontaneous breathing and patient connected to nasal cannula oxygen Cardiovascular status: stable Postop Assessment: no apparent nausea or vomiting Anesthetic complications: no     Last Vitals:  Vitals:   06/12/19 0600 06/12/19 0800  BP: 93/69 95/79  Pulse: (!) 122 (!) 122  Resp: (!) 28 (!) 43  Temp:  36.9 C  SpO2: 93% 93%    Last Pain:  Vitals:   06/12/19 0800  TempSrc: Oral  PainSc:                  Karoline Caldwell

## 2019-06-13 ENCOUNTER — Inpatient Hospital Stay: Payer: Medicaid Other

## 2019-06-13 LAB — GLUCOSE, CAPILLARY
Glucose-Capillary: 190 mg/dL — ABNORMAL HIGH (ref 70–99)
Glucose-Capillary: 194 mg/dL — ABNORMAL HIGH (ref 70–99)
Glucose-Capillary: 203 mg/dL — ABNORMAL HIGH (ref 70–99)
Glucose-Capillary: 206 mg/dL — ABNORMAL HIGH (ref 70–99)
Glucose-Capillary: 225 mg/dL — ABNORMAL HIGH (ref 70–99)
Glucose-Capillary: 239 mg/dL — ABNORMAL HIGH (ref 70–99)
Glucose-Capillary: 263 mg/dL — ABNORMAL HIGH (ref 70–99)

## 2019-06-13 LAB — COMPREHENSIVE METABOLIC PANEL
ALT: 36 U/L (ref 0–44)
AST: 36 U/L (ref 15–41)
Albumin: 2.5 g/dL — ABNORMAL LOW (ref 3.5–5.0)
Alkaline Phosphatase: 90 U/L (ref 38–126)
Anion gap: 8 (ref 5–15)
BUN: 34 mg/dL — ABNORMAL HIGH (ref 6–20)
CO2: 18 mmol/L — ABNORMAL LOW (ref 22–32)
Calcium: 6.9 mg/dL — ABNORMAL LOW (ref 8.9–10.3)
Chloride: 107 mmol/L (ref 98–111)
Creatinine, Ser: 1.73 mg/dL — ABNORMAL HIGH (ref 0.44–1.00)
GFR calc Af Amer: 42 mL/min — ABNORMAL LOW (ref >60)
GFR calc non Af Amer: 36 mL/min — ABNORMAL LOW (ref >60)
Glucose, Bld: 228 mg/dL — ABNORMAL HIGH (ref 70–99)
Potassium: 3.5 mmol/L (ref 3.5–5.1)
Sodium: 133 mmol/L — ABNORMAL LOW (ref 135–145)
Total Bilirubin: 2.7 mg/dL — ABNORMAL HIGH (ref 0.3–1.2)
Total Protein: 5.9 g/dL — ABNORMAL LOW (ref 6.5–8.1)

## 2019-06-13 LAB — URINE CULTURE: Culture: >=100000 — AB

## 2019-06-13 LAB — CBC
HCT: 32.8 % — ABNORMAL LOW (ref 36.0–46.0)
Hemoglobin: 11.3 g/dL — ABNORMAL LOW (ref 12.0–15.0)
MCH: 28.1 pg (ref 26.0–34.0)
MCHC: 34.5 g/dL (ref 30.0–36.0)
MCV: 81.6 fL (ref 80.0–100.0)
Platelets: 63 10*3/uL — ABNORMAL LOW (ref 150–400)
RBC: 4.02 MIL/uL (ref 3.87–5.11)
RDW: 14.8 % (ref 11.5–15.5)
WBC: 6.5 10*3/uL (ref 4.0–10.5)
nRBC: 0 % (ref 0.0–0.2)

## 2019-06-13 LAB — LACTIC ACID, PLASMA: Lactic Acid, Venous: 1.8 mmol/L (ref 0.5–1.9)

## 2019-06-13 LAB — PROCALCITONIN: Procalcitonin: 55.9 ng/mL

## 2019-06-13 LAB — PHOSPHORUS: Phosphorus: 2.5 mg/dL (ref 2.5–4.6)

## 2019-06-13 LAB — MAGNESIUM
Magnesium: 1.1 mg/dL — ABNORMAL LOW (ref 1.7–2.4)
Magnesium: 2.4 mg/dL (ref 1.7–2.4)

## 2019-06-13 MED ORDER — MAGNESIUM SULFATE 2 GM/50ML IV SOLN
2.0000 g | Freq: Once | INTRAVENOUS | Status: AC
  Administered 2019-06-13: 2 g via INTRAVENOUS
  Filled 2019-06-13: qty 50

## 2019-06-13 MED ORDER — SODIUM CHLORIDE 0.9 % IV SOLN
1.0000 g | Freq: Three times a day (TID) | INTRAVENOUS | Status: DC
  Administered 2019-06-13 – 2019-06-14 (×3): 1 g via INTRAVENOUS
  Filled 2019-06-13 (×5): qty 1

## 2019-06-13 MED ORDER — MAGNESIUM SULFATE 4 GM/100ML IV SOLN
4.0000 g | Freq: Once | INTRAVENOUS | Status: AC
  Administered 2019-06-13: 4 g via INTRAVENOUS
  Filled 2019-06-13: qty 100

## 2019-06-13 NOTE — Plan of Care (Signed)
Plan of care is progressing. Pt on continuous Bipap through night, work of breathing decreased. . Multiple Bm's. Pt able to ask for bedpan.

## 2019-06-14 ENCOUNTER — Inpatient Hospital Stay: Payer: Medicaid Other

## 2019-06-14 LAB — BASIC METABOLIC PANEL
Anion gap: 8 (ref 5–15)
BUN: 29 mg/dL — ABNORMAL HIGH (ref 6–20)
CO2: 18 mmol/L — ABNORMAL LOW (ref 22–32)
Calcium: 7.3 mg/dL — ABNORMAL LOW (ref 8.9–10.3)
Chloride: 103 mmol/L (ref 98–111)
Creatinine, Ser: 1.21 mg/dL — ABNORMAL HIGH (ref 0.44–1.00)
GFR calc Af Amer: >60 mL/min (ref >60)
GFR calc non Af Amer: 56 mL/min — ABNORMAL LOW (ref >60)
Glucose, Bld: 181 mg/dL — ABNORMAL HIGH (ref 70–99)
Potassium: 3.1 mmol/L — ABNORMAL LOW (ref 3.5–5.1)
Sodium: 129 mmol/L — ABNORMAL LOW (ref 135–145)

## 2019-06-14 LAB — BLOOD GAS, ARTERIAL
Acid-base deficit: 9.5 mmol/L — ABNORMAL HIGH (ref 0.0–2.0)
Bicarbonate: 16.4 mmol/L — ABNORMAL LOW (ref 20.0–28.0)
FIO2: 0.36
O2 Saturation: 83.7 %
Patient temperature: 37.2
pCO2 arterial: 35 mmHg (ref 32.0–48.0)
pH, Arterial: 7.28 — ABNORMAL LOW (ref 7.350–7.450)
pO2, Arterial: 56 mmHg — ABNORMAL LOW (ref 83.0–108.0)

## 2019-06-14 LAB — PROCALCITONIN: Procalcitonin: 29.69 ng/mL

## 2019-06-14 LAB — GLUCOSE, CAPILLARY
Glucose-Capillary: 142 mg/dL — ABNORMAL HIGH (ref 70–99)
Glucose-Capillary: 145 mg/dL — ABNORMAL HIGH (ref 70–99)
Glucose-Capillary: 162 mg/dL — ABNORMAL HIGH (ref 70–99)
Glucose-Capillary: 179 mg/dL — ABNORMAL HIGH (ref 70–99)
Glucose-Capillary: 207 mg/dL — ABNORMAL HIGH (ref 70–99)
Glucose-Capillary: 210 mg/dL — ABNORMAL HIGH (ref 70–99)

## 2019-06-14 LAB — CBC
HCT: 31.8 % — ABNORMAL LOW (ref 36.0–46.0)
Hemoglobin: 10.7 g/dL — ABNORMAL LOW (ref 12.0–15.0)
MCH: 27.9 pg (ref 26.0–34.0)
MCHC: 33.6 g/dL (ref 30.0–36.0)
MCV: 82.8 fL (ref 80.0–100.0)
Platelets: 86 10*3/uL — ABNORMAL LOW (ref 150–400)
RBC: 3.84 MIL/uL — ABNORMAL LOW (ref 3.87–5.11)
RDW: 15.2 % (ref 11.5–15.5)
WBC: 7.3 10*3/uL (ref 4.0–10.5)
nRBC: 0 % (ref 0.0–0.2)

## 2019-06-14 LAB — CULTURE, BLOOD (ROUTINE X 2)

## 2019-06-14 LAB — BLOOD GAS, VENOUS
Acid-base deficit: 5.2 mmol/L — ABNORMAL HIGH (ref 0.0–2.0)
Bicarbonate: 20 mmol/L (ref 20.0–28.0)
O2 Saturation: 48 %
Patient temperature: 37
pCO2, Ven: 37 mmHg — ABNORMAL LOW (ref 44.0–60.0)
pH, Ven: 7.34 (ref 7.250–7.430)

## 2019-06-14 MED ORDER — CIPROFLOXACIN HCL 500 MG PO TABS
500.0000 mg | ORAL_TABLET | Freq: Two times a day (BID) | ORAL | Status: DC
  Administered 2019-06-14 – 2019-06-16 (×5): 500 mg via ORAL
  Filled 2019-06-14 (×7): qty 1

## 2019-06-14 MED ORDER — POTASSIUM CHLORIDE CRYS ER 20 MEQ PO TBCR
40.0000 meq | EXTENDED_RELEASE_TABLET | Freq: Once | ORAL | Status: AC
  Administered 2019-06-14: 40 meq via ORAL
  Filled 2019-06-14: qty 2

## 2019-06-14 NOTE — Progress Notes (Signed)
Name: Katelyn Barnes MRN: [id number] DOB: 12-Feb-1980    ADMISSION DATE:  06/11/2019 CONSULTATION DATE: 06/12/2019  REFERRING MD : Dr. Luberta Robertson   CHIEF COMPLAINT: LLQ Pain   BRIEF PATIENT DESCRIPTION:  39 yo female admitted with DKA, acute renal failure, and septic shock secondary to left hydroureteronephrosis down to the left distal ureter requiring ureteral stent placement. 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 Moderate LEFT hydroureteronephrosis extending to near the bladder with equivocal slight transition change in the distal LEFT ureter 1 cm above the UVJ. No evidence of obstructing cause/urinary calculi. This hydroureteronephrosis may be caused by infection or possibly an obstructing process not visualized or passed calculus. Small to moderate amount of free fluid within the abdomen and pelvis. Aortic Atherosclerosis (ICD10-I70.0). Urology consulted  05/31: Pt underwent cystoscopy with left retrograde pyelogram, left diagnostic ureteroscopy, left ureteral stent placement 06/1: Pt transferred to ICU due to development of acute respiratory failure requiring Bipap  06/13/19- patient is clinically improved with BIPAP on FiO2 30%, she is lucid and appropriate. On PO antibiotics only.   HISTORY OF PRESENT ILLNESS:   This is a 39 yo female with a PMH of DVT (following a back surgery in 2016 on eliquis), Type II Diabetes Mellitus, Lumbar DDD, and Morbid Obesity.  She presented to [facility] ER on 05/31 with c/o LLQ pain onset 2 days prior to presentation.  She also endorsed nausea and vomiting.  Lab results Na+ 130, CO2 15, glucose 427, BUN 27, creatinine 2.40, pct 64.44, lactic acid 3.8, pct 64.44,  beta-hyrdroxy 0.57, hemoglobin A1c 13.2, UA + for UTI, and COVID-19 negative.  CT Abd Pelvis diffuse left renal enlargement with gas along the superior aspect of the left kidney; highly suspicious for diffuse infection; emphysematous pyelonephritis of the upper left kidney; moderate left hydroureteronephrosis extending to near the bladder with equivocal slight transition change in the distal LEFT ureter 1 cm above the UVJ; and no evidence of obstructing cause/urinary calculi. Pt noted to be hypotensive with sbp in the 90's concerning for sepsis.  She received levaquin, cefepime, and due to pt ruling in for DKA insulin gtt initiated. She was subsequently admitted to the stepdown unit per hospitalist team for additional workup and treatment.  Detailed hospital course outlined above under significant events/studies.    PAST MEDICAL HISTORY :   has a past medical history of DDD (degenerative disc disease), lumbar, Diabetes mellitus without complication (HCC), and DVT (deep venous thrombosis) (HCC) (2016).  has a past surgical history that includes Back surgery (2016); Tubal ligation (08/2007); Carpal tunnel release (Bilateral); Anterior cervical decomp/discectomy fusion (Right, 05/05/2016); and Cystoscopy with ureteroscopy and stent placement (Left, 06/11/2019). Prior to Admission medications   Medication Sig Start Date End Date Taking? Authorizing Provider  insulin glargine (LANTUS) 100 UNIT/ML injection Inject 30 Units into the skin at bedtime.   Yes [provider]  Insulin Glargine-Lixisenatide (SOLIQUA) 100-33 UNT-MCG/ML SOPN Inject 45 Units into the skin daily at 12 noon.   Yes [provider]  metFORMIN (GLUCOPHAGE) 1000 MG tablet Take 1,000 mg by mouth 2 (two) times daily with a meal.   Yes [provider]  topiramate (TOPAMAX) 100 MG tablet Take 100 mg by mouth 2 (two) times daily.  Yes [provider]  atorvastatin (LIPITOR) 40 MG tablet Take 40 mg by mouth every  evening.    [provider]  gabapentin (NEURONTIN) 300 MG capsule Take 1,200 mg by mouth at bedtime.    [provider]  insulin aspart protamine- aspart (NOVOLOG MIX 70/30) (70-30) 100 UNIT/ML injection Inject 75-85 Units into the skin 2 (two) times daily with a meal. 85 units in the morning, 75 units at bedtime    [provider]  liraglutide (VICTOZA) 18 MG/3ML SOPN Inject 1.8 mg into the skin every morning.    [provider]  valACYclovir (VALTREX) 1000 MG tablet Take 1 tablet (1,000 mg total) by mouth 2 (two) times daily. 10/12/18   Johnn Hai, PA-C   Allergies  Allergen Reactions  . Penicillins Rash         FAMILY HISTORY:  family history is not on file. SOCIAL HISTORY:  reports that she has never smoked. She has never used smokeless tobacco. She reports that she does not drink alcohol or use drugs.  REVIEW OF SYSTEMS: Positives in BOLD  Constitutional: fever, chills, weight loss, malaise/fatigue and diaphoresis.  HENT: Negative for hearing loss, ear pain, nosebleeds, congestion, sore throat, neck pain, tinnitus and ear discharge.   Eyes: Negative for blurred vision, double vision, photophobia, pain, discharge and redness.  Respiratory: Negative for cough, hemoptysis, sputum production, shortness of breath, wheezing and stridor.   Cardiovascular: Negative for chest pain, palpitations, orthopnea, claudication, leg swelling and PND.  Gastrointestinal: heartburn, nausea, vomiting, LLQ pain, diarrhea, constipation, blood in stool and melena.  Genitourinary: Negative for dysuria, urgency, frequency, hematuria and flank pain.  Musculoskeletal: Negative for myalgias, back pain, joint pain and falls.  Skin: Negative for itching and rash.  Neurological: Negative for dizziness, tingling, tremors, sensory change, speech change, focal weakness, seizures, loss of consciousness, weakness and headaches.  Endo/Heme/Allergies: Negative for environmental  allergies and polydipsia. Does not bruise/bleed easily.  SUBJECTIVE:  No complaints at this time   VITAL SIGNS: Temp:  [98.7 F (37.1 C)-99.8 F (37.7 C)] 99.5 F (37.5 C) (06/03 0800) Pulse Rate:  [100-125] 117 (06/03 1350) Resp:  [13-42] 37 (06/03 1350) BP: (96-141)/(60-102) 107/83 (06/03 1100) SpO2:  [92 %-100 %] 95 % (06/03 1350) Weight:  [119.1 kg] 119.1 kg (06/03 0400)  PHYSICAL EXAMINATION: General: acutely ill appearing female, NAD on Bipap  Neuro: lethargic and oriented, follows commands HEENT: supple, no JVD  Cardiovascular: sinus tach, no R/G Lungs: clear throughout, even, non labored  Abdomen: hypoactive BS x4, obese, soft, non distended, LLQ pain Musculoskeletal: normal bulk and tone, no edema  Skin: intact no rashes or lesions present   Recent Labs  Lab 06/12/19 0529 06/13/19 0023 06/14/19 0427  NA 132* 133* 129*  K 3.8 3.5 3.1*  CL 103 107 103  CO2 19* 18* 18*  BUN 33* 34* 29*  CREATININE 2.06* 1.73* 1.21*  GLUCOSE 226* 228* 181*   Recent Labs  Lab 06/12/19 0529 06/13/19 0023 06/14/19 0427  HGB 10.9* 11.3* 10.7*  HCT 31.6* 32.8* 31.8*  WBC 6.0 6.5 7.3  PLT 55* 63* 86*   US RENAL  Result Date: 06/13/2019 CLINICAL DATA:  Acute renal failure EXAM: RENAL / URINARY TRACT ULTRASOUND COMPLETE COMPARISON:  Abdominal CT 06/11/2019 FINDINGS: Right Kidney: Renal measurements: 15 x 6 x 7 cm = volume: 304 mL . Echogenicity within normal limits. No mass or hydronephrosis visualized. Left Kidney: Renal measurements: 15.5 x 8 x 7 cm = volume: 434 mL. Extensive  shadowing echogenic appearance at the upper to interpolar region, greater than seen on preceding CT. It is somewhat difficult to reassess hydronephrosis given the degree of gas. No hydronephrosis is seen. Bladder: Decompressed by Foley catheter. IMPRESSION: 1. 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 Hydronephrosis is no  longer seen. 2. Bilateral nephromegaly which may reflect diabetic nephropathy with asymmetric greater left enlargement due to the pyelonephritis. Electronically Signed   By: Marnee Spring M.D.   On: 06/13/2019 10:55   DG Chest Port 1 View  Result Date: 06/14/2019 CLINICAL DATA:  Respiratory failure. EXAM: PORTABLE CHEST 1 VIEW COMPARISON:  06/12/2019. FINDINGS: Mediastinum and hilar structures normal. Low lung volumes with bibasilar atelectasis. No pleural effusion or pneumothorax. Cardiomegaly. No pulmonary venous congestion. Prior cervicothoracic spine fusion. IMPRESSION: 1.  Low lung volumes with mild bibasilar atelectasis. 2.  Cardiomegaly.  No pulmonary venous congestion. Electronically Signed   By: Maisie Fus  Register   On: 06/14/2019 07:48   DG Chest Port 1 View  Result Date: 06/12/2019 CLINICAL DATA:  Acute respiratory failure EXAM: PORTABLE CHEST 1 VIEW COMPARISON:  04/02/2016 FINDINGS: Cardiac shadow is stable. The lungs are hypoinflated with mild left basilar atelectasis. Postsurgical changes are noted in the lower cervical spine. No acute bony abnormality is seen. IMPRESSION: Mild left basilar atelectasis. Poor inspiratory effort. Electronically Signed   By: Alcide Clever M.D.   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 -urology on case - s/p ureteral stent placement Trend BMP  Replace electrolytes Monitor UOP Avoid nephrotoxic medications  Anemia without obvious acute blood loss  VTE px: SCD's, hold outpatient eliquis for now  Trend CBC  Monitor for s/sx of bleeding and transfuse for hgb <7  Diabetic ketoacidosis-resolved  CBG's q4hrs  SSI and scheduled lantus  Diabetes coordinator consulted appreciate input   Acute pain  Prn morphine for pain management    Critical  care provider statement:    Critical care time (minutes):  35   Critical care time was exclusive of:  Separately billable procedures and  treating other patients   Critical care was necessary to treat or prevent imminent or  life-threatening deterioration of the following conditions:  septic shock, UTI, bacteremia, anemia   Critical care was time spent personally by me on the following  activities:  Development of treatment plan with patient or surrogate,  discussions with consultants, evaluation of patient's response to  treatment, examination of patient, obtaining history from patient or  surrogate, ordering and performing treatments and interventions, ordering  and review of laboratory studies and re-evaluation of patient's condition   I assumed direction of critical care for this patient from another  provider in my specialty: no      Vida Rigger, M.D.  Pulmonary & Critical Care Medicine  [facility]

## 2019-06-15 LAB — BASIC METABOLIC PANEL
Anion gap: 8 (ref 5–15)
BUN: 23 mg/dL — ABNORMAL HIGH (ref 6–20)
CO2: 19 mmol/L — ABNORMAL LOW (ref 22–32)
Calcium: 7.3 mg/dL — ABNORMAL LOW (ref 8.9–10.3)
Chloride: 104 mmol/L (ref 98–111)
Creatinine, Ser: 1.11 mg/dL — ABNORMAL HIGH (ref 0.44–1.00)
GFR calc Af Amer: >60 mL/min (ref >60)
GFR calc non Af Amer: >60 mL/min (ref >60)
Glucose, Bld: 146 mg/dL — ABNORMAL HIGH (ref 70–99)
Potassium: 3 mmol/L — ABNORMAL LOW (ref 3.5–5.1)
Sodium: 131 mmol/L — ABNORMAL LOW (ref 135–145)

## 2019-06-15 LAB — CBC WITH DIFFERENTIAL/PLATELET
Abs Immature Granulocytes: 0.09 10*3/uL — ABNORMAL HIGH (ref 0.00–0.07)
Basophils Absolute: 0.1 10*3/uL (ref 0.0–0.1)
Basophils Relative: 1 %
Eosinophils Absolute: 0.1 10*3/uL (ref 0.0–0.5)
Eosinophils Relative: 1 %
HCT: 30.1 % — ABNORMAL LOW (ref 36.0–46.0)
Hemoglobin: 9.5 g/dL — ABNORMAL LOW (ref 12.0–15.0)
Immature Granulocytes: 1 %
Lymphocytes Relative: 12 %
Lymphs Abs: 0.9 10*3/uL (ref 0.7–4.0)
MCH: 27.3 pg (ref 26.0–34.0)
MCHC: 31.6 g/dL (ref 30.0–36.0)
MCV: 86.5 fL (ref 80.0–100.0)
Monocytes Absolute: 0.8 10*3/uL (ref 0.1–1.0)
Monocytes Relative: 10 %
Neutro Abs: 5.6 10*3/uL (ref 1.7–7.7)
Neutrophils Relative %: 75 %
Platelets: 61 10*3/uL — ABNORMAL LOW (ref 150–400)
RBC: 3.48 MIL/uL — ABNORMAL LOW (ref 3.87–5.11)
RDW: 15.7 % — ABNORMAL HIGH (ref 11.5–15.5)
WBC: 7.4 10*3/uL (ref 4.0–10.5)
nRBC: 0 % (ref 0.0–0.2)

## 2019-06-15 LAB — GLUCOSE, CAPILLARY
Glucose-Capillary: 143 mg/dL — ABNORMAL HIGH (ref 70–99)
Glucose-Capillary: 128 mg/dL — ABNORMAL HIGH (ref 70–99)
Glucose-Capillary: 214 mg/dL — ABNORMAL HIGH (ref 70–99)
Glucose-Capillary: 183 mg/dL — ABNORMAL HIGH (ref 70–99)
Glucose-Capillary: 182 mg/dL — ABNORMAL HIGH (ref 70–99)

## 2019-06-15 LAB — MAGNESIUM: Magnesium: 1.8 mg/dL (ref 1.7–2.4)

## 2019-06-15 LAB — PROCALCITONIN: Procalcitonin: 13.02 ng/mL

## 2019-06-15 MED ORDER — POTASSIUM CHLORIDE CRYS ER 20 MEQ PO TBCR
40.0000 meq | EXTENDED_RELEASE_TABLET | ORAL | Status: AC
  Administered 2019-06-15 (×2): 40 meq via ORAL
  Filled 2019-06-15 (×2): qty 2

## 2019-06-15 MED ORDER — HYDROCODONE-ACETAMINOPHEN 5-325 MG PO TABS
1.0000 | ORAL_TABLET | Freq: Four times a day (QID) | ORAL | Status: DC | PRN
  Administered 2019-06-15 – 2019-06-16 (×4): 1 via ORAL
  Filled 2019-06-15 (×4): qty 1

## 2019-06-15 MED ORDER — MORPHINE SULFATE (PF) 2 MG/ML IV SOLN: 1.0000 mg | INTRAVENOUS | Status: DC | PRN

## 2019-06-15 NOTE — Care Management (Signed)
This is a no charge note  Pick up from PCCM per Dr. Karna Christmas  39 year old lady with past medical history of diabetes mellitus, DVT, who is admitted to ICU due to septic shock secondary to pyelonephritis and left hydroureteronephrosis down to the left distal ureter requiring ureteral stent placement.  Patient also has E. coli bacteremia.  She is off vasopressors now.  Blood pressure 123/79. She developed acute respiratory failure requiring Bipap. She also had DKA which has resolved. We need to pick up tomorrow.    Lorretta Harp, MD  [facility]   If 7PM-7AM, please contact night-coverage [email or web address] 06/15/2019, 1:06 PM

## 2019-06-15 NOTE — Progress Notes (Signed)
Name: Katelyn Barnes MRN: [id number] DOB: 09/22/1980    ADMISSION DATE:  06/11/2019 CONSULTATION DATE: 06/12/2019  REFERRING MD : Dr. Luberta Robertson   CHIEF COMPLAINT: LLQ Pain   BRIEF PATIENT DESCRIPTION:  39 yo female admitted with DKA, acute renal failure, and septic shock secondary to left hydroureteronephrosis down to the left distal ureter requiring ureteral stent placement. 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 Moderate LEFT hydroureteronephrosis extending to near the bladder with equivocal slight transition change in the distal LEFT ureter 1 cm above the UVJ. No evidence of obstructing cause/urinary calculi. This hydroureteronephrosis may be caused by infection or possibly an obstructing process not visualized or passed calculus. Small to moderate amount of free fluid within the abdomen and pelvis. Aortic Atherosclerosis (ICD10-I70.0). Urology consulted  05/31: Pt underwent cystoscopy with left retrograde pyelogram, left diagnostic ureteroscopy, left ureteral stent placement 06/1: Pt transferred to ICU due to development of acute respiratory failure requiring Bipap  06/13/19- patient is clinically improved with BIPAP on FiO2 30%, she is lucid and appropriate. On PO antibiotics only.  06/15/19- patient clinically improved, will downgrade to stepdown and transfer to [facility]    HISTORY OF PRESENT ILLNESS:   This is a 39 yo female with a PMH of DVT (following a back surgery in 2016 on eliquis), Type II Diabetes Mellitus, Lumbar DDD, and Morbid Obesity.  She presented to [facility] ER on 05/31 with c/o LLQ pain onset 2 days prior to presentation.  She also endorsed nausea and vomiting.  Lab results Na+  130, CO2 15, glucose 427, BUN 27, creatinine 2.40, pct 64.44, lactic acid 3.8, pct 64.44, beta-hyrdroxy 0.57, hemoglobin A1c 13.2, UA + for UTI, and COVID-19 negative.  CT Abd Pelvis diffuse left renal enlargement with gas along the superior aspect of the left kidney; highly suspicious for diffuse infection; emphysematous pyelonephritis of the upper left kidney; moderate left hydroureteronephrosis extending to near the bladder with equivocal slight transition change in the distal LEFT ureter 1 cm above the UVJ; and no evidence of obstructing cause/urinary calculi. Pt noted to be hypotensive with sbp in the 90's concerning for sepsis.  She received levaquin, cefepime, and due to pt ruling in for DKA insulin gtt initiated. She was subsequently admitted to the stepdown unit per hospitalist team for additional workup and treatment.  Detailed hospital course outlined above under significant events/studies.    PAST MEDICAL HISTORY :   has a past medical history of DDD (degenerative disc disease), lumbar, Diabetes mellitus without complication (HCC), and DVT (deep venous thrombosis) (HCC) (2016).  has a past surgical history that includes Back surgery (2016); Tubal ligation (08/2007); Carpal tunnel release (Bilateral); Anterior cervical decomp/discectomy fusion (Right, 05/05/2016); and Cystoscopy with ureteroscopy and stent placement (Left, 06/11/2019). Prior to Admission medications   Medication Sig Start Date End Date Taking? Authorizing Provider  insulin glargine (LANTUS) 100 UNIT/ML injection Inject 30 Units into the skin at bedtime.   Yes [provider]  Insulin Glargine-Lixisenatide (SOLIQUA) 100-33 UNT-MCG/ML SOPN Inject 45 Units into the skin daily at 12 noon.   Yes [provider]  metFORMIN (GLUCOPHAGE) 1000 MG tablet Take 1,000 mg by mouth 2 (two) times daily with a meal.   Yes [provider]  topiramate (TOPAMAX)  100 MG tablet Take 100 mg by mouth 2 (two) times daily.   Yes  [provider]  atorvastatin (LIPITOR) 40 MG tablet Take 40 mg by mouth every evening.    [provider]  gabapentin (NEURONTIN) 300 MG capsule Take 1,200 mg by mouth at bedtime.    [provider]  insulin aspart protamine- aspart (NOVOLOG MIX 70/30) (70-30) 100 UNIT/ML injection Inject 75-85 Units into the skin 2 (two) times daily with a meal. 85 units in the morning, 75 units at bedtime    [provider]  liraglutide (VICTOZA) 18 MG/3ML SOPN Inject 1.8 mg into the skin every morning.    [provider]  valACYclovir (VALTREX) 1000 MG tablet Take 1 tablet (1,000 mg total) by mouth 2 (two) times daily. 10/12/18   Tommi Rumps, PA-C   Allergies  Allergen Reactions  . Penicillins Rash         FAMILY HISTORY:  family history is not on file. SOCIAL HISTORY:  reports that she has never smoked. She has never used smokeless tobacco. She reports that she does not drink alcohol or use drugs.  REVIEW OF SYSTEMS: Positives in BOLD  Constitutional: fever, chills, weight loss, malaise/fatigue and diaphoresis.  HENT: Negative for hearing loss, ear pain, nosebleeds, congestion, sore throat, neck pain, tinnitus and ear discharge.   Eyes: Negative for blurred vision, double vision, photophobia, pain, discharge and redness.  Respiratory: Negative for cough, hemoptysis, sputum production, shortness of breath, wheezing and stridor.   Cardiovascular: Negative for chest pain, palpitations, orthopnea, claudication, leg swelling and PND.  Gastrointestinal: heartburn, nausea, vomiting, LLQ pain, diarrhea, constipation, blood in stool and melena.  Genitourinary: Negative for dysuria, urgency, frequency, hematuria and flank pain.  Musculoskeletal: Negative for myalgias, back pain, joint pain and falls.  Skin: Negative for itching and rash.  Neurological: Negative for dizziness, tingling, tremors, sensory change, speech change, focal weakness, seizures, loss of  consciousness, weakness and headaches.  Endo/Heme/Allergies: Negative for environmental allergies and polydipsia. Does not bruise/bleed easily.  SUBJECTIVE:  No complaints at this time   VITAL SIGNS: Temp:  [98.3 F (36.8 C)-99.8 F (37.7 C)] 98.4 F (36.9 C) (06/04 0800) Pulse Rate:  [99-117] 100 (06/04 1100) Resp:  [20-37] 23 (06/04 1100) BP: (96-131)/(65-88) 123/79 (06/04 1100) SpO2:  [95 %-100 %] 98 % (06/04 1100) FiO2 (%):  [35 %] 35 % (06/03 1600) Weight:  [131.9 kg] 131.9 kg (06/04 0500)  PHYSICAL EXAMINATION: General: acutely ill appearing female, NAD on Bipap  Neuro: lethargic and oriented, follows commands HEENT: supple, no JVD  Cardiovascular: sinus tach, no R/G Lungs: clear throughout, even, non labored  Abdomen: hypoactive BS x4, obese, soft, non distended, LLQ pain Musculoskeletal: normal bulk and tone, no edema  Skin: intact no rashes or lesions present   Recent Labs  Lab 06/13/19 0023 06/14/19 0427 06/15/19 0648  NA 133* 129* 131*  K 3.5 3.1* 3.0*  CL 107 103 104  CO2 18* 18* 19*  BUN 34* 29* 23*  CREATININE 1.73* 1.21* 1.11*  GLUCOSE 228* 181* 146*   Recent Labs  Lab 06/13/19 0023 06/14/19 0427 06/15/19 0648  HGB 11.3* 10.7* 9.5*  HCT 32.8* 31.8* 30.1*  WBC 6.5 7.3 7.4  PLT 63* 86* 61*   DG Chest Port 1 View  Result Date: 06/14/2019 CLINICAL DATA:  Respiratory failure. EXAM: PORTABLE CHEST 1 VIEW COMPARISON:  06/12/2019. FINDINGS: Mediastinum and hilar structures normal. Low lung volumes with bibasilar atelectasis. No pleural effusion or pneumothorax. Cardiomegaly. No  pulmonary venous congestion. Prior cervicothoracic spine fusion. IMPRESSION: 1.  Low lung volumes with mild bibasilar atelectasis. 2.  Cardiomegaly.  No pulmonary venous congestion. 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 by ureteral stricture -urology on case - s/p ureteral stent placement Trend BMP  Replace electrolytes Monitor UOP Avoid nephrotoxic medications  Anemia without obvious acute blood loss  VTE px: SCD's, hold outpatient eliquis for now  Trend CBC  Monitor for s/sx of bleeding and transfuse for hgb <7  Diabetic ketoacidosis-resolved  CBG's q4hrs  SSI and scheduled lantus  Diabetes coordinator consulted appreciate input   Acute pain  Prn morphine for pain management    Critical care provider statement:    Critical care time (minutes):  35   Critical care time was exclusive of:  Separately billable procedures and  treating other patients   Critical care was necessary to treat or prevent imminent or  life-threatening deterioration of the following conditions:  septic shock, UTI, bacteremia, anemia   Critical care was time spent personally by me on the following  activities:  Development of treatment plan with patient or surrogate,  discussions with consultants, evaluation of patient's response to  treatment, examination of patient, obtaining history from patient or  surrogate, ordering and performing treatments and interventions, ordering  and review of laboratory studies and re-evaluation of patient's condition   I assumed direction of critical care for this patient from another  provider in my specialty: no      Ottie Glazier, M.D.  [facility] [place]

## 2019-06-15 NOTE — Progress Notes (Addendum)
Patient remains AOx4.  She still has pain in her LLQ d/t the infection of her ureter where stent was placed.  Her vitals remain WDL.  She wears BiPAP at night. She wears 3 L [employer] during the day saturating in the mid 90's.  She ambulated a total of 628ft today around the nurses unit.  Husband remains at bedside.  She was delivered morphine x 2 times for pain.  She also received zophran 4mg  PO for nausea.  Foley still in place. Urine is an .  She is now floor status.  Received verbal order Ofirmev (tylenol IV) 1000mg  q6hr PRN pain.  Verified order with Electrical engineer NP; order changed to PO Narcotic and reduction in interval of morphine IV delivery.

## 2019-06-16 DIAGNOSIS — J9601 Acute respiratory failure with hypoxia: Secondary | ICD-10-CM

## 2019-06-16 DIAGNOSIS — R739 Hyperglycemia, unspecified: Secondary | ICD-10-CM

## 2019-06-16 DIAGNOSIS — E662 Morbid (severe) obesity with alveolar hypoventilation: Secondary | ICD-10-CM

## 2019-06-16 LAB — BASIC METABOLIC PANEL
Anion gap: 5 (ref 5–15)
BUN: 18 mg/dL (ref 6–20)
CO2: 21 mmol/L — ABNORMAL LOW (ref 22–32)
Calcium: 7.4 mg/dL — ABNORMAL LOW (ref 8.9–10.3)
Chloride: 106 mmol/L (ref 98–111)
Creatinine, Ser: 0.83 mg/dL (ref 0.44–1.00)
GFR calc Af Amer: >60 mL/min (ref >60)
GFR calc non Af Amer: >60 mL/min (ref >60)
Glucose, Bld: 125 mg/dL — ABNORMAL HIGH (ref 70–99)
Potassium: 3.3 mmol/L — ABNORMAL LOW (ref 3.5–5.1)
Sodium: 132 mmol/L — ABNORMAL LOW (ref 135–145)

## 2019-06-16 LAB — GLUCOSE, CAPILLARY
Glucose-Capillary: 137 mg/dL — ABNORMAL HIGH (ref 70–99)
Glucose-Capillary: 119 mg/dL — ABNORMAL HIGH (ref 70–99)
Glucose-Capillary: 137 mg/dL — ABNORMAL HIGH (ref 70–99)
Glucose-Capillary: 200 mg/dL — ABNORMAL HIGH (ref 70–99)

## 2019-06-16 LAB — CBC WITH DIFFERENTIAL/PLATELET
Abs Immature Granulocytes: 0.15 10*3/uL — ABNORMAL HIGH (ref 0.00–0.07)
Basophils Absolute: 0.1 10*3/uL (ref 0.0–0.1)
Basophils Relative: 1 %
Eosinophils Absolute: 0.1 10*3/uL (ref 0.0–0.5)
Eosinophils Relative: 1 %
HCT: 29.8 % — ABNORMAL LOW (ref 36.0–46.0)
Hemoglobin: 10 g/dL — ABNORMAL LOW (ref 12.0–15.0)
Immature Granulocytes: 2 %
Lymphocytes Relative: 8 %
Lymphs Abs: 0.8 10*3/uL (ref 0.7–4.0)
MCH: 27.7 pg (ref 26.0–34.0)
MCHC: 33.6 g/dL (ref 30.0–36.0)
MCV: 82.5 fL (ref 80.0–100.0)
Monocytes Absolute: 0.8 10*3/uL (ref 0.1–1.0)
Monocytes Relative: 8 %
Neutro Abs: 8 10*3/uL — ABNORMAL HIGH (ref 1.7–7.7)
Neutrophils Relative %: 80 %
Platelets: 103 10*3/uL — ABNORMAL LOW (ref 150–400)
RBC: 3.61 MIL/uL — ABNORMAL LOW (ref 3.87–5.11)
RDW: 15.3 % (ref 11.5–15.5)
WBC: 9.9 10*3/uL (ref 4.0–10.5)
nRBC: 0 % (ref 0.0–0.2)

## 2019-06-16 LAB — PHOSPHORUS: Phosphorus: 2.4 mg/dL — ABNORMAL LOW (ref 2.5–4.6)

## 2019-06-16 LAB — MAGNESIUM: Magnesium: 1.5 mg/dL — ABNORMAL LOW (ref 1.7–2.4)

## 2019-06-16 MED ORDER — CIPROFLOXACIN HCL 500 MG PO TABS: 500.0000 mg | ORAL_TABLET | Freq: Two times a day (BID) | ORAL | 0 refills | Status: AC

## 2019-06-16 MED ORDER — MAGNESIUM SULFATE 4 GM/100ML IV SOLN
4.0000 g | Freq: Once | INTRAVENOUS | Status: AC
  Administered 2019-06-16: 4 g via INTRAVENOUS
  Filled 2019-06-16: qty 100

## 2019-06-16 MED ORDER — POTASSIUM PHOSPHATES 15 MMOLE/5ML IV SOLN
15.0000 mmol | Freq: Once | INTRAVENOUS | Status: AC
  Administered 2019-06-16: 15 mmol via INTRAVENOUS
  Filled 2019-06-16: qty 5

## 2019-06-16 NOTE — Discharge Summary (Signed)
Physician Discharge Summary  Kikue Gerhart HCW:[id number] DOB: [birth year] DOA: 06/11/2019  PCP: Cathie Hoops, PA  Admit date: 06/11/2019 Discharge date: 06/16/2019  Admitted From: Home Disposition: Home   Recommendations for Outpatient Follow-up:  1. Follow up with PCP in 1-2 weeks 2. Please obtain BMP/CBC in one week 3. Please follow up on the following pending results:None  Home Health:No Equipment/Devices:None Discharge Condition: Stable CODE STATUS: Full Diet recommendation: Heart Healthy / Carb Modified   Brief/Interim Summary: Katelyn Barnes is a 39 y.o. female with medical history significant for insulin-dependent diabetes mellitus, obesity, history of DVT following back surgery who presents to the emergency room for evaluation of left lower quadrant pain.  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  Patient was seen by urology who went in and stented her ureter.  Patient became hypotensive and was transferred to ICU with septic shock initially requiring pressors.  Shock resolved.  Patient was maintaining her blood pressure without any intervention.  She was also initially requiring BiPAP, she was weaned off from oxygen and saturating well on room air on discharge. Blood and urine cultures with E. coli.  Initially treated with cefepime and started on ciprofloxacin to complete a 2-week course. She was discharged home with urethral catheter placed by urology and will follow up with them as an outpatient for further management.   Also had AKI and DKA, which were resolved on discharge.  She will continue her home medications and will follow up with her PCP.  Discharge Diagnoses:  Principal Problem:   Sepsis (HCC) Active Problems:   Acute renal failure (ARF) (HCC)   Pyelonephritis   DKA (diabetic ketoacidoses) (HCC)   Obesity hypoventilation syndrome (HCC)    Severe sepsis with acute organ dysfunction (HCC)   Acute respiratory failure (HCC)   Hyperglycemia  Discharge Instructions  Discharge Instructions    Diet - low sodium heart healthy   Complete by: As directed    Discharge instructions   Complete by: As directed    It was pleasure taking care of you. You are being given antibiotics for 10 more days to complete a 2-week course.  Please take it as directed. Please follow-up with urology for further management of your stent and urethral catheter. You are on multiple insulin regimens, please continue with your current one and follow-up with your primary care physician for further recommendations.   Increase activity slowly   Complete by: As directed      Allergies as of 06/16/2019      Reactions   Penicillins Rash         Medication List    STOP taking these medications   insulin aspart protamine- aspart (70-30) 100 UNIT/ML injection Commonly known as: NOVOLOG MIX 70/30   insulin glargine 100 UNIT/ML injection Commonly known as: LANTUS   Victoza 18 MG/3ML Sopn Generic drug: liraglutide     TAKE these medications   atorvastatin 40 MG tablet Commonly known as: LIPITOR Take 40 mg by mouth every evening.   ciprofloxacin 500 MG tablet Commonly known as: CIPRO Take 1 tablet (500 mg total) by mouth 2 (two) times daily for 10 days.   gabapentin 300 MG capsule Commonly known as: NEURONTIN Take 1,200 mg by mouth at bedtime.   metFORMIN 1000 MG tablet Commonly known as: GLUCOPHAGE Take 1,000 mg by mouth 2 (two) times daily with a meal.   Soliqua 100-33 UNT-MCG/ML Sopn Generic drug: Insulin Glargine-Lixisenatide  Inject 45 Units into the skin daily at 12 noon.   topiramate 100 MG tablet Commonly known as: TOPAMAX Take 100 mg by mouth 2 (two) times daily.   valACYclovir 1000 MG tablet Commonly known as: Valtrex Take 1 tablet (1,000 mg total) by mouth 2 (two) times daily.      Follow-up Information    Call Jerilee Field, MD.   Specialty: Urology Contact information: [address] [place] [address] [phone]        Cathie Hoops, [place]. Schedule an appointment as soon as possible for a visit.   Specialty: Family Medicine Contact information: [address] [place] [address] [phone]          Allergies  Allergen Reactions  . Penicillins Rash         Consultations:  PCCM  Urology  Procedures/Studies: CT ABDOMEN PELVIS WO CONTRAST  Result Date: 06/11/2019 CLINICAL DATA:  39 year old female acute LEFT abdominal and pelvic pain. EXAM: CT ABDOMEN AND PELVIS WITHOUT CONTRAST TECHNIQUE: Multidetector CT imaging of the abdomen and pelvis was performed following the standard protocol without IV contrast. COMPARISON:  05/27/2013 CT FINDINGS: Please note that parenchymal abnormalities may be missed without intravenous contrast. Lower chest: No acute abnormality. Hepatobiliary: The liver and gallbladder are unremarkable. No biliary dilatation. Pancreas: Unremarkable Spleen: Unremarkable Adrenals/Urinary Tract: The LEFT kidney is diffusely enlarged and there is moderate perinephric inflammation. A small amount of gas along the SUPERIOR aspect of the LEFT kidney is noted, highly suspicious for infection/emphysematous pyelonephritis. There is moderate LEFT hydroureteronephrosis extending to near the bladder within equivocal slight transition change in the distal LEFT ureter 1 cm above the UVJ. There is no evidence of urinary calculi. Renal vasculature is not well evaluated without intravenous contrast. The RIGHT kidney, adrenal glands and bladder are unremarkable. Stomach/Bowel: Stomach is within normal limits. Appendix appears normal. No evidence of bowel wall thickening, distention, or inflammatory changes. Vascular/Lymphatic: Aortic atherosclerosis. Mildly enlarged LEFT perinephric lymph nodes are likely reactive. Reproductive: Uterus and bilateral  adnexa are unremarkable except for tubal ligation clips. Other: A small to moderate amount of free fluid within the abdomen and pelvis noted. No evidence of pneumoperitoneum. Musculoskeletal: No acute or suspicious bony abnormalities. Degenerative changes in the lumbar spine again noted. IMPRESSION: 1. 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 Moderate LEFT hydroureteronephrosis extending to near the bladder with equivocal slight transition change in the distal LEFT ureter 1 cm above the UVJ. No evidence of obstructing cause/urinary calculi. This hydroureteronephrosis may be caused by infection or possibly an obstructing process not visualized or passed calculus. 2. Small to moderate amount of free fluid within the abdomen and pelvis. 3. Aortic Atherosclerosis (ICD10-I70.0. Critical Value/emergent results were called by telephone at the time of interpretation on 06/11/2019 at 11:20 am to provider [facility] , who verbally acknowledged these results. Electronically Signed   By: Harmon Pier M.D.   On: 06/11/2019 11:28   US RENAL  Result Date: 06/13/2019 CLINICAL DATA:  Acute renal failure EXAM: RENAL / URINARY TRACT ULTRASOUND COMPLETE COMPARISON:  Abdominal CT 06/11/2019 FINDINGS: Right Kidney: Renal measurements: 15 x 6 x 7 cm = volume: 304 mL . Echogenicity within normal limits. No mass or hydronephrosis visualized. Left Kidney: Renal measurements: 15.5 x 8 x 7 cm = volume: 434 mL. Extensive shadowing echogenic appearance at the upper to  interpolar region, greater than seen on preceding CT. It is somewhat difficult to reassess hydronephrosis given the degree of gas. No hydronephrosis is seen. Bladder: Decompressed by Foley catheter. IMPRESSION: 1. Extensive left renal cortical gas which has progressed when compared to preceding CT, again most likely emphysematous  pyelonephritis. Hydronephrosis is no longer seen. 2. Bilateral nephromegaly which may reflect diabetic nephropathy with asymmetric greater left enlargement due to the pyelonephritis. Electronically Signed   By: Monte Fantasia M.D.   On: 06/13/2019 10:55   DG Chest Port 1 View  Result Date: 06/14/2019 CLINICAL DATA:  Respiratory failure. EXAM: PORTABLE CHEST 1 VIEW COMPARISON:  06/12/2019. FINDINGS: Mediastinum and hilar structures normal. Low lung volumes with bibasilar atelectasis. No pleural effusion or pneumothorax. Cardiomegaly. No pulmonary venous congestion. Prior cervicothoracic spine fusion. IMPRESSION: 1.  Low lung volumes with mild bibasilar atelectasis. 2.  Cardiomegaly.  No pulmonary venous congestion. Electronically Signed   By: Marcello Moores  Register   On: 06/14/2019 07:48   DG Chest Port 1 View  Result Date: 06/12/2019 CLINICAL DATA:  Acute respiratory failure EXAM: PORTABLE CHEST 1 VIEW COMPARISON:  04/02/2016 FINDINGS: Cardiac shadow is stable. The lungs are hypoinflated with mild left basilar atelectasis. Postsurgical changes are noted in the lower cervical spine. No acute bony abnormality is seen. IMPRESSION: Mild left basilar atelectasis. Poor inspiratory effort. Electronically Signed   By: Inez Catalina M.D.   On: 06/12/2019 20:17   DG OR UROLOGY CYSTO IMAGE ([facility] ONLY)  Result Date: 06/11/2019 There is no interpretation for this exam.  This order is for images obtained during a surgical procedure.  Please See "Surgeries" Tab for more information regarding the procedure.     Subjective: Patient was feeling better when seen today.  No new complaints.  Husband was in the room.  Discharge Exam: Vitals:   06/16/19 1200 06/16/19 1400  BP: (!) 98/55 (!) 97/56  Pulse:    Resp:    Temp: 98.1 F (36.7 C)   SpO2:     Vitals:   06/16/19 1000 06/16/19 1100 06/16/19 1200 06/16/19 1400  BP: 115/69 124/73 (!) 98/55 (!) 97/56  Pulse: 97 99    Resp: (!) 25 (!) 21    Temp:   98.1 F  (36.7 C)   TempSrc:      SpO2: 94% 92%    Weight:      Height:        General: Pt is alert, awake, not in acute distress Cardiovascular: RRR, S1/S2 +, no rubs, no gallops Respiratory: CTA bilaterally, no wheezing, no rhonchi Abdominal: Soft, NT, ND, bowel sounds + Extremities: no edema, no cyanosis   The results of significant diagnostics from this hospitalization (including imaging, microbiology, ancillary and laboratory) are listed below for reference.    Microbiology: Recent Results (from the past 240 hour(s))  SARS Coronavirus 2 by RT PCR (hospital order, performed in [facility] hospital lab) Nasopharyngeal Nasopharyngeal Swab     Status: None   Collection Time: 06/11/19  8:59 AM   Specimen: Nasopharyngeal Swab  Result Value Ref Range Status   SARS Coronavirus 2 NEGATIVE NEGATIVE Final    Comment: (NOTE) SARS-CoV-2 target nucleic acids are NOT DETECTED. The SARS-CoV-2 RNA is generally detectable in upper and lower respiratory specimens during the acute phase of infection. The lowest concentration of SARS-CoV-2 viral copies this assay can detect is 250 copies / mL. A negative result does not preclude SARS-CoV-2 infection and should not be used as the sole basis for treatment or other  patient management decisions.  A negative result may occur with improper specimen collection / handling, submission of specimen other than nasopharyngeal swab, presence of viral mutation(s) within the areas targeted by this assay, and inadequate number of viral copies (<250 copies / mL). A negative result must be combined with clinical observations, patient history, and epidemiological information. Fact Sheet for Patients:   BoilerBrush.com.cy Fact Sheet for Healthcare Providers: https://pope.com/ This test is not yet approved or cleared  by the [place] FDA and has been authorized for detection and/or diagnosis of SARS-CoV-2 by FDA under  an Emergency Use Authorization (EUA).  This EUA will remain in effect (meaning this test can be used) for the duration of the COVID-19 declaration under Section 564(b)(1) of the Act, 21 U.S.C. section 360bbb-3(b)(1), unless the authorization is terminated or revoked sooner. Performed at [facility], [address] [place], [place] [address]   Urine culture     Status: Abnormal   Collection Time: 06/11/19 11:38 AM   Specimen: In/Out Cath Urine  Result Value Ref Range Status   Specimen Description   Final    IN/OUT CATH URINE Performed at [facility], [address] [place], [place] [address]    Special Requests   Final    NONE Performed at [facility], [address] [place], [place] [address]    Culture >=100,000 COLONIES/mL ESCHERICHIA COLI (A)  Final   Report Status 06/13/2019 FINAL  Final   Organism ID, Bacteria ESCHERICHIA COLI (A)  Final      Susceptibility   Escherichia coli - MIC*    AMPICILLIN >=32 RESISTANT Resistant     CEFAZOLIN 8 SENSITIVE Sensitive     CEFTRIAXONE <=1 SENSITIVE Sensitive     CIPROFLOXACIN <=0.25 SENSITIVE Sensitive     GENTAMICIN <=1 SENSITIVE Sensitive     IMIPENEM <=0.25 SENSITIVE Sensitive     NITROFURANTOIN <=16 SENSITIVE Sensitive     TRIMETH/SULFA >=320 RESISTANT Resistant     AMPICILLIN/SULBACTAM >=32 RESISTANT Resistant     PIP/TAZO 64 INTERMEDIATE Intermediate     * >=100,000 COLONIES/mL ESCHERICHIA COLI  Blood Culture (routine x 2)     Status: Abnormal   Collection Time: 06/11/19 11:38 AM   Specimen: BLOOD  Result Value Ref Range Status   Specimen Description   Final    BLOOD BLOOD LEFT WRIST Performed at [facility], [address], [place], [place] [address]    Special Requests   Final    BOTTLES DRAWN AEROBIC AND ANAEROBIC Blood Culture results may not be optimal due to an inadequate volume of blood received in culture bottles Performed at [facility], [address] [place], [place] [address]    Culture  Setup Time   Final    GRAM NEGATIVE RODS IN BOTH AEROBIC AND ANAEROBIC BOTTLES CRITICAL RESULT CALLED TO, READ BACK BY AND VERIFIED WITHBeola Cord  1040 06/11/2019 RH Performed at [facility], [address], [place], [place] [address]    Culture ESCHERICHIA COLI (A)  Final   Report Status 06/14/2019 FINAL  Final   Organism ID, Bacteria ESCHERICHIA COLI  Final      Susceptibility   Escherichia coli - MIC*    AMPICILLIN >=32 RESISTANT Resistant     CEFAZOLIN >=64 RESISTANT Resistant     CEFEPIME <=1 SENSITIVE Sensitive     CEFTAZIDIME <=1 SENSITIVE Sensitive     CEFTRIAXONE <=1 SENSITIVE Sensitive     CIPROFLOXACIN <=0.25 SENSITIVE Sensitive  GENTAMICIN <=1 SENSITIVE Sensitive     IMIPENEM <=0.25 SENSITIVE Sensitive     TRIMETH/SULFA >=320 RESISTANT Resistant     AMPICILLIN/SULBACTAM >=32 RESISTANT Resistant     PIP/TAZO >=128 RESISTANT Resistant     * ESCHERICHIA COLI  Blood Culture ID Panel (Reflexed)     Status: Abnormal   Collection Time: 06/11/19 11:38 AM  Result Value Ref Range Status   Enterococcus species NOT DETECTED NOT DETECTED Final   Listeria monocytogenes NOT DETECTED NOT DETECTED Final   Staphylococcus species NOT DETECTED NOT DETECTED Final   Staphylococcus aureus (BCID) NOT DETECTED NOT DETECTED Final   Streptococcus species NOT DETECTED NOT DETECTED Final   Streptococcus agalactiae NOT DETECTED NOT DETECTED Final   Streptococcus pneumoniae NOT DETECTED NOT DETECTED Final   Streptococcus pyogenes NOT DETECTED NOT DETECTED Final   Acinetobacter baumannii NOT DETECTED NOT DETECTED Final   Enterobacteriaceae species DETECTED (A) NOT DETECTED Final    Comment: Enterobacteriaceae represent a large family of gram-negative bacteria, not a single organism. CRITICAL RESULT CALLED TO, READ BACK BY AND VERIFIED WITH: JASON ROBBINS @ 1040 06/11/2019 RH    Enterobacter cloacae complex NOT DETECTED NOT DETECTED Final    Escherichia coli DETECTED (A) NOT DETECTED Final    Comment: CRITICAL RESULT CALLED TO, READ BACK BY AND VERIFIED WITH: JASON ROBBINS @ 1044 06/11/2019 RH    Klebsiella oxytoca NOT DETECTED NOT DETECTED Final   Klebsiella pneumoniae NOT DETECTED NOT DETECTED Final   Proteus species NOT DETECTED NOT DETECTED Final   Serratia marcescens NOT DETECTED NOT DETECTED Final   Carbapenem resistance NOT DETECTED NOT DETECTED Final   Haemophilus influenzae NOT DETECTED NOT DETECTED Final   Neisseria meningitidis NOT DETECTED NOT DETECTED Final   Pseudomonas aeruginosa NOT DETECTED NOT DETECTED Final   Candida albicans NOT DETECTED NOT DETECTED Final   Candida glabrata NOT DETECTED NOT DETECTED Final   Candida krusei NOT DETECTED NOT DETECTED Final   Candida parapsilosis NOT DETECTED NOT DETECTED Final   Candida tropicalis NOT DETECTED NOT DETECTED Final    Comment: Performed at [facility], [address] [place], [place] [address]     Labs: BNP (last 3 results) No results for input(s): BNP in the last 8760 hours. Basic Metabolic Panel: Recent Labs  Lab 06/12/19 0529 06/13/19 0023 06/13/19 1043 06/14/19 0427 06/15/19 0648 06/16/19 0533  NA 132* 133*  --  129* 131* 132*  K 3.8 3.5  --  3.1* 3.0* 3.3*  CL 103 107  --  103 104 106  CO2 19* 18*  --  18* 19* 21*  GLUCOSE 226* 228*  --  181* 146* 125*  BUN 33* 34*  --  29* 23* 18  CREATININE 2.06* 1.73*  --  1.21* 1.11* 0.83  CALCIUM 7.1* 6.9*  --  7.3* 7.3* 7.4*  MG  --  1.1* 2.4  --  1.8 1.5*  PHOS  --  2.5  --   --   --  2.4*   Liver Function Tests: Recent Labs  Lab 06/11/19 0852 06/13/19 0023  AST 39 36  ALT 27 36  ALKPHOS 69 90  BILITOT 2.8* 2.7*  PROT 7.1 5.9*  ALBUMIN 3.5 2.5*   Recent Labs  Lab 06/11/19 0852  LIPASE 16   No results for input(s): AMMONIA in the last 168 hours. CBC: Recent Labs  Lab 06/12/19 0529 06/13/19 0023 06/14/19 0427 06/15/19 0648 06/16/19 0533  WBC 6.0 6.5 7.3 7.4 9.9   NEUTROABS  --   --   --  5.6 8.0*  HGB 10.9* 11.3* 10.7* 9.5* 10.0*  HCT 31.6* 32.8* 31.8* 30.1* 29.8*  MCV 81.7 81.6 82.8 86.5 82.5  PLT 55* 63* 86* 61* 103*   Cardiac Enzymes: No results for input(s): CKTOTAL, CKMB, CKMBINDEX, TROPONINI in the last 168 hours. BNP: Invalid input(s): POCBNP CBG: Recent Labs  Lab 06/15/19 2034 06/16/19 0100 06/16/19 0541 06/16/19 0751 06/16/19 1201  GLUCAP 182* 137* 119* 137* 200*   D-Dimer No results for input(s): DDIMER in the last 72 hours. Hgb A1c No results for input(s): HGBA1C in the last 72 hours. Lipid Profile No results for input(s): CHOL, HDL, LDLCALC, TRIG, CHOLHDL, LDLDIRECT in the last 72 hours. Thyroid function studies No results for input(s): TSH, T4TOTAL, T3FREE, THYROIDAB in the last 72 hours.  Invalid input(s): FREET3 Anemia work up No results for input(s): VITAMINB12, FOLATE, FERRITIN, TIBC, IRON, RETICCTPCT in the last 72 hours. Urinalysis    Component Value Date/Time   COLORURINE YELLOW (A) 06/11/2019 0859   APPEARANCEUR CLOUDY (A) 06/11/2019 0859   APPEARANCEUR Clear 05/27/2013 1839   LABSPEC 1.015 06/11/2019 0859   LABSPEC 1.037 05/27/2013 1839   PHURINE 5.0 06/11/2019 0859   GLUCOSEU >=500 (A) 06/11/2019 0859   GLUCOSEU >=500 05/27/2013 1839   HGBUR MODERATE (A) 06/11/2019 0859   BILIRUBINUR NEGATIVE 06/11/2019 0859   BILIRUBINUR Negative 05/27/2013 1839   KETONESUR 5 (A) 06/11/2019 0859   PROTEINUR 100 (A) 06/11/2019 0859   NITRITE NEGATIVE 06/11/2019 0859   LEUKOCYTESUR TRACE (A) 06/11/2019 0859   LEUKOCYTESUR Negative 05/27/2013 1839   Sepsis Labs Invalid input(s): PROCALCITONIN,  WBC,  LACTICIDVEN Microbiology Recent Results (from the past 240 hour(s))  SARS Coronavirus 2 by RT PCR (hospital order, performed in [facility] hospital lab) Nasopharyngeal Nasopharyngeal Swab     Status: None   Collection Time: 06/11/19  8:59 AM   Specimen: Nasopharyngeal Swab  Result Value Ref Range Status   SARS  Coronavirus 2 NEGATIVE NEGATIVE Final    Comment: (NOTE) SARS-CoV-2 target nucleic acids are NOT DETECTED. The SARS-CoV-2 RNA is generally detectable in upper and lower respiratory specimens during the acute phase of infection. The lowest concentration of SARS-CoV-2 viral copies this assay can detect is 250 copies / mL. A negative result does not preclude SARS-CoV-2 infection and should not be used as the sole basis for treatment or other patient management decisions.  A negative result may occur with improper specimen collection / handling, submission of specimen other than nasopharyngeal swab, presence of viral mutation(s) within the areas targeted by this assay, and inadequate number of viral copies (<250 copies / mL). A negative result must be combined with clinical observations, patient history, and epidemiological information. Fact Sheet for Patients:   BoilerBrush.com.cyhttps://www.fda.gov/media/136312/download Fact Sheet for Healthcare Providers: https://pope.com/https://www.fda.gov/media/136313/download This test is not yet approved or cleared  by the [place] FDA and has been authorized for detection and/or diagnosis of SARS-CoV-2 by FDA under an Emergency Use Authorization (EUA).  This EUA will remain in effect (meaning this test can be used) for the duration of the COVID-19 declaration under Section 564(b)(1) of the Act, 21 U.S.C. section 360bbb-3(b)(1), unless the authorization is terminated or revoked sooner. Performed at [facility], [address] [place], [place] [address]   Urine culture     Status: Abnormal   Collection Time: 06/11/19 11:38 AM   Specimen: In/Out Cath Urine  Result Value Ref Range Status   Specimen Description   Final    IN/OUT CATH URINE Performed at [facility],  [address], [place], [place] [address]    Special Requests   Final    NONE Performed at [facility], [address] [place], [place] [address]    Culture >=100,000  COLONIES/mL ESCHERICHIA COLI (A)  Final   Report Status 06/13/2019 FINAL  Final   Organism ID, Bacteria ESCHERICHIA COLI (A)  Final      Susceptibility   Escherichia coli - MIC*    AMPICILLIN >=32 RESISTANT Resistant     CEFAZOLIN 8 SENSITIVE Sensitive     CEFTRIAXONE <=1 SENSITIVE Sensitive     CIPROFLOXACIN <=0.25 SENSITIVE Sensitive     GENTAMICIN <=1 SENSITIVE Sensitive     IMIPENEM <=0.25 SENSITIVE Sensitive     NITROFURANTOIN <=16 SENSITIVE Sensitive     TRIMETH/SULFA >=320 RESISTANT Resistant     AMPICILLIN/SULBACTAM >=32 RESISTANT Resistant     PIP/TAZO 64 INTERMEDIATE Intermediate     * >=100,000 COLONIES/mL ESCHERICHIA COLI  Blood Culture (routine x 2)     Status: Abnormal   Collection Time: 06/11/19 11:38 AM   Specimen: BLOOD  Result Value Ref Range Status   Specimen Description   Final    BLOOD BLOOD LEFT WRIST Performed at [facility], [address], [place], [place] [address]    Special Requests   Final    BOTTLES DRAWN AEROBIC AND ANAEROBIC Blood Culture results may not be optimal due to an inadequate volume of blood received in culture bottles Performed at [facility], [address] [place], [place] [address]    Culture  Setup Time   Final    GRAM NEGATIVE RODS IN BOTH AEROBIC AND ANAEROBIC BOTTLES CRITICAL RESULT CALLED TO, READ BACK BY AND VERIFIED WITHBeola Cord  1040 06/11/2019 RH Performed at [facility], [address], [place], [place] [address]    Culture ESCHERICHIA COLI (A)  Final   Report Status 06/14/2019 FINAL  Final   Organism ID, Bacteria ESCHERICHIA COLI  Final      Susceptibility   Escherichia coli - MIC*    AMPICILLIN >=32 RESISTANT Resistant     CEFAZOLIN >=64 RESISTANT Resistant     CEFEPIME <=1 SENSITIVE Sensitive     CEFTAZIDIME <=1 SENSITIVE Sensitive     CEFTRIAXONE <=1 SENSITIVE Sensitive     CIPROFLOXACIN <=0.25 SENSITIVE Sensitive     GENTAMICIN <=1 SENSITIVE Sensitive     IMIPENEM <=0.25  SENSITIVE Sensitive     TRIMETH/SULFA >=320 RESISTANT Resistant     AMPICILLIN/SULBACTAM >=32 RESISTANT Resistant     PIP/TAZO >=128 RESISTANT Resistant     * ESCHERICHIA COLI  Blood Culture ID Panel (Reflexed)     Status: Abnormal   Collection Time: 06/11/19 11:38 AM  Result Value Ref Range Status   Enterococcus species NOT DETECTED NOT DETECTED Final   Listeria monocytogenes NOT DETECTED NOT DETECTED Final   Staphylococcus species NOT DETECTED NOT DETECTED Final   Staphylococcus aureus (BCID) NOT DETECTED NOT DETECTED Final   Streptococcus species NOT DETECTED NOT DETECTED Final   Streptococcus agalactiae NOT DETECTED NOT DETECTED Final   Streptococcus pneumoniae NOT DETECTED NOT DETECTED Final   Streptococcus pyogenes NOT DETECTED NOT DETECTED Final   Acinetobacter baumannii NOT DETECTED NOT DETECTED Final   Enterobacteriaceae species DETECTED (A) NOT DETECTED Final    Comment: Enterobacteriaceae represent a large family of gram-negative bacteria, not a single organism. CRITICAL RESULT CALLED TO, READ BACK BY AND VERIFIED WITH: JASON ROBBINS @ 1040 06/11/2019 RH    Enterobacter cloacae complex  NOT DETECTED NOT DETECTED Final   Escherichia coli DETECTED (A) NOT DETECTED Final    Comment: CRITICAL RESULT CALLED TO, READ BACK BY AND VERIFIED WITH: JASON ROBBINS @ 1044 06/11/2019 RH    Klebsiella oxytoca NOT DETECTED NOT DETECTED Final   Klebsiella pneumoniae NOT DETECTED NOT DETECTED Final   Proteus species NOT DETECTED NOT DETECTED Final   Serratia marcescens NOT DETECTED NOT DETECTED Final   Carbapenem resistance NOT DETECTED NOT DETECTED Final   Haemophilus influenzae NOT DETECTED NOT DETECTED Final   Neisseria meningitidis NOT DETECTED NOT DETECTED Final   Pseudomonas aeruginosa NOT DETECTED NOT DETECTED Final   Candida albicans NOT DETECTED NOT DETECTED Final   Candida glabrata NOT DETECTED NOT DETECTED Final   Candida krusei NOT DETECTED NOT DETECTED Final   Candida  parapsilosis NOT DETECTED NOT DETECTED Final   Candida tropicalis NOT DETECTED NOT DETECTED Final    Comment: Performed at [facility], [address], [place], [place] [address]    Time coordinating discharge: Over 30 minutes  SIGNED:  Arnetha Courser, MD  [facility] 06/16/2019, 3:48 PM  If 7PM-7AM, please contact night-coverage [email or web address]  This record has been created using Conservation officer, historic buildings. Errors have been sought and corrected,but may not always be located. Such creation errors do not reflect on the standard of care.

## 2019-06-18 ENCOUNTER — Other Ambulatory Visit: Payer: Self-pay | Admitting: Physician Assistant

## 2019-06-18 ENCOUNTER — Telehealth: Payer: Self-pay | Admitting: *Deleted

## 2019-06-18 MED ORDER — OXYBUTYNIN CHLORIDE 5 MG PO TABS
5.0000 mg | ORAL_TABLET | Freq: Two times a day (BID) | ORAL | 0 refills | Status: DC
Start: 2019-06-18 — End: 2019-06-18

## 2019-06-18 MED ORDER — OXYBUTYNIN CHLORIDE 5 MG PO TABS
5.0000 mg | ORAL_TABLET | Freq: Three times a day (TID) | ORAL | 0 refills | Status: DC | PRN
Start: 2019-06-18 — End: 2019-09-05

## 2019-06-18 MED ORDER — OXYBUTYNIN CHLORIDE 5 MG PO TABS: 5.0000 mg | ORAL_TABLET | Freq: Three times a day (TID) | ORAL | 0 refills | Status: DC | PRN

## 2019-06-18 NOTE — Telephone Encounter (Signed)
Talk to Katelyn Barnes about the telephone that was printed out for [place]. Patient to start on oxybutynin  5 mg 3 times Daily.

## 2019-06-18 NOTE — Addendum Note (Signed)
Addended by: Levada Schilling on: 06/18/2019 02:07 PM   Modules accepted: Orders

## 2019-06-29 ENCOUNTER — Other Ambulatory Visit: Payer: Self-pay | Admitting: Radiology

## 2019-06-29 ENCOUNTER — Other Ambulatory Visit: Payer: Self-pay

## 2019-06-29 ENCOUNTER — Ambulatory Visit (INDEPENDENT_AMBULATORY_CARE_PROVIDER_SITE_OTHER): Payer: Self-pay | Admitting: Urology

## 2019-06-29 ENCOUNTER — Encounter: Payer: Self-pay | Admitting: Urology

## 2019-06-29 VITALS — BP 123/83 | HR 101 | Ht 66.0 in | Wt 235.0 lb

## 2019-06-29 DIAGNOSIS — N135 Crossing vessel and stricture of ureter without hydronephrosis: Secondary | ICD-10-CM

## 2019-06-29 MED ORDER — NITROFURANTOIN MONOHYD MACRO 100 MG PO CAPS: 100.0000 mg | ORAL_CAPSULE | Freq: Every day | ORAL | 0 refills | Status: DC

## 2019-06-29 NOTE — Patient Instructions (Signed)
Ureteroscopy Ureteroscopy is a procedure to check for and treat problems inside part of the urinary tract. In this procedure, a thin, tube-shaped instrument with a light at the end (ureteroscope) is used to look at the inside of the kidneys and the ureters, which are the tubes that carry urine from the kidneys to the bladder. The ureteroscope is inserted into one or both of the ureters. You may need this procedure if you have frequent urinary tract infections (UTIs), blood in your urine, or a stone in one of your ureters. A ureteroscopy can be done to find the cause of urine blockage in a ureter and to evaluate other abnormalities inside the ureters or kidneys. If stones are found, they can be removed during the procedure. Polyps, abnormal tissue, and some types of tumors can also be removed or treated. The ureteroscope may also have a tool to remove tissue to be checked for disease under a microscope (biopsy). Tell a health care provider about:  Any allergies you have.  All medicines you are taking, including vitamins, herbs, eye drops, creams, and over-the-counter medicines.  Any problems you or family members have had with anesthetic medicines.  Any blood disorders you have.  Any surgeries you have had.  Any medical conditions you have.  Whether you are pregnant or may be pregnant. What are the risks? Generally, this is a safe procedure. However, problems may occur, including:  Bleeding.  Infection.  Allergic reactions to medicines.  Scarring that narrows the ureter (stricture).  Creating a hole in the ureter (perforation). What happens before the procedure? Staying hydrated Follow instructions from your health care provider about hydration, which may include:  Up to 2 hours before the procedure - you may continue to drink clear liquids, such as water, clear fruit juice, black coffee, and plain tea. Eating and drinking restrictions Follow instructions from your health care  provider about eating and drinking, which may include:  8 hours before the procedure - stop eating heavy meals or foods such as meat, fried foods, or fatty foods.  6 hours before the procedure - stop eating light meals or foods, such as toast or cereal.  6 hours before the procedure - stop drinking milk or drinks that contain milk.  2 hours before the procedure - stop drinking clear liquids. Medicines  Ask your health care provider about: ? Changing or stopping your regular medicines. This is especially important if you are taking diabetes medicines or blood thinners. ? Taking medicines such as aspirin and ibuprofen. These medicines can thin your blood. Do not take these medicines before your procedure if your health care provider instructs you not to.  You may be given antibiotic medicine to help prevent infection. General instructions  You may have a urine sample taken to check for infection.  Plan to have someone take you home from the hospital or clinic. What happens during the procedure?   To reduce your risk of infection: ? Your health care team will wash or sanitize their hands. ? Your skin will be washed with soap.  An IV tube will be inserted into one of your veins.  You will be given one of the following: ? A medicine to help you relax (sedative). ? A medicine to make you fall asleep (general anesthetic). ? A medicine that is injected into your spine to numb the area below and slightly above the injection site (spinal anesthetic).  To lower your risk of infection, you may be given an antibiotic medicine   by an injection or through the IV tube.  The opening from which you urinate (urethra) will be cleaned with a germ-killing solution.  The ureteroscope will be passed through your urethra into your bladder.  A salt-water solution will flow through the ureteroscope to fill your bladder. This will help the health care provider see the openings of your ureters more  clearly.  Then, the ureteroscope will be passed into your ureter. ? If a growth is found, a piece of it may be removed so it can be examined under a microscope (biopsy). ? If a stone is found, it may be removed through the ureteroscope, or the stone may be broken up using a laser, shock waves, or electrical energy. ? In some cases, if the ureter is too small, a tube may be inserted that keeps the ureter open (ureteral stent). The stent may be left in place for 1 or 2 weeks to keep the ureter open, and then the ureteroscopy procedure will be performed.  The scope will be removed, and your bladder will be emptied. The procedure may vary among health care providers and hospitals. What happens after the procedure?  Your blood pressure, heart rate, breathing rate, and blood oxygen level will be monitored until the medicines you were given have worn off.  You may be asked to urinate.  Donot drive for 24 hours if you were given a sedative. This information is not intended to replace advice given to you by your health care provider. Make sure you discuss any questions you have with your health care provider. Document Revised: 12/10/2016 Document Reviewed: 10/10/2015 Elsevier Patient Education  2020 Elsevier Inc.  

## 2019-06-29 NOTE — Progress Notes (Signed)
06/29/2019 9:52 AM   Katelyn Barnes [birth year] [id number]  Referring provider: Cathie Hoops, PA [address] [place],  [place] [address]  Chief Complaint  Patient presents with  . Follow-up    2 week follow-up    HPI:  Katelyn Barnes is a 39 year old female who follows up today with a left ureteral stricture.  She was seen 06/11/2019 and underwent an urgent left stent for hydroureteronephrosis down to the left distal ureter in the setting of sepsis and emphysematous pyelitis.  Findings were on CT scan.  At left ureteroscopy there was a transition point in the left distal ureter but the ureter appeared more bunched up possiby from an external source, a passed papillary necrosis or even just from severe inflammation. There was not a specific narrowing or stricture of the ureter, but proximal to this area it was clearly dilated.  A follow-up ultrasound after the stent revealed some increased gas in the parenchyma, but no obvious hydronephrosis.  She ultimately improved and was discharged on Cipro. She completed Cipro three days ago. No fever. Stent with mild LLQ discomfort or "sharp quick pains" and urgency.   Only pelvic surgery BTL done in 2009. Her last GYN exam PCP in 2019. She sees her PCP soon and I reminded pt to get yearly Gyn exam there.   PMH: Past Medical History:  Diagnosis Date  . DDD (degenerative disc disease), lumbar   . Diabetes mellitus without complication (HCC)    Type II  . DVT (deep venous thrombosis) (HCC) 2016   post back surgery - was on Eliquis 3 months    Surgical History: Past Surgical History:  Procedure Laterality Date  . ANTERIOR CERVICAL DECOMP/DISCECTOMY FUSION Right 05/05/2016   Procedure: ANTERIOR CERVICAL DECOMPRESSION FUSION, CERVICAL 7- THORACIC 1 WITH INSTRUMENTATION AND ALLOGRAFT;  Surgeon: Estill Bamberg, MD;  Location: [facility] OR;  Service: Orthopedics;  Laterality: Right;  ANTERIOR CERVICAL DECOMPRESSION FUSION, CERVICAL 7-  THORACIC 1 WITH INSTRUMENTATION AND ALLOGRAFT  . BACK SURGERY  2016   discectomy   . CARPAL TUNNEL RELEASE Bilateral   . CYSTOSCOPY WITH URETEROSCOPY AND STENT PLACEMENT Left 06/11/2019   Procedure: CYSTOSCOPY WITH URETEROSCOPY AND STENT PLACEMENT;  Surgeon: Jerilee Field, MD;  Location: [facility] ORS;  Service: Urology;  Laterality: Left;  . TUBAL LIGATION  08/2007    Home Medications:  Allergies as of 06/29/2019      Reactions   Penicillins Rash         Medication List       Accurate as of June 29, 2019  9:52 AM. If you have any questions, ask your nurse or doctor.        atorvastatin 40 MG tablet Commonly known as: LIPITOR Take 40 mg by mouth every evening.   gabapentin 300 MG capsule Commonly known as: NEURONTIN Take 1,200 mg by mouth at bedtime.   metFORMIN 1000 MG tablet Commonly known as: GLUCOPHAGE Take 1,000 mg by mouth 2 (two) times daily with a meal.   oxybutynin 5 MG tablet Commonly known as: DITROPAN Take 1 tablet (5 mg total) by mouth 3 (three) times daily as needed for bladder spasms.   Soliqua 100-33 UNT-MCG/ML Sopn Generic drug: Insulin Glargine-Lixisenatide Inject 45 Units into the skin daily at 12 noon.   topiramate 100 MG tablet Commonly known as: TOPAMAX Take 100 mg by mouth 2 (two) times daily.   valACYclovir 1000 MG tablet Commonly known as: Valtrex Take 1 tablet (1,000 mg total) by mouth 2 (two)  times daily.       Allergies:  Allergies  Allergen Reactions  . Penicillins Rash         Family History: No family history on file.  Social History:  reports that she has never smoked. She has never used smokeless tobacco. She reports that she does not drink alcohol and does not use drugs.   Physical Exam: LMP 06/05/2019 Comment: neg hgc 06/11/19  Constitutional:  Alert and oriented, No acute distress. HEENT:  AT, moist mucus membranes.  Trachea midline, no masses. Cardiovascular: No clubbing, cyanosis, or edema. Respiratory:  Normal respiratory effort, no increased work of breathing. GI: Abdomen is soft, nontender, nondistended, no abdominal masses GU: No CVA tenderness Skin: No rashes, bruises or suspicious lesions. Neurologic: Grossly intact, no focal deficits, moving all 4 extremities. Psychiatric: Normal mood and affect.  Laboratory Data: Lab Results  Component Value Date   WBC 9.9 06/16/2019   HGB 10.0 (L) 06/16/2019   HCT 29.8 (L) 06/16/2019   MCV 82.5 06/16/2019   PLT 103 (L) 06/16/2019    Lab Results  Component Value Date   CREATININE 0.83 06/16/2019    No results found for: PSA  No results found for: TESTOSTERONE  Lab Results  Component Value Date   HGBA1C 13.2 (H) 06/11/2019    Urinalysis    Component Value Date/Time   COLORURINE YELLOW (A) 06/11/2019 0859   APPEARANCEUR CLOUDY (A) 06/11/2019 0859   APPEARANCEUR Clear 05/27/2013 1839   LABSPEC 1.015 06/11/2019 0859   LABSPEC 1.037 05/27/2013 1839   PHURINE 5.0 06/11/2019 0859   GLUCOSEU >=500 (A) 06/11/2019 0859   GLUCOSEU >=500 05/27/2013 1839   HGBUR MODERATE (A) 06/11/2019 0859   BILIRUBINUR NEGATIVE 06/11/2019 0859   BILIRUBINUR Negative 05/27/2013 1839   KETONESUR 5 (A) 06/11/2019 0859   PROTEINUR 100 (A) 06/11/2019 0859   NITRITE NEGATIVE 06/11/2019 0859   LEUKOCYTESUR TRACE (A) 06/11/2019 0859   LEUKOCYTESUR Negative 05/27/2013 1839    Lab Results  Component Value Date   BACTERIA RARE (A) 06/11/2019    Pertinent Imaging: CT and [place] - 5, 6 / 2021  No results found for this or any previous visit.  No results found for this or any previous visit.  No results found for this or any previous visit.  No results found for this or any previous visit.  Results for orders placed during the hospital encounter of 06/11/19  US RENAL  Narrative CLINICAL DATA:  Acute renal failure  EXAM: RENAL / URINARY TRACT ULTRASOUND COMPLETE  COMPARISON:  Abdominal CT 06/11/2019  FINDINGS: Right Kidney:  Renal  measurements: 15 x 6 x 7 cm = volume: 304 mL . Echogenicity within normal limits. No mass or hydronephrosis visualized.  Left Kidney:  Renal measurements: 15.5 x 8 x 7 cm = volume: 434 mL. Extensive shadowing echogenic appearance at the upper to interpolar region, greater than seen on preceding CT. It is somewhat difficult to reassess hydronephrosis given the degree of gas. No hydronephrosis is seen.  Bladder:  Decompressed by Foley catheter.  IMPRESSION: 1. 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 Hydronephrosis is no longer seen. 2. Bilateral nephromegaly which may reflect diabetic nephropathy with asymmetric greater left enlargement due to the pyelonephritis.   Electronically Signed By: Monte Fantasia M.D. On: 06/13/2019 10:55  No results found for this or any previous visit.  No results found for this or any previous visit.  No results found  for this or any previous visit.   Assessment & Plan:    1.  Left ureteral obstruction- I'm not certain what caused her obstruction - inflammation, papillary necrosis, etc. I discussed with Katelyn Barnes today nature risk and benefits of simply removing the stent and then following her for recurrence of the hydronephrosis with repeat ultrasound and repeat CAT scan if needed.  The other option would be to keep her on a low-dose antibiotic and take her back to the OR for a second look ureteroscopy on the left side to ensure no obvious lesion or stricture.  Possible balloon dilation if needed.  If all that appears benign I think a trial without the stent would be reasonable with a follow-up in the office for stent removal or even simply leaving the string on the stent for a few more days.  All questions answered. I will keep her on a nightly NF until URS. We will proceed with cysto, left URS poss balloon dilation or biopsy and stent exchange or removal.   Discussed one of my colleagues would be doing the case.  - Urinalysis, Complete - CULTURE, URINE COMPREHENSIVE   No follow-ups on file.  Jerilee Field, MD  [facility] [address], [address] [place], [place] [address] [phone]

## 2019-07-02 LAB — MICROSCOPIC EXAMINATION: WBC, UA: >30 /hpf — AB (ref 0–5)

## 2019-07-02 LAB — URINALYSIS, COMPLETE
Bilirubin, UA: NEGATIVE
Glucose, UA: NEGATIVE
Nitrite, UA: NEGATIVE
Specific Gravity, UA: >1.03 — ABNORMAL HIGH (ref 1.005–1.030)
Urobilinogen, Ur: 0.2 mg/dL (ref 0.2–1.0)
pH, UA: 5.5 (ref 5.0–7.5)

## 2019-07-03 ENCOUNTER — Encounter: Payer: Self-pay | Admitting: Physician Assistant

## 2019-07-03 ENCOUNTER — Ambulatory Visit (INDEPENDENT_AMBULATORY_CARE_PROVIDER_SITE_OTHER): Payer: Self-pay | Admitting: Physician Assistant

## 2019-07-03 ENCOUNTER — Other Ambulatory Visit: Payer: Self-pay

## 2019-07-03 ENCOUNTER — Telehealth: Payer: Self-pay

## 2019-07-03 VITALS — BP 144/86 | HR 109 | Temp 99.4°F | Ht 66.0 in | Wt 235.0 lb

## 2019-07-03 DIAGNOSIS — N135 Crossing vessel and stricture of ureter without hydronephrosis: Secondary | ICD-10-CM

## 2019-07-03 LAB — CULTURE, URINE COMPREHENSIVE

## 2019-07-03 MED ORDER — CIPROFLOXACIN HCL 500 MG PO TABS: 500.0000 mg | ORAL_TABLET | Freq: Two times a day (BID) | ORAL | 0 refills | Status: AC

## 2019-07-03 MED ORDER — FLUCONAZOLE 200 MG PO TABS: 200.0000 mg | ORAL_TABLET | Freq: Every day | ORAL | 0 refills | Status: AC

## 2019-07-03 NOTE — Telephone Encounter (Signed)
Patient calls w c/o possible fever (no thermometer), chills, nausea, front left side pain to which oxybutinin is not relieving. Patient currently has stent in. Per Sam ok to bring in to rule out infection. Appt made for today 07/03/19.

## 2019-07-03 NOTE — Progress Notes (Signed)
07/03/2019 4:32 PM   Katelyn Barnes Jun 30, 1980 [id number]  CC: Chief Complaint  Patient presents with  . Flank Pain    HPI: Katelyn Barnes is a 40 y.o. female with PMH diabetes and recent hospitalization with E. 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  She is scheduled for cystoscopy with left ureteroscopy and stent exchange versus removal with ureteral biopsy and balloon dilation of ureteral stricture with Dr. Richardo Hanks in 3 days.  Preop urine culture dated 06/29/2019 resulted with mixed urogenital flora; UA notable at that time for yeast.  She presents to clinic today with reports of a 1-day history of LLQ pain, chills, nausea, pallor, and possible subjective fever. She reports oxybutynin is no longer helping with her pain. She has been taking nitrofurantoin 100mg  daily for UTI suppression in advance of surgery.  In-office UA today positive for 2+ blood, 3+ protein, and 1+ leukocyte esterase; urine microscopy with 11-30 WBCs/HPF, 3-10 RBCs/HPF, and yeast. I obtained a catheterized urine sample based on these results; catheterized UA positive for 2+ blood, 3+ protein, and 1+ leukocyte esterase; urine microscopy with 11-30 WBCs/HPF and yeast.   PMH: Past Medical History:  Diagnosis Date  . DDD (degenerative disc disease), lumbar   . Diabetes mellitus without complication (HCC)    Type II  . DVT (deep venous thrombosis) (HCC) 2016   post back surgery - was on Eliquis 3 months  . Kidney stone     Surgical History: Past Surgical History:  Procedure Laterality Date  . ANTERIOR CERVICAL DECOMP/DISCECTOMY FUSION Right 05/05/2016   Procedure: ANTERIOR CERVICAL DECOMPRESSION FUSION, CERVICAL 7- THORACIC 1 WITH INSTRUMENTATION AND ALLOGRAFT;  Surgeon: 05/07/2016, MD;  Location: [facility] OR;  Service: Orthopedics;  Laterality: Right;   ANTERIOR CERVICAL DECOMPRESSION FUSION, CERVICAL 7- THORACIC 1 WITH INSTRUMENTATION AND ALLOGRAFT  . BACK SURGERY  2016   discectomy   . CARPAL TUNNEL RELEASE Bilateral   . CYSTOSCOPY WITH URETEROSCOPY AND STENT PLACEMENT Left 06/11/2019   Procedure: CYSTOSCOPY WITH URETEROSCOPY AND STENT PLACEMENT;  Surgeon: 06/13/2019, MD;  Location: [facility] ORS;  Service: Urology;  Laterality: Left;  . TUBAL LIGATION  08/2007    Home Medications:  Allergies as of 07/03/2019      Reactions   Penicillins Rash   14 years ago      Medication List       Accurate as of July 03, 2019  4:32 PM. If you have any questions, ask your nurse or doctor.        STOP taking these medications   nitrofurantoin (macrocrystal-monohydrate) 100 MG capsule Commonly known as: MACROBID Stopped by: July 05, 2019, PA-C     TAKE these medications   ciprofloxacin 500 MG tablet Commonly known as: Cipro Take 1 tablet (500 mg total) by mouth 2 (two) times daily for 5 days. Started by: Carman Ching, PA-C   fluconazole 200 MG tablet Commonly known as: DIFLUCAN Take 1 tablet (200 mg total) by mouth daily for 14 days. Started by: Carman Ching, PA-C   insulin aspart protamine- aspart (70-30) 100 UNIT/ML injection Commonly known as: NOVOLOG MIX 70/30 Inject 40 Units into the skin 2 (two) times daily with a meal.   metFORMIN 1000 MG tablet Commonly known as: GLUCOPHAGE Take 1,000 mg by mouth 2 (two) times daily with a meal.   oxybutynin 5 MG tablet Commonly known as: DITROPAN Take 1 tablet (5  mg total) by mouth 3 (three) times daily as needed for bladder spasms.   Soliqua 100-33 UNT-MCG/ML Sopn Generic drug: Insulin Glargine-Lixisenatide Inject 40 Units into the skin daily at 12 noon.       Allergies:  Allergies  Allergen Reactions  . Penicillins Rash    14 years ago     Family History: No family history on file.  Social History:   reports that she has never smoked. She  has never used smokeless tobacco. She reports that she does not drink alcohol and does not use drugs.  Physical Exam: BP (!) 144/86 (BP Location: Left Arm, Patient Position: Sitting, Cuff Size: Large)   Pulse (!) 109   Temp 99.4 F (37.4 C) (Temporal)   Ht 5\' 6"  (1.676 m)   Wt 235 lb (106.6 kg)   LMP 06/05/2019 Comment: neg hgc 06/11/19  BMI 37.93 kg/m   Constitutional:  Alert and oriented, no acute distress HEENT: , AT Cardiovascular: No clubbing, cyanosis, or edema Respiratory: Normal respiratory effort, no increased work of breathing Skin: Pallor Neurologic: Grossly intact, no focal deficits, moving all 4 extremities Psychiatric: Normal mood and affect  Laboratory Data: Results for orders placed or performed in visit on 07/03/19  CULTURE, URINE COMPREHENSIVE   Specimen: Urine   UR  Result Value Ref Range   Urine Culture, Comprehensive Preliminary report    Organism ID, Bacteria Comment   Microscopic Examination   Urine  Result Value Ref Range   WBC, UA 11-30 (A) 0 - 5 /hpf   RBC 3-10 (A) 0 - 2 /hpf   Epithelial Cells (non renal) None seen 0 - 10 /hpf   Bacteria, UA Few None seen/Few   Yeast, UA Present (A) None seen  Microscopic Examination   Urine  Result Value Ref Range   WBC, UA 11-30 (A) 0 - 5 /hpf   RBC 0-2 0 - 2 /hpf   Epithelial Cells (non renal) 0-10 0 - 10 /hpf   Crystals Present (A) N/A   Crystal Type Amorphous Sediment N/A   Bacteria, UA Few None seen/Few   Yeast, UA Present (A) None seen  Urinalysis, Complete  Result Value Ref Range   Specific Gravity, UA 1.020 1.005 - 1.030   pH, UA 6.0 5.0 - 7.5   Color, UA Yellow Yellow   Appearance Ur Cloudy (A) Clear   Leukocytes,UA 1+ (A) Negative   Protein,UA 3+ (A) Negative/Trace   Glucose, UA Negative Negative   Ketones, UA Negative Negative   RBC, UA 2+ (A) Negative   Bilirubin, UA Negative Negative   Urobilinogen, Ur 0.2 0.2 - 1.0 mg/dL   Nitrite, UA Negative Negative   Microscopic Examination See  below:   Urinalysis, Complete  Result Value Ref Range   Specific Gravity, UA 1.020 1.005 - 1.030   pH, UA 6.0 5.0 - 7.5   Color, UA Yellow Yellow   Appearance Ur Cloudy (A) Clear   Leukocytes,UA 1+ (A) Negative   Protein,UA 3+ (A) Negative/Trace   Glucose, UA Negative Negative   Ketones, UA Negative Negative   RBC, UA 2+ (A) Negative   Bilirubin, UA Negative Negative   Urobilinogen, Ur 0.2 0.2 - 1.0 mg/dL   Nitrite, UA Negative Negative   Microscopic Examination See below:    Assessment & Plan:   1. Obstruction of left ureter Catheterized UA with yeast; patient appears pale and is tachycardic in clinic today.  Will start empiric Diflucan and ciprofloxacin and send catheterized urine sample for culture  for further evaluation.  Will notify Dr. Diamantina Providence in advance of planned surgery in 3 days. - Urinalysis, Complete - Urinalysis, Complete - CULTURE, URINE COMPREHENSIVE - ciprofloxacin (CIPRO) 500 MG tablet; Take 1 tablet (500 mg total) by mouth 2 (two) times daily for 5 days.  Dispense: 10 tablet; Refill: 0 - fluconazole (DIFLUCAN) 200 MG tablet; Take 1 tablet (200 mg total) by mouth daily for 14 days.  Dispense: 14 tablet; Refill: 0   Return for As scheduled .  Debroah Loop, PA-C  [facility] [address] [place], [place] [address] [phone]

## 2019-07-03 NOTE — Patient Instructions (Addendum)
Stop Macrobid, begin Ciprofloxacin and Fluconazole

## 2019-07-04 ENCOUNTER — Other Ambulatory Visit
Admission: RE | Admit: 2019-07-04 | Discharge: 2019-07-04 | Disposition: A | Discharge status: Home / Self Care | Payer: Medicaid Other | Source: Ambulatory Visit | Attending: Urology | Admitting: Urology

## 2019-07-04 DIAGNOSIS — Z01818 Encounter for other preprocedural examination: Secondary | ICD-10-CM | POA: Insufficient documentation

## 2019-07-04 DIAGNOSIS — E118 Type 2 diabetes mellitus with unspecified complications: Secondary | ICD-10-CM | POA: Insufficient documentation

## 2019-07-04 DIAGNOSIS — Z20822 Contact with and (suspected) exposure to covid-19: Secondary | ICD-10-CM | POA: Insufficient documentation

## 2019-07-04 DIAGNOSIS — N1330 Unspecified hydronephrosis: Secondary | ICD-10-CM | POA: Insufficient documentation

## 2019-07-04 NOTE — Patient Instructions (Addendum)
Your procedure is scheduled on: Friday 07/06/19.  Report to DAY SURGERY DEPARTMENT LOCATED ON 2ND FLOOR MEDICAL MALL ENTRANCE. To find out your arrival time please call [phone] between 1PM - 3PM on Thursday 07/05/19.   Remember: Instructions that are not followed completely may result in serious medical risk, up to and including death, or upon the discretion of your surgeon and anesthesiologist your surgery may need to be rescheduled.      _X__ 1. Do not eat food after midnight the night before your procedure.                 No gum chewing or hard candies. You may drink SUGAR FREE clear liquids up to 2 hours                 before you are scheduled to arrive for your surgery- DO NOT drink clear                 liquids within 2 hours of the start of your surgery.                    __X__2.  On the morning of surgery brush your teeth with toothpaste and water, you may rinse your mouth with mouthwash if you wish.  Do not swallow any toothpaste or mouthwash.       _X__ 3.  No Alcohol for 24 hours before or after surgery.     _X__ 4.  Do Not Smoke or use e-cigarettes For 24 Hours Prior to Your Surgery.                 Do not use any chewable tobacco products for at least 6 hours prior to                 surgery.    __X__5.  Notify your doctor if there is any change in your medical condition      (cold, fever, infections).       Do not wear jewelry, make-up, hairpins, clips or nail polish. Do not wear lotions, powders, or perfumes.  Do not shave 48 hours prior to surgery. Men may shave face and neck. Do not bring valuables to the hospital.     [facility] is not responsible for any belongings or valuables.   Contacts, dentures/partials or body piercings may not be worn into surgery. Bring a case for your contacts, glasses or hearing aids, a denture cup will be supplied.   Patients discharged the day of surgery will not be allowed to drive home.     __X__ Take these  medicines the morning of surgery with A SIP OF WATER:     1. ciprofloxacin (CIPRO)  2. fluconazole (DIFLUCAN)   3. oxybutynin (DITROPAN) if needed      __X__ Stop Metformin 2 days prior to surgery.      __X__Take 1/2 of usual insulin dose the night before surgery. No insulin the morning          of surgery.    __X__ Stop Anti-inflammatories 7 days before surgery such as Advil, Ibuprofen, Motrin, BC or Goodies Powder, Naprosyn, Naproxen, Aleve, Aspirin, Meloxicam. May take Tylenol if needed for pain or discomfort.   __X__ Don't start taking any new herbal supplements or vitamins prior to your surgery.

## 2019-07-05 ENCOUNTER — Encounter: Payer: Self-pay | Admitting: Urgent Care

## 2019-07-05 ENCOUNTER — Other Ambulatory Visit: Payer: Self-pay

## 2019-07-05 ENCOUNTER — Encounter
Admission: RE | Admit: 2019-07-05 | Discharge: 2019-07-05 | Disposition: A | Discharge status: Home / Self Care | Payer: Medicaid Other | Source: Ambulatory Visit | Attending: Urology | Admitting: Urology

## 2019-07-05 DIAGNOSIS — Z20822 Contact with and (suspected) exposure to covid-19: Secondary | ICD-10-CM | POA: Diagnosis not present

## 2019-07-05 DIAGNOSIS — E118 Type 2 diabetes mellitus with unspecified complications: Secondary | ICD-10-CM | POA: Diagnosis not present

## 2019-07-05 DIAGNOSIS — N1330 Unspecified hydronephrosis: Secondary | ICD-10-CM | POA: Diagnosis not present

## 2019-07-05 DIAGNOSIS — Z01818 Encounter for other preprocedural examination: Secondary | ICD-10-CM | POA: Diagnosis present

## 2019-07-05 LAB — URINALYSIS, COMPLETE
Bilirubin, UA: NEGATIVE
Bilirubin, UA: NEGATIVE
Glucose, UA: NEGATIVE
Glucose, UA: NEGATIVE
Ketones, UA: NEGATIVE
Ketones, UA: NEGATIVE
Nitrite, UA: NEGATIVE
Nitrite, UA: NEGATIVE
Specific Gravity, UA: 1.02 (ref 1.005–1.030)
Specific Gravity, UA: 1.02 (ref 1.005–1.030)
Urobilinogen, Ur: 0.2 mg/dL (ref 0.2–1.0)
Urobilinogen, Ur: 0.2 mg/dL (ref 0.2–1.0)
pH, UA: 6 (ref 5.0–7.5)
pH, UA: 6 (ref 5.0–7.5)

## 2019-07-05 LAB — CBC
HCT: 27.7 % — ABNORMAL LOW (ref 36.0–46.0)
Hemoglobin: 8.7 g/dL — ABNORMAL LOW (ref 12.0–15.0)
MCH: 26 pg (ref 26.0–34.0)
MCHC: 31.4 g/dL (ref 30.0–36.0)
MCV: 82.9 fL (ref 80.0–100.0)
Platelets: 433 10*3/uL — ABNORMAL HIGH (ref 150–400)
RBC: 3.34 MIL/uL — ABNORMAL LOW (ref 3.87–5.11)
RDW: 13.6 % (ref 11.5–15.5)
WBC: 8 10*3/uL (ref 4.0–10.5)
nRBC: 0 % (ref 0.0–0.2)

## 2019-07-05 LAB — BASIC METABOLIC PANEL
Anion gap: 11 (ref 5–15)
BUN: 6 mg/dL (ref 6–20)
CO2: 27 mmol/L (ref 22–32)
Calcium: 8.5 mg/dL — ABNORMAL LOW (ref 8.9–10.3)
Chloride: 97 mmol/L — ABNORMAL LOW (ref 98–111)
Creatinine, Ser: 0.81 mg/dL (ref 0.44–1.00)
GFR calc Af Amer: >60 mL/min (ref >60)
GFR calc non Af Amer: >60 mL/min (ref >60)
Glucose, Bld: 169 mg/dL — ABNORMAL HIGH (ref 70–99)
Potassium: 3.4 mmol/L — ABNORMAL LOW (ref 3.5–5.1)
Sodium: 135 mmol/L (ref 135–145)

## 2019-07-05 LAB — SARS CORONAVIRUS 2 (TAT 6-24 HRS): SARS Coronavirus 2: NEGATIVE

## 2019-07-05 LAB — MICROSCOPIC EXAMINATION: Epithelial Cells (non renal): NONE SEEN /hpf (ref 0–10)

## 2019-07-05 MED ORDER — LEVOFLOXACIN IN D5W 500 MG/100ML IV SOLN
500.0000 mg | INTRAVENOUS | Status: AC
  Administered 2019-07-06: 500 mg via INTRAVENOUS

## 2019-07-05 NOTE — Progress Notes (Addendum)
  [facility] Perioperative Services: Pre-Admission/Anesthesia Testing  Abnormal Lab Notification  Date: 07/05/19  Name: Katelyn Barnes MRN:   [id number]  Re: Abnormal labs noted during PAT appointment  Provider Notified: [name], MD Notification mode: Routed via [facility] of concern:  Hemoglobin 8.7 and hematocrit 27.7 (previously 10 and 29.8 on 06/16/2019)  ADDENDUM 07/05/2019 at 1330: Spoke with anesthesiologist Karlton Lemon, MD). Ok to proceed. Will recheck H&H and add T&S tomorrow when patient arrives for her procedure. Orders entered into [facility] and noted to PAT worksheet.   Quentin Mulling, MSN, APRN, FNP-C, CEN [facility]  Peri-operative Services Nurse Practitioner Phone: [phone] 07/05/19 11:03 AM

## 2019-07-06 ENCOUNTER — Encounter: Payer: Self-pay | Admitting: Urology

## 2019-07-06 ENCOUNTER — Encounter
Admission: RE | Disposition: A | Discharge status: Home / Self Care | Payer: Self-pay | Source: Home / Self Care | Attending: Urology

## 2019-07-06 ENCOUNTER — Ambulatory Visit: Payer: Medicaid Other

## 2019-07-06 ENCOUNTER — Ambulatory Visit
Admission: RE | Admit: 2019-07-06 | Discharge: 2019-07-06 | Disposition: A | Discharge status: Home / Self Care | Payer: Medicaid Other | Attending: Urology | Admitting: Urology

## 2019-07-06 ENCOUNTER — Ambulatory Visit: Payer: Medicaid Other | Admitting: Anesthesiology

## 2019-07-06 ENCOUNTER — Other Ambulatory Visit: Payer: Self-pay

## 2019-07-06 DIAGNOSIS — N1330 Unspecified hydronephrosis: Secondary | ICD-10-CM | POA: Diagnosis not present

## 2019-07-06 DIAGNOSIS — Z794 Long term (current) use of insulin: Secondary | ICD-10-CM | POA: Insufficient documentation

## 2019-07-06 DIAGNOSIS — N135 Crossing vessel and stricture of ureter without hydronephrosis: Secondary | ICD-10-CM

## 2019-07-06 DIAGNOSIS — E119 Type 2 diabetes mellitus without complications: Secondary | ICD-10-CM | POA: Diagnosis not present

## 2019-07-06 HISTORY — PX: CYSTOSCOPY WITH URETEROSCOPY AND STENT PLACEMENT: SHX6377

## 2019-07-06 HISTORY — PX: CYSTOSCOPY W/ RETROGRADES: SHX1426

## 2019-07-06 LAB — GLUCOSE, CAPILLARY
Glucose-Capillary: 108 mg/dL — ABNORMAL HIGH (ref 70–99)
Glucose-Capillary: 90 mg/dL (ref 70–99)
Glucose-Capillary: 102 mg/dL — ABNORMAL HIGH (ref 70–99)

## 2019-07-06 LAB — POCT PREGNANCY, URINE: Preg Test, Ur: NEGATIVE

## 2019-07-06 LAB — CULTURE, URINE COMPREHENSIVE

## 2019-07-06 LAB — HEMOGLOBIN AND HEMATOCRIT, BLOOD
HCT: 26.1 % — ABNORMAL LOW (ref 36.0–46.0)
Hemoglobin: 8.6 g/dL — ABNORMAL LOW (ref 12.0–15.0)

## 2019-07-06 LAB — TYPE AND SCREEN
ABO/RH(D): A POS
Antibody Screen: NEGATIVE

## 2019-07-06 SURGERY — CYSTOURETEROSCOPY, WITH STENT INSERTION
Anesthesia: General | Site: Ureter | Laterality: Left

## 2019-07-06 MED ORDER — FENTANYL CITRATE (PF) 100 MCG/2ML IJ SOLN: 25.0000 ug | INTRAMUSCULAR | Status: DC | PRN

## 2019-07-06 MED ORDER — SODIUM CHLORIDE 0.9 % IV SOLN: INTRAVENOUS | Status: DC

## 2019-07-06 MED ORDER — LIDOCAINE HCL (PF) 2 % IJ SOLN
INTRAMUSCULAR | Status: DC | PRN
  Administered 2019-07-06: 50 mg

## 2019-07-06 MED ORDER — ORAL CARE MOUTH RINSE: 15.0000 mL | Freq: Once | OROMUCOSAL | Status: AC

## 2019-07-06 MED ORDER — ONDANSETRON HCL 4 MG/2ML IJ SOLN
INTRAMUSCULAR | Status: AC
  Administered 2019-07-06: 4 mg via INTRAVENOUS
  Filled 2019-07-06: qty 2

## 2019-07-06 MED ORDER — CHLORHEXIDINE GLUCONATE 0.12 % MT SOLN
15.0000 mL | Freq: Once | OROMUCOSAL | Status: AC
  Administered 2019-07-06: 15 mL via OROMUCOSAL

## 2019-07-06 MED ORDER — LIDOCAINE HCL (PF) 2 % IJ SOLN
INTRAMUSCULAR | Status: AC
  Filled 2019-07-06: qty 5

## 2019-07-06 MED ORDER — ONDANSETRON HCL 4 MG/2ML IJ SOLN
INTRAMUSCULAR | Status: DC | PRN
  Administered 2019-07-06: 4 mg via INTRAVENOUS

## 2019-07-06 MED ORDER — DEXMEDETOMIDINE HCL IN NACL 200 MCG/50ML IV SOLN
INTRAVENOUS | Status: DC | PRN
Start: 2019-07-06 — End: 2019-07-06
  Administered 2019-07-06: 12 ug via INTRAVENOUS

## 2019-07-06 MED ORDER — HYDROCODONE-ACETAMINOPHEN 5-325 MG PO TABS: 1.0000 | ORAL_TABLET | ORAL | 0 refills | Status: AC | PRN

## 2019-07-06 MED ORDER — MIDAZOLAM HCL 2 MG/2ML IJ SOLN
INTRAMUSCULAR | Status: AC
  Filled 2019-07-06: qty 2

## 2019-07-06 MED ORDER — MIDAZOLAM HCL 5 MG/5ML IJ SOLN
INTRAMUSCULAR | Status: DC | PRN
  Administered 2019-07-06: 2 mg via INTRAVENOUS

## 2019-07-06 MED ORDER — LEVOFLOXACIN IN D5W 500 MG/100ML IV SOLN
INTRAVENOUS | Status: AC
  Filled 2019-07-06: qty 100

## 2019-07-06 MED ORDER — ONDANSETRON HCL 4 MG/2ML IJ SOLN: 4.0000 mg | Freq: Once | INTRAMUSCULAR | Status: DC | PRN

## 2019-07-06 MED ORDER — DEXMEDETOMIDINE HCL IN NACL 80 MCG/20ML IV SOLN
INTRAVENOUS | Status: AC
  Filled 2019-07-06: qty 20

## 2019-07-06 MED ORDER — GLYCOPYRROLATE 0.2 MG/ML IJ SOLN
INTRAMUSCULAR | Status: AC
  Filled 2019-07-06: qty 1

## 2019-07-06 MED ORDER — GLYCOPYRROLATE 0.2 MG/ML IJ SOLN
INTRAMUSCULAR | Status: DC | PRN
  Administered 2019-07-06: .2 mg via INTRAVENOUS

## 2019-07-06 MED ORDER — PROPOFOL 10 MG/ML IV BOLUS
INTRAVENOUS | Status: DC | PRN
  Administered 2019-07-06: 150 mg via INTRAVENOUS
  Administered 2019-07-06: 50 mg via INTRAVENOUS

## 2019-07-06 MED ORDER — DEXAMETHASONE SODIUM PHOSPHATE 10 MG/ML IJ SOLN
INTRAMUSCULAR | Status: DC | PRN
  Administered 2019-07-06: 10 mg via INTRAVENOUS

## 2019-07-06 MED ORDER — CHLORHEXIDINE GLUCONATE 0.12 % MT SOLN
OROMUCOSAL | Status: AC
  Filled 2019-07-06: qty 15

## 2019-07-06 MED ORDER — ONDANSETRON HCL 4 MG/2ML IJ SOLN: 4.0000 mg | Freq: Once | INTRAMUSCULAR | Status: AC

## 2019-07-06 MED ORDER — IOHEXOL 180 MG/ML  SOLN
INTRAMUSCULAR | Status: DC | PRN
  Administered 2019-07-06: 20 mL

## 2019-07-06 MED ORDER — FENTANYL CITRATE (PF) 100 MCG/2ML IJ SOLN
INTRAMUSCULAR | Status: AC
  Filled 2019-07-06: qty 2

## 2019-07-06 MED ORDER — FENTANYL CITRATE (PF) 100 MCG/2ML IJ SOLN
INTRAMUSCULAR | Status: DC | PRN
  Administered 2019-07-06 (×2): 50 ug via INTRAVENOUS

## 2019-07-06 SURGICAL SUPPLY — 20 items
BAG DRAIN CYSTO-URO LG1000N (MISCELLANEOUS) ×3 IMPLANT
BRUSH SCRUB EZ  4% CHG (MISCELLANEOUS) ×1
BRUSH SCRUB EZ 4% CHG (MISCELLANEOUS) ×2 IMPLANT
DRSG TELFA 4X3 1S NADH ST (GAUZE/BANDAGES/DRESSINGS) ×3 IMPLANT
FORCEPS BIOP PIRANHA Y (CUTTING FORCEPS) IMPLANT
GLOVE BIOGEL PI IND STRL 7.5 (GLOVE) ×2 IMPLANT
GLOVE BIOGEL PI INDICATOR 7.5 (GLOVE) ×1
GOWN STRL REUS W/ TWL LRG LVL3 (GOWN DISPOSABLE) ×2 IMPLANT
GOWN STRL REUS W/ TWL XL LVL3 (GOWN DISPOSABLE) ×2 IMPLANT
GOWN STRL REUS W/TWL LRG LVL3 (GOWN DISPOSABLE) ×1
GOWN STRL REUS W/TWL XL LVL3 (GOWN DISPOSABLE) ×1
GUIDEWIRE STR DUAL SENSOR (WIRE) ×3 IMPLANT
INFUSOR MANOMETER BAG 3000ML (MISCELLANEOUS) ×3 IMPLANT
KIT TURNOVER CYSTO (KITS) ×3 IMPLANT
PACK CYSTO AR (MISCELLANEOUS) ×3 IMPLANT
SET CYSTO W/LG BORE CLAMP LF (SET/KITS/TRAYS/PACK) ×3 IMPLANT
STENT URET 6FRX26 CONTOUR (STENTS) ×3 IMPLANT
SURGILUBE 2OZ TUBE FLIPTOP (MISCELLANEOUS) ×3 IMPLANT
WATER STERILE IRR 1000ML POUR (IV SOLUTION) ×3 IMPLANT
WATER STERILE IRR 3000ML UROMA (IV SOLUTION) ×3 IMPLANT

## 2019-07-06 NOTE — Anesthesia Procedure Notes (Signed)
Procedure Name: LMA Insertion Performed by: Sarahbeth Cashin, CRNA Pre-anesthesia Checklist: Patient identified, Patient being monitored, Timeout performed, Emergency Drugs available and Suction available Patient Re-evaluated:Patient Re-evaluated prior to induction Oxygen Delivery Method: Circle system utilized Preoxygenation: Pre-oxygenation with 100% oxygen Induction Type: IV induction Ventilation: Mask ventilation without difficulty LMA: LMA inserted LMA Size: 4.0 Tube type: Oral Number of attempts: 1 Placement Confirmation: positive ETCO2 and breath sounds checked- equal and bilateral Tube secured with: Tape Dental Injury: Teeth and Oropharynx as per pre-operative assessment        

## 2019-07-06 NOTE — Anesthesia Postprocedure Evaluation (Signed)
Anesthesia Post Note  Patient: Katelyn Barnes  Procedure(s) Performed: CYSTOSCOPY WITH URETEROSCOPY AND STENT exchange (Left Ureter) CYSTOSCOPY WITH RETROGRADE PYELOGRAM (Left Ureter)  Patient location during evaluation: PACU Anesthesia Type: General Level of consciousness: awake and alert Pain management: pain level controlled Vital Signs Assessment: post-procedure vital signs reviewed and stable Respiratory status: spontaneous breathing, nonlabored ventilation, respiratory function stable and patient connected to nasal cannula oxygen Cardiovascular status: blood pressure returned to baseline and stable Postop Assessment: no apparent nausea or vomiting Anesthetic complications: no   No complications documented.   Last Vitals:  Vitals:   07/06/19 1702 07/06/19 1707  BP:  (!) 128/92  Pulse: (!) 104 98  Resp: 17 (!) 27  Temp: 37.3 C 37.2 C  SpO2: 95% 95%    Last Pain:  Vitals:   07/06/19 1707  TempSrc:   PainSc: 0-No pain                 Cleda Mccreedy Lang Zingg

## 2019-07-06 NOTE — Transfer of Care (Signed)
Immediate Anesthesia Transfer of Care Note  Patient: Katelyn Barnes  Procedure(s) Performed: CYSTOSCOPY WITH URETEROSCOPY AND STENT exchange (Left Ureter) CYSTOSCOPY WITH RETROGRADE PYELOGRAM (Left Ureter)  Patient Location: PACU  Anesthesia Type:General  Level of Consciousness: sedated  Airway & Oxygen Therapy: Patient Spontanous Breathing and Patient connected to face mask oxygen  Post-op Assessment: Report given to RN and Post -op Vital signs reviewed and stable  Post vital signs: Reviewed  Last Vitals:  Vitals Value Taken Time  BP    Temp    Pulse 85 07/06/19 1637  Resp 26 07/06/19 1637  SpO2 100 % 07/06/19 1637  Vitals shown include unvalidated device data.  Last Pain:  Vitals:   07/06/19 1048  TempSrc: Temporal  PainSc: 0-No pain         Complications: No complications documented.

## 2019-07-06 NOTE — Op Note (Signed)
Date of procedure: 07/06/19  Preoperative diagnosis:  1. Left hydroureteronephrosis    Postoperative diagnosis:  1. Same  Procedure: 1. Cystoscopy, left retrograde pyelogram with intraoperative interpretation, left ureteral stent placement  Surgeon: Legrand Rams, MD  Anesthesia: General  Complications: None  Intraoperative findings:  1.  Normal cystoscopy 2.  No abnormalities on left retrograde pyelogram 3.  No abnormalities on left diagnostic ureteroscopy  EBL: None  Specimens: None  Drains: Left 6 French by 26 cm ureteral stent with Dangler attached  Indication: Yen Wandell is a 39 y.o. patient who previously presented with sepsis from urinary source and left hydronephrosis down to the distal ureter. She underwent brief ureteroscopy with Dr. Mena Goes at that time which showed no obvious strictures or stones, and there was a mucous plug evacuated from the bladder at that time. She presents today for definitive diagnosis and to rule out any other etiology of her left hydronephrosis.  After reviewing the management options for treatment, they elected to proceed with the above surgical procedure(s). We have discussed the potential benefits and risks of the procedure, side effects of the proposed treatment, the likelihood of the patient achieving the goals of the procedure, and any potential problems that might occur during the procedure or recuperation. Informed consent has been obtained.  Description of procedure:  The patient was taken to the operating room and general anesthesia was induced. SCDs were placed for DVT prophylaxis. The patient was placed in the dorsal lithotomy position, prepped and draped in the usual sterile fashion, and preoperative antibiotics(Levaquin) were administered. A preoperative time-out was performed.   A 21 French rigid cystoscope was used to intubate the urethra and thorough cystoscopy was performed.  The bladder was grossly normal.  The stent  emanating from the left ureter was grasped and removed in its entirety.  A left retrograde pyelogram was performed with a 5 French access catheter and showed no filling defects, strictures, or other abnormalities.  A sensor wire was then advanced easily up into the kidney and a semirigid ureteroscope was advanced alongside the wire.  Thorough ureteroscopy was performed up to the proximal ureter and showed a widely patent ureter throughout without any strictures or filling defects.  A 6 French by 26 cm ureteral stent was placed over Dangler with excellent positioning on fluoroscopy.  The bladder was drained.  The Dangler was secured to the right inner thigh with Tegaderm.  Disposition: Stable to PACU  Plan: Remove stent at home on Monday morning Follow-up in 4 weeks with renal ultrasound prior  Legrand Rams, MD

## 2019-07-06 NOTE — Discharge Instructions (Signed)

## 2019-07-06 NOTE — H&P (Signed)
UROLOGY H&P UPDATE  Agree with prior H&P dated 06/11/2019 by Dr. Mena Goes. 39 year old female who was admitted with left pyelonephritis and hydronephrosis down to the left distal ureter of unclear etiology. Brief ureteroscopy and stent placement at her time of presentation by Dr. Mena Goes showed no obvious strictures or stones. A mucous plug was irrigated from the bladder and was thought to be the etiology of her obstruction. She opted for return to the OR for left diagnostic ureteroscopy to rule out any strictures or other etiology of her obstruction.  Cardiac: RRR Lungs: CTA bilaterally  Laterality: Left Procedure: Left retrograde pyelogram and diagnostic left ureteroscopy  Urine: Urine culture 6/22 with no growth, yeast present on urinalysis and has been on fluconazole  We specifically discussed the risks ureteroscopy including bleeding, infection/sepsis, stent related symptoms including flank pain/urgency/frequency/incontinence/dysuria, ureteral injury, inability to access stone, or need for staged or additional procedures.   [name], MD 07/06/2019

## 2019-07-06 NOTE — Anesthesia Preprocedure Evaluation (Addendum)
Anesthesia Evaluation  Patient identified by MRN, date of birth, ID band Patient awake    Reviewed: Allergy & Precautions, NPO status , Patient's Chart, lab work & pertinent test results  History of Anesthesia Complications Negative for: history of anesthetic complications  Airway Mallampati: II  TM Distance: >3 FB Neck ROM: Full    Dental  (+) Chipped   Pulmonary neg pulmonary ROS, neg sleep apnea, neg COPD,    breath sounds clear to auscultation- rhonchi (-) wheezing      Cardiovascular Exercise Tolerance: Good (-) hypertension(-) CAD, (-) Past MI, (-) Cardiac Stents and (-) CABG  Rhythm:Regular Rate:Normal - Systolic murmurs and - Diastolic murmurs    Neuro/Psych neg Seizures PSYCHIATRIC DISORDERS Anxiety Depression negative neurological ROS     GI/Hepatic negative GI ROS, Neg liver ROS, neg GERD  ,  Endo/Other  diabetes, Type 2, Insulin Dependent  Renal/GU Renal disease (hx of nephrolithiasis)     Musculoskeletal  (+) Arthritis ,   Abdominal (+) + obese,   Peds  Hematology negative hematology ROS (+)   Anesthesia Other Findings Past Medical History: No date: DDD (degenerative disc disease), lumbar No date: Diabetes mellitus without complication (HCC)     Comment:  Type II 2016: DVT (deep venous thrombosis) (HCC)     Comment:  post back surgery - was on Eliquis 3 months No date: Kidney stone   Reproductive/Obstetrics                            Anesthesia Physical Anesthesia Plan  ASA: III  Anesthesia Plan: General   Post-op Pain Management:    Induction: Intravenous  PONV Risk Score and Plan: 2 and Ondansetron, Dexamethasone and Midazolam  Airway Management Planned: LMA  Additional Equipment:   Intra-op Plan:   Post-operative Plan: Extubation in OR  Informed Consent: I have reviewed the patients History and Physical, chart, labs and discussed the procedure including  the risks, benefits and alternatives for the proposed anesthesia with the patient or authorized representative who has indicated his/her understanding and acceptance.     Dental advisory given  Plan Discussed with: CRNA and Anesthesiologist  Anesthesia Plan Comments: (Patient consented for risks of anesthesia including but not limited to:  - adverse reactions to medications - damage to eyes, teeth, lips or other oral mucosa - nerve damage due to positioning  - sore throat or hoarseness - Damage to heart, brain, nerves, lungs, other parts of body or loss of life  Patient voiced understanding.)       Anesthesia Quick Evaluation

## 2019-07-07 ENCOUNTER — Encounter: Payer: Self-pay | Admitting: Urology

## 2019-08-09 DIAGNOSIS — E611 Iron deficiency: Secondary | ICD-10-CM | POA: Insufficient documentation

## 2019-08-14 ENCOUNTER — Other Ambulatory Visit: Payer: Self-pay

## 2019-08-14 ENCOUNTER — Ambulatory Visit
Admission: RE | Admit: 2019-08-14 | Discharge: 2019-08-14 | Disposition: A | Discharge status: Home / Self Care | Payer: Medicaid Other | Source: Ambulatory Visit | Attending: Urology | Admitting: Urology

## 2019-08-14 DIAGNOSIS — N135 Crossing vessel and stricture of ureter without hydronephrosis: Secondary | ICD-10-CM | POA: Diagnosis not present

## 2019-08-15 ENCOUNTER — Encounter: Payer: Self-pay | Admitting: Urology

## 2019-08-15 ENCOUNTER — Ambulatory Visit (INDEPENDENT_AMBULATORY_CARE_PROVIDER_SITE_OTHER): Payer: Self-pay | Admitting: Urology

## 2019-08-15 VITALS — BP 108/71 | HR 109 | Ht 66.0 in | Wt 214.0 lb

## 2019-08-15 DIAGNOSIS — N1339 Other hydronephrosis: Secondary | ICD-10-CM

## 2019-08-15 NOTE — Progress Notes (Signed)
08/15/2019 10:14 AM   Katelyn Barnes 06-02-1980 [id number]  Referring provider: Cathie Hoops, PA [address] [place],  [place] [address]  Chief Complaint  Patient presents with  . 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 At intitial stent a limited left ureteroscopy revealed possible transition point in the left distal ureter but no stone, mass or stricture. There was a piece of tissue or mucous int he bladder. A follow-up ultrasound after the stent revealed some increased gas in the parenchyma, but no obvious hydronephrosis.  She ultimately improved and was discharged on Cipro.   To be certain, Dr. Richardo Hanks took for repeat URS and left RGP 07/06/2019 which was normal.  Again there was no stricture, mass or stone in the ureter.  A new stent was left with a string and patient removed the stent.  Follow-up ultrasound was done 08/14/2019 but interestingly the collecting system was very echogenic with shadowing which made detection of hydronephrosis difficult.  There was a left ureteral jet and her post void was 23 mL.  Today, she is well. No dysuria or gross hematuria. Her hgb is low and Fe is low. She is on Fe supplement. No flank pain. No stone passage. She did not leave a UA.    PMH: Past Medical History:  Diagnosis Date  . DDD (degenerative disc disease), lumbar   . Diabetes mellitus without complication (HCC)    Type II  . DVT (deep venous thrombosis) (HCC) 2016   post back surgery - was on Eliquis 3 months  . Kidney stone     Surgical History: Past Surgical History:  Procedure Laterality Date  . ANTERIOR CERVICAL DECOMP/DISCECTOMY FUSION Right 05/05/2016   Procedure: ANTERIOR CERVICAL DECOMPRESSION FUSION, CERVICAL 7- THORACIC 1 WITH  INSTRUMENTATION AND ALLOGRAFT;  Surgeon: Estill Bamberg, MD;  Location: [facility] OR;  Service: Orthopedics;  Laterality: Right;  ANTERIOR CERVICAL DECOMPRESSION FUSION, CERVICAL 7- THORACIC 1 WITH INSTRUMENTATION AND ALLOGRAFT  . BACK SURGERY  2016   discectomy   . CARPAL TUNNEL RELEASE Bilateral   . CYSTOSCOPY W/ RETROGRADES Left 07/06/2019   Procedure: CYSTOSCOPY WITH RETROGRADE PYELOGRAM;  Surgeon: [name], MD;  Location: [facility] ORS;  Service: Urology;  Laterality: Left;  . CYSTOSCOPY WITH URETEROSCOPY AND STENT PLACEMENT Left 06/11/2019   Procedure: CYSTOSCOPY WITH URETEROSCOPY AND STENT PLACEMENT;  Surgeon: Jerilee Field, MD;  Location: [facility] ORS;  Service: Urology;  Laterality: Left;  . CYSTOSCOPY WITH URETEROSCOPY AND STENT PLACEMENT Left 07/06/2019   Procedure: CYSTOSCOPY WITH URETEROSCOPY AND STENT exchange;  Surgeon: [name], MD;  Location: [facility] ORS;  Service: Urology;  Laterality: Left;  . SHOULDER ARTHROSCOPY    . TUBAL LIGATION  08/2007    Home Medications:  Allergies as of 08/15/2019      Reactions   Penicillins Rash   14 years ago      Medication List       Accurate as of August 15, 2019 10:14 AM. If you have any questions, ask your nurse or doctor.        STOP taking these medications   metFORMIN 1000 MG tablet Commonly known as: GLUCOPHAGE Stopped by: Jerilee Field, MD   Lawana Chambers 100-33 UNT-MCG/ML Sopn Generic drug: Insulin Glargine-Lixisenatide  Stopped by: Jerilee Field, MD     TAKE these medications   atorvastatin 40 MG tablet Commonly known as: LIPITOR Take by mouth.   ferrous sulfate 325 (65 FE) MG tablet Take by mouth.   HumuLIN 70/30 KwikPen (70-30) 100 UNIT/ML KwikPen Generic drug: insulin isophane & regular human Inject into the skin.   insulin aspart protamine- aspart (70-30) 100 UNIT/ML injection Commonly known as: NOVOLOG MIX 70/30 Inject 40 Units into the skin 2 (two) times daily with a meal.   losartan 50 MG  tablet Commonly known as: COZAAR Take by mouth.   oxybutynin 5 MG tablet Commonly known as: DITROPAN Take 1 tablet (5 mg total) by mouth 3 (three) times daily as needed for bladder spasms.   topiramate 25 MG tablet Commonly known as: TOPAMAX Take by mouth.       Allergies:  Allergies  Allergen Reactions  . Penicillins Rash    14 years ago     Family History: No family history on file.  Social History:  reports that she has never smoked. She has never used smokeless tobacco. She reports that she does not drink alcohol and does not use drugs.   Physical Exam: BP 108/71   Pulse (!) 109   Ht 5\' 6"  (1.676 m)   Wt 214 lb (97.1 kg)   BMI 34.54 kg/m   Constitutional:  Alert and oriented, No acute distress. She looks well.  HEENT:  AT, moist mucus membranes.  Trachea midline, no masses. Cardiovascular: No clubbing, cyanosis, or edema. Respiratory: Normal respiratory effort, no increased work of breathing. GI: Abdomen is soft, nontender, nondistended, no abdominal masses GU: No CVA tenderness Lymph: No cervical or inguinal lymphadenopathy. Skin: No rashes, bruises or suspicious lesions. Neurologic: Grossly intact, no focal deficits, moving all 4 extremities. Psychiatric: Normal mood and affect.  Laboratory Data: Lab Results  Component Value Date   WBC 8.0 07/05/2019   HGB 8.6 (L) 07/06/2019   HCT 26.1 (L) 07/06/2019   MCV 82.9 07/05/2019   PLT 433 (H) 07/05/2019    Lab Results  Component Value Date   CREATININE 0.81 07/05/2019    No results found for: PSA  No results found for: TESTOSTERONE  Lab Results  Component Value Date   HGBA1C 13.2 (H) 06/11/2019    Urinalysis    Component Value Date/Time   COLORURINE YELLOW (A) 06/11/2019 0859   APPEARANCEUR Cloudy (A) 07/03/2019 1618   LABSPEC 1.015 06/11/2019 0859   LABSPEC 1.037 05/27/2013 1839   PHURINE 5.0 06/11/2019 0859   GLUCOSEU Negative 07/03/2019 1618   GLUCOSEU >=500 05/27/2013 1839   HGBUR  MODERATE (A) 06/11/2019 0859   BILIRUBINUR Negative 07/03/2019 1618   BILIRUBINUR Negative 05/27/2013 1839   KETONESUR 5 (A) 06/11/2019 0859   PROTEINUR 3+ (A) 07/03/2019 1618   PROTEINUR 100 (A) 06/11/2019 0859   NITRITE Negative 07/03/2019 1618   NITRITE NEGATIVE 06/11/2019 0859   LEUKOCYTESUR 1+ (A) 07/03/2019 1618   LEUKOCYTESUR TRACE (A) 06/11/2019 0859   LEUKOCYTESUR Negative 05/27/2013 1839    Lab Results  Component Value Date   LABMICR See below: 07/03/2019   WBCUA 11-30 (A) 07/03/2019   LABEPIT 0-10 07/03/2019   BACTERIA Few 07/03/2019    Pertinent Imaging: CT scan and 07/05/2019  No results found for this or any previous visit.  No results found for this or any previous visit.  No results found for this or any previous visit.  No results found for this or any previous visit.  Results  for orders placed during the hospital encounter of 08/14/19  US RENAL  Narrative CLINICAL DATA:  History of hydronephrosis  EXAM: RENAL / URINARY TRACT ULTRASOUND COMPLETE  COMPARISON:  Ultrasound 06/13/2019, CT 06/11/2019  FINDINGS: Right Kidney:  Renal measurements: 13.2 x 6.8 x 7 cm = volume: 328.7 mL . Echogenicity within normal limits. No mass or hydronephrosis visualized.  Left Kidney:  Renal measurements: 15.5 x 8 x 8.9 cm = volume: 574.4 mL. Diffuse echogenic shadowing at the renal collecting system suggestive of air within the collecting system. This limits evaluation for hydronephrosis. Possible interval complex fluid collection at the lower pole of the left kidney, poorly defined.  Bladder:  Debris within the posterior bladder. Prevoid volume of 198.6 CC, postvoid volume of 23.1 cc.  Other:  None.  IMPRESSION: 1. Diffuse echogenic shadowing at the left renal collecting system suspicious for gas within the collecting system, this limits evaluation for hydronephrosis. Consider correlation with CT. Possible development of small complex fluid collection at  the lower pole of left kidney. This could also be evaluated at CT follow-up. 2. Both kidneys are generous in size. There is no right hydronephrosis.  These results will be called to the ordering clinician or representative by the Radiologist Assistant, and communication documented in the PACS or [employer].   Electronically Signed By: Jasmine Pang M.D. On: 08/15/2019 00:09  No results found for this or any previous visit.  No results found for this or any previous visit.  No results found for this or any previous visit.   Assessment & Plan:    Left hydronephrosis - check UA today and consider abx or antifungals. Repeat renal US in about 6 weeks. Clinically, she looks well, afebrile. No flank pain, etc.   No follow-ups on file.  Jerilee Field, MD  [facility] [address] [place], [place] [address] [phone]

## 2019-08-15 NOTE — Patient Instructions (Signed)
Hydronephrosis  Hydronephrosis is the swelling of one or both kidneys due to a blockage that stops urine from flowing out of the body. Kidneys filter waste from the blood and produce urine. This condition can lead to kidney failure and may become life threatening if not treated promptly. What are the causes? Common causes of this condition include:  Problems that occur when a baby is developing in the womb (congenital defect). These can include problems: ? In the kidneys. ? In the tubes that drain urine from the kidneys into the bladder (ureters).  Kidney stones.  Bladder infection.  An enlarged prostate gland.  Scar tissue from a previous surgery or injury.  A blood clot.  A tumor or cyst in the abdomen or pelvis.  Cancer of the prostate, bladder, uterus, ovary, or colon. What are the signs or symptoms? Symptoms of this condition include:  Pain or discomfort in your side (flank).  Pain and swelling in your abdomen.  Nausea and vomiting.  Fever.  Pain when passing urine.  Feelings of urgency when you need to urinate.  Urinating more often than normal. In some cases, you may not have any symptoms. How is this diagnosed? This condition may be diagnosed based on:  Your symptoms and medical history.  A physical exam.  Blood and urine tests.  Imaging tests, such as an ultrasound, CT scan, or MRI.  A procedure in which a scope is inserted into the urethra and used to view parts of the urinary tract and bladder (cystoscopy). How is this treated? Treatment for this condition depends on where the blockage is, how long it has been there, and what caused it. The goal of treatment is to remove the blockage. Treatment may include:  Antibiotic medicines to treat or prevent infection.  A procedure to place a small, thin tube (stent) into a blocked ureter. The stent will keep the ureter open so that urine can drain through it.  A nonsurgical procedure that crushes kidney  stones with shock waves (extracorporeal shock wave lithotripsy).  If kidney failure occurs, treatment may include dialysis or a kidney transplant. Follow these instructions at home:   Take over-the-counter and prescription medicines only as told by your health care provider.  Rest and return to your normal activities as told by your health care provider. Ask your health care provider what activities are safe for you.  Drink enough fluid to keep your urine pale yellow.  If you were prescribed an antibiotic medicine, take it exactly as told by your health care provider. Do not stop taking the antibiotic even if you start to feel better.  Keep all follow-up visits as told by your health care provider. This is important. Contact a health care provider if:  You continue to have symptoms after treatment.  You develop new symptoms.  Your urine becomes cloudy or bloody.  You have a fever. Get help right away if:  You have severe flank or abdominal pain.  You cannot drink fluids without vomiting. Summary  Hydronephrosis is the swelling of one or both kidneys due to a blockage that stops urine from flowing out of the body.  Hydronephrosis can lead to kidney failure and may become life threatening if not treated promptly.  The goal of treatment is to treat the cause of the blockage. It may include insertion of stent into a blocked ureter, a procedure to treat kidney stones, and antibiotic medicines.  Follow your health care provider's instructions for taking care of yourself at   home, including instructions about drinking fluids, taking medicines, and limiting activities. This information is not intended to replace advice given to you by your health care provider. Make sure you discuss any questions you have with your health care provider. Document Revised: 01/08/2017 Document Reviewed: 01/08/2017 Elsevier Patient Education  2020 [employer]  

## 2019-08-17 LAB — URINALYSIS, COMPLETE
Bilirubin, UA: NEGATIVE
Glucose, UA: NEGATIVE
Nitrite, UA: NEGATIVE
Specific Gravity, UA: 1.025 (ref 1.005–1.030)
Urobilinogen, Ur: 0.2 mg/dL (ref 0.2–1.0)
pH, UA: 5.5 (ref 5.0–7.5)

## 2019-08-17 LAB — MICROSCOPIC EXAMINATION: WBC, UA: >30 /hpf — AB (ref 0–5)

## 2019-08-19 LAB — CULTURE, URINE COMPREHENSIVE

## 2019-08-26 ENCOUNTER — Emergency Department: Payer: Medicaid Other

## 2019-08-26 ENCOUNTER — Other Ambulatory Visit: Payer: Self-pay

## 2019-08-26 DIAGNOSIS — D509 Iron deficiency anemia, unspecified: Secondary | ICD-10-CM | POA: Diagnosis not present

## 2019-08-26 DIAGNOSIS — R42 Dizziness and giddiness: Secondary | ICD-10-CM | POA: Insufficient documentation

## 2019-08-26 DIAGNOSIS — Z955 Presence of coronary angioplasty implant and graft: Secondary | ICD-10-CM | POA: Insufficient documentation

## 2019-08-26 DIAGNOSIS — R531 Weakness: Secondary | ICD-10-CM | POA: Diagnosis present

## 2019-08-26 DIAGNOSIS — E1165 Type 2 diabetes mellitus with hyperglycemia: Secondary | ICD-10-CM | POA: Insufficient documentation

## 2019-08-26 LAB — CBC
HCT: 27.9 % — ABNORMAL LOW (ref 36.0–46.0)
Hemoglobin: 8.8 g/dL — ABNORMAL LOW (ref 12.0–15.0)
MCH: 26 pg (ref 26.0–34.0)
MCHC: 31.5 g/dL (ref 30.0–36.0)
MCV: 82.3 fL (ref 80.0–100.0)
Platelets: 279 10*3/uL (ref 150–400)
RBC: 3.39 MIL/uL — ABNORMAL LOW (ref 3.87–5.11)
RDW: 17.3 % — ABNORMAL HIGH (ref 11.5–15.5)
WBC: 10.1 10*3/uL (ref 4.0–10.5)
nRBC: 0 % (ref 0.0–0.2)

## 2019-08-26 LAB — TROPONIN I (HIGH SENSITIVITY)
Troponin I (High Sensitivity): 3 ng/L (ref <18)
Troponin I (High Sensitivity): 4 ng/L (ref <18)

## 2019-08-26 LAB — BASIC METABOLIC PANEL
Anion gap: 13 (ref 5–15)
BUN: 23 mg/dL — ABNORMAL HIGH (ref 6–20)
CO2: 19 mmol/L — ABNORMAL LOW (ref 22–32)
Calcium: 9.3 mg/dL (ref 8.9–10.3)
Chloride: 100 mmol/L (ref 98–111)
Creatinine, Ser: 1.03 mg/dL — ABNORMAL HIGH (ref 0.44–1.00)
GFR calc Af Amer: >60 mL/min (ref >60)
GFR calc non Af Amer: >60 mL/min (ref >60)
Glucose, Bld: 329 mg/dL — ABNORMAL HIGH (ref 70–99)
Potassium: 3.8 mmol/L (ref 3.5–5.1)
Sodium: 132 mmol/L — ABNORMAL LOW (ref 135–145)

## 2019-08-26 NOTE — ED Triage Notes (Signed)
Pt arrived via POV with reports of dizziness and lightheadedness x 2 months, pt states it has worsened today.   Pt has been seen by PCP told blood count was low at 9, pt has appt for iron transfusion on 8/30.  Pt also states while she was waiting for triage she got chest pain.  Pt states she started menstrual cycle on Friday states the dizziness worsened after that. Pt reports heavy menstrual cycles-pt does not see GYN for periods.

## 2019-08-26 NOTE — ED Notes (Signed)
Pt reports she is nauseated and states her chest pain is worse. Pt c/o chest pain 6/10 at this time.

## 2019-08-27 ENCOUNTER — Emergency Department
Admission: EM | Admit: 2019-08-27 | Discharge: 2019-08-27 | Disposition: A | Discharge status: Home / Self Care | Payer: Medicaid Other | Attending: Emergency Medicine | Admitting: Emergency Medicine

## 2019-08-27 DIAGNOSIS — R42 Dizziness and giddiness: Secondary | ICD-10-CM

## 2019-08-27 DIAGNOSIS — D509 Iron deficiency anemia, unspecified: Secondary | ICD-10-CM

## 2019-08-27 DIAGNOSIS — R739 Hyperglycemia, unspecified: Secondary | ICD-10-CM

## 2019-08-27 LAB — GLUCOSE, CAPILLARY: Glucose-Capillary: 83 mg/dL (ref 70–99)

## 2019-08-27 NOTE — ED Notes (Signed)
See triage note  Presents with some dizziness   States these sx's have been going on for about 2 months  Felt worse last pm  Dr Cyril Loosen in with pt on arrival

## 2019-08-27 NOTE — ED Provider Notes (Signed)
[facility] Emergency Department Provider Note   ____________________________________________    I have reviewed the triage vital signs and the nursing notes.   HISTORY  Chief Complaint Dizziness and Chest Pain     HPI Katelyn Barnes is a 39 y.o. female with a history of diabetes, iron deficiency anemia presents with complaints of weakness x6 weeks.  Patient reports ever since she was discharged from the hospital in June she has had continued weakness, intermittent lightheadedness.  She does have outpatient iron infusion scheduled, she reports she is also taking p.o. iron supplementation now.  Recently saw her PCP.  Had negative guaiac at that time.  No change in stools.  No abdominal pain.  Reports had brief episode of chest discomfort yesterday, now resolved.  No shortness of breath.  No cough.  Past Medical History:  Diagnosis Date  . DDD (degenerative disc disease), lumbar   . Diabetes mellitus without complication (HCC)    Type II  . DVT (deep venous thrombosis) (HCC) 2016   post back surgery - was on Eliquis 3 months  . Kidney stone     Patient Active Problem List   Diagnosis Date Noted  . Hyperglycemia   . Acute respiratory failure (HCC)   . Sepsis (HCC) 06/11/2019  . Acute renal failure (ARF) (HCC) 06/11/2019  . Pyelonephritis 06/11/2019  . DKA (diabetic ketoacidoses) (HCC) 06/11/2019  . Obesity hypoventilation syndrome (HCC) 06/11/2019  . Severe sepsis with acute organ dysfunction (HCC) 06/11/2019  . Radiculopathy 05/05/2016  . Morbid obesity with BMI of 40.0-44.9, adult (HCC) 06/27/2014  . 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 Diabetic neuropathy (HCC) 11/16/2012  . PCOS (polycystic ovarian syndrome) 11/16/2012  . Bilateral carpal tunnel syndrome 09/12/2012  . Right anterior knee pain 12/27/2011  . Diabetes mellitus type 2, uncontrolled (HCC) 06/10/2011  . Menorrhagia 06/10/2011  . Peripheral edema  06/10/2011  . Sciatica 04/27/2011  . Dysthymic disorder 08/12/2010  . Generalized anxiety disorder 08/12/2010    Past Surgical History:  Procedure Laterality Date  . ANTERIOR CERVICAL DECOMP/DISCECTOMY FUSION Right 05/05/2016   Procedure: ANTERIOR CERVICAL DECOMPRESSION FUSION, CERVICAL 7- THORACIC 1 WITH INSTRUMENTATION AND ALLOGRAFT;  Surgeon: Estill Bamberg, MD;  Location: [facility] OR;  Service: Orthopedics;  Laterality: Right;  ANTERIOR CERVICAL DECOMPRESSION FUSION, CERVICAL 7- THORACIC 1 WITH INSTRUMENTATION AND ALLOGRAFT  . BACK SURGERY  2016   discectomy   . CARPAL TUNNEL RELEASE Bilateral   . CYSTOSCOPY W/ RETROGRADES Left 07/06/2019   Procedure: CYSTOSCOPY WITH RETROGRADE PYELOGRAM;  Surgeon: [name], MD;  Location: [facility] ORS;  Service: Urology;  Laterality: Left;  . CYSTOSCOPY WITH URETEROSCOPY AND STENT PLACEMENT Left 06/11/2019   Procedure: CYSTOSCOPY WITH URETEROSCOPY AND STENT PLACEMENT;  Surgeon: Jerilee Field, MD;  Location: [facility] ORS;  Service: Urology;  Laterality: Left;  . CYSTOSCOPY WITH URETEROSCOPY AND STENT PLACEMENT Left 07/06/2019   Procedure: CYSTOSCOPY WITH URETEROSCOPY AND STENT exchange;  Surgeon: [name], MD;  Location: [facility] OR[facility];  Service: Urology;  Laterality: Left;  . SHOULDER ARTHROSCOPY    . TUBAL LIGATION  08/2007    Prior to Admission medications   Medication Sig Start Date End Date Taking? Authorizing Provider  atorvastatin (LIPITOR) 40 MG tablet Take by mouth. 07/10/19   [provider]  ferrous sulfate 325 (65 FE) MG tablet Take by mouth. 08/06/19 08/05/20  [provider]  insulin aspart protamine- aspart (NOVOLOG MIX 70/30) (70-30) 100 UNIT/ML injection Inject 40 Units into the skin 2 (  two) times daily with a meal.    [provider]  insulin isophane & regular human (HUMULIN 70/30 KWIKPEN) (70-30) 100 UNIT/ML KwikPen Inject into the skin. 07/10/19 07/09/20  [provider]  losartan (COZAAR) 50 MG tablet  Take by mouth. 07/10/19 07/09/20  [provider]  oxybutynin (DITROPAN) 5 MG tablet Take 1 tablet (5 mg total) by mouth 3 (three) times daily as needed for bladder spasms. 06/18/19   Carman Ching, PA-C  topiramate (TOPAMAX) 25 MG tablet Take by mouth. 07/10/19 07/09/20  [provider]     Allergies Penicillins  No family history on file.  Social History Social History   Tobacco Use  . Smoking status: Never Smoker  . Smokeless tobacco: Never Used  Vaping Use  . Vaping Use: Never used  Substance Use Topics  . Alcohol use: No  . Drug use: No    Review of Systems  Constitutional: No fevers, as above Eyes: No visual changes.  ENT: No sore throat. Cardiovascular: As above Respiratory: Denies shortness of breath. Gastrointestinal: No abdominal pain.  No change in stool Genitourinary: Negative for dysuria. Musculoskeletal: Negative for back pain. Skin: Negative for rash. Neurological: Negative for headaches   ____________________________________________   PHYSICAL EXAM:  VITAL SIGNS: ED Triage Vitals  Enc Vitals Group     BP 08/26/19 1706 117/67     Pulse Rate 08/26/19 1706 (!) 105     Resp 08/26/19 1706 18     Temp 08/26/19 1706 99.2 F (37.3 C)     Temp Source 08/26/19 1706 Oral     SpO2 08/26/19 1706 100 %     Weight 08/26/19 1707 97.1 kg (214 lb)     Height 08/26/19 1707 1.676 m (5\' 6" )     Head Circumference --      Peak Flow --      Pain Score 08/26/19 1710 3     Pain Loc --      Pain Edu? --      Excl. in GC? --     Constitutional: Alert and oriented.   Nose: No congestion/rhinnorhea. Mouth/Throat: Mucous membranes are moist.    Cardiovascular: Normal rate, regular rhythm. Grossly normal heart sounds.  Good peripheral circulation. Respiratory: Normal respiratory effort.  No retractions. Lungs CTAB. Gastrointestinal: Soft and nontender. No distention.   Musculoskeletal: No lower extremity tenderness nor edema.  Warm and well  perfused Neurologic:  Normal speech and language. No gross focal neurologic deficits are appreciated.  Skin:  Skin is warm, dry and intact. No rash noted. Psychiatric: Mood and affect are normal. Speech and behavior are normal.  ____________________________________________   LABS (all labs ordered are listed, but only abnormal results are displayed)  Labs Reviewed  BASIC METABOLIC PANEL - Abnormal; Notable for the following components:      Result Value   Sodium 132 (*)    CO2 19 (*)    Glucose, Bld 329 (*)    BUN 23 (*)    Creatinine, Ser 1.03 (*)    All other components within normal limits  CBC - Abnormal; Notable for the following components:   RBC 3.39 (*)    Hemoglobin 8.8 (*)    HCT 27.9 (*)    RDW 17.3 (*)    All other components within normal limits  GLUCOSE, CAPILLARY  POC URINE PREG, ED  TROPONIN I (HIGH SENSITIVITY)  TROPONIN I (HIGH SENSITIVITY)   ____________________________________________  EKG  ED ECG REPORT I, 08/28/19, the attending physician,  personally viewed and interpreted this ECG.  Date: 08/27/2019  Rhythm: Sinus tachycardia QRS Axis: normal Intervals: normal ST/T Wave abnormalities: normal Narrative Interpretation: no evidence of acute ischemia  ____________________________________________  RADIOLOGY  Chest x-ray reviewed by me, no infiltrate effusion or pneumothorax ____________________________________________   PROCEDURES  Procedure(s) performed: No  Procedures   Critical Care performed: No ____________________________________________   INITIAL IMPRESSION / ASSESSMENT AND PLAN / ED COURSE  Pertinent labs & imaging results that were available during my care of the patient were reviewed by me and considered in my medical decision making (see chart for details).  Patient presents with weakness as described x6 weeks.  Has a history of iron deficiency anemia.  Does report that she was menstruating this past weekend and is  concerned that her hemoglobin dropped significantly.  Hemoglobin is 8.8 today which is consistent with past levels, she reports it was 9.0 last week  Her cardiac enzymes are normal.  She is mildly dehydrated with mild elevation in glucose, this may be partially exacerbating her weakness.  Recommended increase p.o. hydration, no indication for transfusion at this time, has outpatient follow-up with infusion scheduled, return precautions discussed      ____________________________________________   FINAL CLINICAL IMPRESSION(S) / ED DIAGNOSES  Final diagnoses:  Lightheadedness  Iron deficiency anemia, unspecified iron deficiency anemia type  Hyperglycemia        Note:  This document was prepared using Dragon voice recognition software and may include unintentional dictation errors.   Jene Every, MD 08/27/19 1239

## 2019-09-05 ENCOUNTER — Ambulatory Visit
Admission: RE | Admit: 2019-09-05 | Discharge: 2019-09-05 | Disposition: A | Discharge status: Home / Self Care | Payer: Medicaid Other | Source: Ambulatory Visit | Attending: Urology | Admitting: Urology

## 2019-09-05 ENCOUNTER — Ambulatory Visit (INDEPENDENT_AMBULATORY_CARE_PROVIDER_SITE_OTHER): Payer: Self-pay | Admitting: Urology

## 2019-09-05 ENCOUNTER — Encounter: Payer: Self-pay | Admitting: Internal Medicine

## 2019-09-05 ENCOUNTER — Encounter: Payer: Self-pay | Admitting: Urology

## 2019-09-05 ENCOUNTER — Telehealth: Payer: Self-pay | Admitting: Internal Medicine

## 2019-09-05 ENCOUNTER — Inpatient Hospital Stay
Admission: AD | Admit: 2019-09-05 | Discharge: 2019-09-13 | DRG: 871 | Disposition: A | Discharge status: Home Health | Payer: Medicaid Other | Source: Ambulatory Visit | Attending: Family Medicine | Admitting: Family Medicine

## 2019-09-05 ENCOUNTER — Other Ambulatory Visit: Payer: Self-pay

## 2019-09-05 ENCOUNTER — Telehealth: Payer: Self-pay

## 2019-09-05 VITALS — BP 114/80 | HR 98 | Temp 98.6°F | Ht 66.0 in | Wt 206.0 lb

## 2019-09-05 DIAGNOSIS — Z1629 Resistance to other single specified antibiotic: Secondary | ICD-10-CM | POA: Diagnosis present

## 2019-09-05 DIAGNOSIS — Z87442 Personal history of urinary calculi: Secondary | ICD-10-CM | POA: Diagnosis not present

## 2019-09-05 DIAGNOSIS — Z881 Allergy status to other antibiotic agents status: Secondary | ICD-10-CM

## 2019-09-05 DIAGNOSIS — Z1611 Resistance to penicillins: Secondary | ICD-10-CM | POA: Diagnosis present

## 2019-09-05 DIAGNOSIS — E1169 Type 2 diabetes mellitus with other specified complication: Secondary | ICD-10-CM

## 2019-09-05 DIAGNOSIS — E119 Type 2 diabetes mellitus without complications: Secondary | ICD-10-CM | POA: Diagnosis not present

## 2019-09-05 DIAGNOSIS — K5641 Fecal impaction: Secondary | ICD-10-CM | POA: Diagnosis not present

## 2019-09-05 DIAGNOSIS — A4151 Sepsis due to Escherichia coli [E. coli]: Principal | ICD-10-CM | POA: Diagnosis present

## 2019-09-05 DIAGNOSIS — E611 Iron deficiency: Secondary | ICD-10-CM

## 2019-09-05 DIAGNOSIS — R109 Unspecified abdominal pain: Secondary | ICD-10-CM

## 2019-09-05 DIAGNOSIS — N12 Tubulo-interstitial nephritis, not specified as acute or chronic: Secondary | ICD-10-CM | POA: Diagnosis present

## 2019-09-05 DIAGNOSIS — N151 Renal and perinephric abscess: Secondary | ICD-10-CM | POA: Diagnosis present

## 2019-09-05 DIAGNOSIS — I959 Hypotension, unspecified: Secondary | ICD-10-CM | POA: Diagnosis present

## 2019-09-05 DIAGNOSIS — Z794 Long term (current) use of insulin: Secondary | ICD-10-CM

## 2019-09-05 DIAGNOSIS — E1165 Type 2 diabetes mellitus with hyperglycemia: Secondary | ICD-10-CM | POA: Diagnosis present

## 2019-09-05 DIAGNOSIS — B9620 Unspecified Escherichia coli [E. coli] as the cause of diseases classified elsewhere: Secondary | ICD-10-CM | POA: Diagnosis present

## 2019-09-05 DIAGNOSIS — R6520 Severe sepsis without septic shock: Secondary | ICD-10-CM | POA: Diagnosis not present

## 2019-09-05 DIAGNOSIS — I10 Essential (primary) hypertension: Secondary | ICD-10-CM | POA: Diagnosis present

## 2019-09-05 DIAGNOSIS — N179 Acute kidney failure, unspecified: Secondary | ICD-10-CM | POA: Diagnosis not present

## 2019-09-05 DIAGNOSIS — Z79899 Other long term (current) drug therapy: Secondary | ICD-10-CM

## 2019-09-05 DIAGNOSIS — Z880 Allergy status to penicillin: Secondary | ICD-10-CM | POA: Diagnosis not present

## 2019-09-05 DIAGNOSIS — Z86718 Personal history of other venous thrombosis and embolism: Secondary | ICD-10-CM | POA: Diagnosis not present

## 2019-09-05 DIAGNOSIS — R10A2 Flank pain, left side: Secondary | ICD-10-CM

## 2019-09-05 DIAGNOSIS — Z981 Arthrodesis status: Secondary | ICD-10-CM | POA: Diagnosis not present

## 2019-09-05 DIAGNOSIS — E785 Hyperlipidemia, unspecified: Secondary | ICD-10-CM | POA: Diagnosis present

## 2019-09-05 DIAGNOSIS — Z20822 Contact with and (suspected) exposure to covid-19: Secondary | ICD-10-CM | POA: Diagnosis present

## 2019-09-05 DIAGNOSIS — D638 Anemia in other chronic diseases classified elsewhere: Secondary | ICD-10-CM | POA: Diagnosis present

## 2019-09-05 LAB — LACTIC ACID, PLASMA: Lactic Acid, Venous: 1.9 mmol/L (ref 0.5–1.9)

## 2019-09-05 LAB — GLUCOSE, CAPILLARY
Glucose-Capillary: 227 mg/dL — ABNORMAL HIGH (ref 70–99)
Glucose-Capillary: 208 mg/dL — ABNORMAL HIGH (ref 70–99)

## 2019-09-05 LAB — SARS CORONAVIRUS 2 BY RT PCR (HOSPITAL ORDER, PERFORMED IN ~~LOC~~ HOSPITAL LAB): SARS Coronavirus 2: NEGATIVE

## 2019-09-05 MED ORDER — FERROUS SULFATE 325 (65 FE) MG PO TABS
325.0000 mg | ORAL_TABLET | Freq: Every day | ORAL | Status: DC
  Administered 2019-09-07 – 2019-09-13 (×7): 325 mg via ORAL
  Filled 2019-09-05 (×7): qty 1

## 2019-09-05 MED ORDER — INSULIN ASPART 100 UNIT/ML ~~LOC~~ SOLN
0.0000 [IU] | Freq: Three times a day (TID) | SUBCUTANEOUS | Status: DC
  Administered 2019-09-06 (×2): 2 [IU] via SUBCUTANEOUS
  Administered 2019-09-07: 1 [IU] via SUBCUTANEOUS
  Administered 2019-09-07 (×2): 2 [IU] via SUBCUTANEOUS
  Administered 2019-09-08: 3 [IU] via SUBCUTANEOUS
  Administered 2019-09-08 – 2019-09-09 (×4): 2 [IU] via SUBCUTANEOUS
  Administered 2019-09-09: 3 [IU] via SUBCUTANEOUS
  Administered 2019-09-10: 2 [IU] via SUBCUTANEOUS
  Administered 2019-09-10: 3 [IU] via SUBCUTANEOUS
  Administered 2019-09-10 – 2019-09-11 (×2): 2 [IU] via SUBCUTANEOUS
  Administered 2019-09-11: 3 [IU] via SUBCUTANEOUS
  Administered 2019-09-11 – 2019-09-12 (×3): 2 [IU] via SUBCUTANEOUS
  Administered 2019-09-12: 5 [IU] via SUBCUTANEOUS
  Administered 2019-09-13: 3 [IU] via SUBCUTANEOUS
  Administered 2019-09-13: 2 [IU] via SUBCUTANEOUS
  Filled 2019-09-05 (×22): qty 1

## 2019-09-05 MED ORDER — POLYETHYLENE GLYCOL 3350 17 G PO PACK: 17.0000 g | PACK | Freq: Every day | ORAL | Status: DC | PRN

## 2019-09-05 MED ORDER — TOPIRAMATE 100 MG PO TABS
100.0000 mg | ORAL_TABLET | Freq: Two times a day (BID) | ORAL | Status: DC
  Administered 2019-09-05 – 2019-09-13 (×16): 100 mg via ORAL
  Filled 2019-09-05 (×17): qty 1

## 2019-09-05 MED ORDER — ONDANSETRON HCL 4 MG/2ML IJ SOLN
4.0000 mg | Freq: Four times a day (QID) | INTRAMUSCULAR | Status: DC | PRN
  Administered 2019-09-06: 4 mg via INTRAVENOUS
  Filled 2019-09-05: qty 2

## 2019-09-05 MED ORDER — SODIUM CHLORIDE 0.9 % IV SOLN
2.0000 g | Freq: Three times a day (TID) | INTRAVENOUS | Status: DC
  Administered 2019-09-05 – 2019-09-07 (×5): 2 g via INTRAVENOUS
  Filled 2019-09-05 (×9): qty 2

## 2019-09-05 MED ORDER — ACETAMINOPHEN 325 MG PO TABS: 650.0000 mg | ORAL_TABLET | Freq: Four times a day (QID) | ORAL | Status: DC | PRN

## 2019-09-05 MED ORDER — ATORVASTATIN CALCIUM 20 MG PO TABS
40.0000 mg | ORAL_TABLET | Freq: Every day | ORAL | Status: DC
  Administered 2019-09-05 – 2019-09-12 (×8): 40 mg via ORAL
  Filled 2019-09-05 (×8): qty 2

## 2019-09-05 MED ORDER — INSULIN ASPART 100 UNIT/ML ~~LOC~~ SOLN
0.0000 [IU] | Freq: Every day | SUBCUTANEOUS | Status: DC
  Administered 2019-09-05: 2 [IU] via SUBCUTANEOUS
  Filled 2019-09-05: qty 1

## 2019-09-05 MED ORDER — LOSARTAN POTASSIUM 50 MG PO TABS
50.0000 mg | ORAL_TABLET | Freq: Every day | ORAL | Status: DC
  Administered 2019-09-05 – 2019-09-13 (×8): 50 mg via ORAL
  Filled 2019-09-05 (×8): qty 1

## 2019-09-05 MED ORDER — SODIUM CHLORIDE 0.9% FLUSH: 10.0000 mL | INTRAVENOUS | Status: DC | PRN

## 2019-09-05 MED ORDER — ACETAMINOPHEN 650 MG RE SUPP: 650.0000 mg | Freq: Four times a day (QID) | RECTAL | Status: DC | PRN

## 2019-09-05 MED ORDER — SODIUM CHLORIDE 0.9 % IV SOLN: INTRAVENOUS | Status: DC

## 2019-09-05 MED ORDER — HYDROCODONE-ACETAMINOPHEN 5-325 MG PO TABS
1.0000 | ORAL_TABLET | ORAL | Status: DC | PRN
  Administered 2019-09-05 – 2019-09-06 (×3): 1 via ORAL
  Administered 2019-09-06 – 2019-09-10 (×14): 2 via ORAL
  Administered 2019-09-10: 1 via ORAL
  Administered 2019-09-10 – 2019-09-11 (×2): 2 via ORAL
  Administered 2019-09-11 – 2019-09-13 (×7): 1 via ORAL
  Filled 2019-09-05: qty 2
  Filled 2019-09-05 (×2): qty 1
  Filled 2019-09-05: qty 2
  Filled 2019-09-05: qty 1
  Filled 2019-09-05: qty 2
  Filled 2019-09-05: qty 1
  Filled 2019-09-05 (×2): qty 2
  Filled 2019-09-05: qty 1
  Filled 2019-09-05 (×3): qty 2
  Filled 2019-09-05: qty 1
  Filled 2019-09-05 (×6): qty 2
  Filled 2019-09-05: qty 1
  Filled 2019-09-05 (×2): qty 2
  Filled 2019-09-05: qty 1
  Filled 2019-09-05: qty 2
  Filled 2019-09-05: qty 1

## 2019-09-05 MED ORDER — MORPHINE SULFATE (PF) 2 MG/ML IV SOLN
2.0000 mg | INTRAVENOUS | Status: DC | PRN
  Administered 2019-09-06 – 2019-09-07 (×2): 2 mg via INTRAVENOUS
  Filled 2019-09-05 (×2): qty 1

## 2019-09-05 MED ORDER — SENNA 8.6 MG PO TABS
1.0000 | ORAL_TABLET | Freq: Two times a day (BID) | ORAL | Status: DC
  Administered 2019-09-05 – 2019-09-07 (×3): 8.6 mg via ORAL
  Filled 2019-09-05 (×3): qty 1

## 2019-09-05 MED ORDER — ONDANSETRON HCL 4 MG PO TABS
4.0000 mg | ORAL_TABLET | Freq: Four times a day (QID) | ORAL | Status: DC | PRN
  Administered 2019-09-05 – 2019-09-07 (×2): 4 mg via ORAL
  Filled 2019-09-05 (×2): qty 1

## 2019-09-05 NOTE — Progress Notes (Signed)
09/05/2019 4:49 PM   Wilkie AyeKristy Gillermina PhyWilson Mccarrell 06/11/1980 [id number]  Referring provider: Cathie Hoopswens, Leanne Whaley, PA [address] [place],  [place] [id number]  Chief Complaint  Patient presents with  . Flank Pain    HPI: Katelyn Barnes is a 39 y.o. 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    CT 05/2019 Diffuse LEFT renal enlargement with gas along the superior aspect of the LEFT kidney, highly suspicious for diffuse infection and emphysematous pyelonephritis of the upper LEFT kidney. Moderate LEFT hydroureteronephrosis extending to near the bladder with equivocal slight transition change in the distal LEFT ureter 1 cm above the UVJ. No evidence of obstructing cause/urinary calculi. This hydroureteronephrosis may be caused by infection or possibly an obstructing process not visualized or passed calculus.  Cystoscopy/ureteroscopy with left retrograde and left ureteral stent placement on 06/11/2019 with Dr. 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   She underwent a second ureteroscopy with left retrograde and left ureteral stent placement on 07/06/2019 and finding were for left hydronephrosis.  The stent was placed on a tether and patient removed three days later.    RUS 08/14/2019 Diffuse echogenic shadowing at the left renal collecting system suspicious for gas within the collecting system, this limits evaluation for hydronephrosis. Consider correlation with CT.  Possible development of small complex fluid collection at the lower pole of left kidney. This could also be evaluated at CT follow-up.  Both kidneys are generous in size. There is no right hydronephrosis.  Urine culture positive for MUF.  She is scheduled for a repeat RUS in September.   Today, she states on Sunday she started to experience cold chills  associated with left lower quadrant pain and malodorous urine.  The pain then radiated to the right lower quadrant and into her right gluteus.  Today she states she has pain in her bilateral lower quadrants and bilateral gluteus.  She has extreme nausea and has been vomiting.  Today she states the pain is not as intense, 2 out of 10.  Patient denies any modifying or aggravating factors.  Patient denies any gross hematuria.  Patient denies any chills, nausea or vomiting.   She denies any vaginal discharge or vaginal bleeding.  She states she is not sexually active.  She states her last menstrual cycle was on August 24, 2019.  She has had a tubal ligation.  UA 11-30 WBC's, 3-10 RBC's and many bacteria.    STAT CT renal stone study notes left kidney with large amount of gas and fluid collection suspicious for abscess formation.  PMH: Past Medical History:  Diagnosis Date  . DDD (degenerative disc disease), lumbar   . Diabetes mellitus without complication (HCC)    Type II  . DVT (deep venous thrombosis) (HCC) 2016   post back surgery - was on Eliquis 3 months  . Kidney stone     Surgical History: Past Surgical History:  Procedure Laterality Date  . ANTERIOR CERVICAL DECOMP/DISCECTOMY FUSION Right 05/05/2016   Procedure: ANTERIOR CERVICAL DECOMPRESSION FUSION, CERVICAL 7- THORACIC 1 WITH INSTRUMENTATION AND ALLOGRAFT;  Surgeon: Estill BambergMark Dumonski, MD;  Location: [facility];  Service: Orthopedics;  Laterality: Right;  ANTERIOR CERVICAL DECOMPRESSION FUSION, CERVICAL 7- THORACIC 1 WITH INSTRUMENTATION AND ALLOGRAFT  . BACK SURGERY  2016   discectomy   . CARPAL TUNNEL RELEASE Bilateral   .  CYSTOSCOPY W/ RETROGRADES Left 07/06/2019   Procedure: CYSTOSCOPY WITH RETROGRADE PYELOGRAM;  Surgeon: [name], MD;  Location: [facility] ORS;  Service: Urology;  Laterality: Left;  . CYSTOSCOPY WITH URETEROSCOPY AND STENT PLACEMENT Left 06/11/2019   Procedure: CYSTOSCOPY WITH URETEROSCOPY AND STENT PLACEMENT;  Surgeon:  Jerilee Field, MD;  Location: [facility] ORS;  Service: Urology;  Laterality: Left;  . CYSTOSCOPY WITH URETEROSCOPY AND STENT PLACEMENT Left 07/06/2019   Procedure: CYSTOSCOPY WITH URETEROSCOPY AND STENT exchange;  Surgeon: [name], MD;  Location: [facility] OR[facility];  Service: Urology;  Laterality: Left;  . SHOULDER ARTHROSCOPY    . TUBAL LIGATION  08/2007    Home Medications:  Allergies as of 09/05/2019      Reactions   Penicillins Rash   14 years ago   Macrobid [nitrofurantoin] Nausea And Vomiting      Medication List       Accurate as of September 05, 2019  4:15 PM. If you have any questions, ask your nurse or doctor.        atorvastatin 40 MG tablet Commonly known as: LIPITOR Take by mouth.   ferrous sulfate 325 (65 FE) MG tablet Take by mouth.   HumuLIN 70/30 KwikPen (70-30) 100 UNIT/ML KwikPen Generic drug: insulin isophane & regular human Inject into the skin.   insulin aspart protamine- aspart (70-30) 100 UNIT/ML injection Commonly known as: NOVOLOG MIX 70/30 Inject 40 Units into the skin 2 (two) times daily with a meal.   losartan 50 MG tablet Commonly known as: COZAAR Take by mouth.   metFORMIN 500 MG tablet Commonly known as: GLUCOPHAGE Take 500 mg by mouth 2 (two) times daily.   oxybutynin 5 MG tablet Commonly known as: DITROPAN Take 1 tablet (5 mg total) by mouth 3 (three) times daily as needed for bladder spasms.   topiramate 25 MG tablet Commonly known as: TOPAMAX Take by mouth.       Allergies:  Allergies  Allergen Reactions  . Penicillins Rash    14 years ago   . Macrobid [Nitrofurantoin] Nausea And Vomiting    Family History: No family history on file.  Social History:  reports that she has never smoked. She has never used smokeless tobacco. She reports that she does not drink alcohol and does not use drugs.  ROS: Pertinent ROS in HPI  Physical Exam: BP 114/80 (BP Location: Left Arm, Patient Position: Sitting, Cuff Size: Large)    Pulse 98   Temp 98.6 F (37 C) (Oral)   Ht 5\' 6"  (1.676 m)   Wt 206 lb (93.4 kg)   LMP 08/24/2019 (Exact Date)   BMI 33.25 kg/m   Constitutional:  Well nourished. Alert and oriented, No acute distress. HEENT: [place] AT, mask in place.  Trachea midline Cardiovascular: No clubbing, cyanosis, or edema. Respiratory: Normal respiratory effort, no increased work of breathing. GI: Abdomen is soft, non tender, non distended, no abdominal masses.  GU: Left CVA tenderness.  No bladder fullness or masses.  Neurologic: Grossly intact, no focal deficits, moving all 4 extremities. Psychiatric: Normal mood and affect.   Laboratory Data: Lab Results  Component Value Date   WBC 10.1 08/26/2019   HGB 8.8 (L) 08/26/2019   HCT 27.9 (L) 08/26/2019   MCV 82.3 08/26/2019   PLT 279 08/26/2019    Lab Results  Component Value Date   CREATININE 1.03 (H) 08/26/2019    Lab Results  Component Value Date   HGBA1C 13.2 (H) 06/11/2019    Lab Results  Component Value Date   AST 36 06/13/2019   Lab Results  Component Value Date   ALT 36 06/13/2019    Urinalysis Component     Latest Ref Rng & Units 09/05/2019          Specific Gravity, UA     1.005 - 1.030 1.010  pH, UA     5.0 - 7.5 5.5  Color, UA     Yellow Yellow  Appearance Ur     Clear Cloudy (A)  Leukocytes,UA     Negative 1+ (A)  Protein,UA     Negative/Trace 1+ (A)  Glucose, UA     Negative Negative  Ketones, UA     Negative Negative  RBC, UA     Negative 2+ (A)  Bilirubin, UA     Negative Negative  Urobilinogen, Ur     0.2 - 1.0 mg/dL 1.0  Nitrite, UA     Negative Negative  Microscopic Examination      See below:   Component     Latest Ref Rng & Units 09/05/2019          WBC, UA     0 - 5 /hpf 11-30 (A)  RBC     3.87 - 5.11 MIL/uL 3-10 (A)  Epithelial Cells (non renal)     0 - 10 /hpf 0-10  Renal Epithel, UA     None seen /hpf 0-10 (A)  Bacteria, UA     None seen/Few Many (A)    I have reviewed the  labs.   Pertinent Imaging: CLINICAL DATA:  Left flank pain with nausea and chills for 3 days. History of left urinary tract obstruction with stent approximately 2-3 months ago.  EXAM: CT ABDOMEN AND PELVIS WITHOUT CONTRAST  TECHNIQUE: Multidetector CT imaging of the abdomen and pelvis was performed following the standard protocol without IV contrast.  COMPARISON:  08/14/2019 renal ultrasound.  Most recent CT 06/11/2019  FINDINGS: Lower chest: Clear lung bases. Normal heart size without pericardial or pleural effusion.  Hepatobiliary: Normal liver. Normal gallbladder, without biliary ductal dilatation.  Pancreas: Normal, without mass or ductal dilatation.  Spleen: Normal in size, without focal abnormality.  Adrenals/Urinary Tract: Normal right adrenal gland. The left adrenal is poorly evaluated. Normal right kidney, without stone, hydronephrosis. No bladder calculi.  Significant progression of left renal infection. Normal renal morphology is essentially replaced (small amount of retained parenchyma is suspected in the lower pole left kidney on 48/2) by a gas and fluid collection, most consistent with abscess. Example 9.8 x 10.5 cm on 35/2. 12.3 cm craniocaudal on 77 coronal. Displacement of the descending colon laterally and adjacent small bowel loops minimally anteriorly.  No hydroureter or ureteric stone.  Stomach/Bowel: Normal stomach, without wall thickening. The descending colon is intimately associated with the left renal process, without well-defined fistulous communication identified. Example 47/2. Normal terminal ileum and appendix. Normal small bowel.  Vascular/Lymphatic: Aortic atherosclerosis. Left periaortic 1.0 cm node on 39/2 is similar, likely reactive. No pelvic sidewall adenopathy.  Reproductive: Normal uterus and adnexa.  Tubal ligation.  Other: No significant free fluid. Mild pelvic floor laxity. No free intraperitoneal  air.  Musculoskeletal: Lumbar spondylosis.  IMPRESSION: 1. 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 2. Borderline abdominal adenopathy, likely reactive. 3.  Aortic Atherosclerosis (ICD10-I70.0).  This is age advanced.  These results will be called to the ordering  clinician or representative by the Radiologist Assistant, and communication documented in the PACS or [employer].   Electronically Signed   By: Jeronimo Greaves M.D.   On: 09/05/2019 14:53 I have independently reviewed the films.  See HPI.   Assessment & Plan:   1. 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 Celesta Gentile) for IV antibiotics and percutaneous drainage of abscess with interventional radiology Would recommend drain stay in place for at least one week and fluid sent for culture  Discussed with patient how the drain in placed and the risks of the procedure and the need to express the fluid from the abscess to clear the infection - she voiced her understanding and agrees with plan  Return for admit to the hospital .  These notes generated with voice recognition software. I apologize for typographical errors.  Michiel Cowboy, PA-C  [facility] [address]  [address] [place], [place] [address] [phone]

## 2019-09-05 NOTE — Consult Note (Signed)
Pharmacy Antibiotic Note  Katelyn Barnes is a 39 y.o. female admitted on 09/05/2019 with UTI.  Pharmacy has been consulted for cefepime dosing. Comes in withcold chills associated with left lower quadrant pain and malodorous urine, with nausea and vomiting. 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  Plan: Cefepime 2 g q8H     Temp (24hrs), Avg:98.6 F (37 C), Min:98.6 F (37 C), Max:98.6 F (37 C)  No results for input(s): WBC, CREATININE, LATICACIDVEN, VANCOTROUGH, VANCOPEAK, VANCORANDOM, GENTTROUGH, GENTPEAK, GENTRANDOM, TOBRATROUGH, TOBRAPEAK, TOBRARND, AMIKACINPEAK, AMIKACINTROU, AMIKACIN in the last 168 hours.  Estimated Creatinine Clearance: 83.6 mL/min (A) (by C-G formula based on SCr of 1.03 mg/dL (H)).    Allergies  Allergen Reactions  . Penicillins Rash    14 years ago   . Macrobid [Nitrofurantoin] Nausea And Vomiting    Antimicrobials this admission: 8/25 cefepime >>   Dose adjustments this admission: None  Microbiology results: None  Thank you for allowing pharmacy to be a part of this patient's care.  Ronnald Ramp, PharmD, BCPS 09/05/2019 5:12 PM

## 2019-09-05 NOTE — H&P (Signed)
[facility] [place] [facility]   PATIENT NAME: Katelyn Barnes    MR#:  [id number]  DATE OF BIRTH:  11-10-1980  DATE OF ADMISSION:  09/05/2019  PRIMARY CARE PHYSICIAN: Cathie Hoops, PA   REQUESTING/REFERRING PHYSICIAN: Fenton Malling, PA Urology  Patient coming from : [facility] urology office   CHIEF COMPLAINT:  left flank pain, nausea and fever with chills on Monday  HISTORY OF PRESENT ILLNESS:  Katelyn Barnes  is a 39 y.o. 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 Patient's last stent was placed on 25 June.  She was seen in the urology office after she started having left flank pain had fever with chills on Monday night.  CT renal study was done and showed1. 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 2. Borderline abdominal adenopathy, likely reactive. 3.  Aortic Atherosclerosis (ICD10-I70.0).  This is age advanced  Patient was seen as a direct admit from urology office for IV antibiotics and IR to place CT guided drain. Patient is currently afebrile, blood pressure soft. Routine labs pending. COVID pending   PAST MEDICAL HISTORY:   Past Medical History:  Diagnosis Date  . DDD (degenerative disc disease), lumbar   . Diabetes mellitus without complication (HCC)    Type II  . DVT (deep venous thrombosis) (HCC) 2016   post back surgery - was on Eliquis 3 months  . Kidney stone     PAST SURGICAL HISTOIRY:   Past Surgical History:  Procedure Laterality Date  . ANTERIOR CERVICAL DECOMP/DISCECTOMY FUSION Right 05/05/2016   Procedure: ANTERIOR CERVICAL DECOMPRESSION FUSION, CERVICAL 7- THORACIC 1 WITH INSTRUMENTATION AND ALLOGRAFT;  Surgeon: Estill Bamberg,  MD;  Location: [facility] OR;  Service: Orthopedics;  Laterality: Right;  ANTERIOR CERVICAL DECOMPRESSION FUSION, CERVICAL 7- THORACIC 1 WITH INSTRUMENTATION AND ALLOGRAFT  . BACK SURGERY  2016   discectomy   . CARPAL TUNNEL RELEASE Bilateral   . CYSTOSCOPY W/ RETROGRADES Left 07/06/2019   Procedure: CYSTOSCOPY WITH RETROGRADE PYELOGRAM;  Surgeon: [name], MD;  Location: [facility] OR[facility];  Service: Urology;  Laterality: Left;  . CYSTOSCOPY WITH URETEROSCOPY AND STENT PLACEMENT Left 06/11/2019   Procedure: CYSTOSCOPY WITH URETEROSCOPY AND STENT PLACEMENT;  Surgeon: Jerilee Field, MD;  Location: [facility] OR[facility];  Service: Urology;  Laterality: Left;  . CYSTOSCOPY WITH URETEROSCOPY AND STENT PLACEMENT Left 07/06/2019   Procedure: CYSTOSCOPY WITH URETEROSCOPY AND STENT exchange;  Surgeon: [name], MD;  Location: [facility] ORS;  Service: Urology;  Laterality: Left;  . SHOULDER ARTHROSCOPY    . TUBAL LIGATION  08/2007    SOCIAL HISTORY:   Social History   Tobacco Use  . Smoking status: Never Smoker  . Smokeless tobacco: Never Used  Substance Use Topics  . Alcohol use: No    FAMILY HISTORY:  History reviewed. No pertinent family history.  DRUG ALLERGIES:   Allergies  Allergen Reactions  . Penicillins Rash    14 years ago   . Macrobid [Nitrofurantoin] Nausea And Vomiting    REVIEW OF SYSTEMS:  Review of Systems  Constitutional: Positive for chills, fever and malaise/fatigue. Negative for weight loss.  HENT: Negative for ear discharge, ear pain and nosebleeds.   Eyes: Negative for blurred vision, pain and discharge.  Respiratory: Negative for sputum production, shortness of breath,  wheezing and stridor.   Cardiovascular: Negative for chest pain, palpitations, orthopnea and PND.  Gastrointestinal: Negative for abdominal pain, diarrhea, nausea and vomiting.  Genitourinary: Positive for flank pain. Negative for frequency and urgency.  Musculoskeletal: Negative for back pain and joint pain.   Neurological: Positive for weakness. Negative for sensory change, speech change and focal weakness.  Psychiatric/Behavioral: Negative for depression and hallucinations. The patient is not nervous/anxious.      MEDICATIONS AT HOME:   Prior to Admission medications   Medication Sig Start Date End Date Taking? Authorizing Provider  atorvastatin (LIPITOR) 40 MG tablet Take by mouth. 07/10/19  Yes [provider]  ferrous sulfate 325 (65 FE) MG tablet Take by mouth. 08/06/19 08/05/20 Yes [provider]  losartan (COZAAR) 50 MG tablet Take by mouth. 07/10/19 07/09/20 Yes [provider]  insulin aspart protamine- aspart (NOVOLOG MIX 70/30) (70-30) 100 UNIT/ML injection Inject 40 Units into the skin 2 (two) times daily with a meal.    [provider]  insulin isophane & regular human (HUMULIN 70/30 KWIKPEN) (70-30) 100 UNIT/ML KwikPen Inject into the skin. 07/10/19 07/09/20  [provider]  metFORMIN (GLUCOPHAGE) 500 MG tablet Take 500 mg by mouth 2 (two) times daily. 08/04/19   [provider]  oxybutynin (DITROPAN) 5 MG tablet Take 1 tablet (5 mg total) by mouth 3 (three) times daily as needed for bladder spasms. 06/18/19   Carman Ching, PA-C  topiramate (TOPAMAX) 25 MG tablet Take by mouth. 07/10/19 07/09/20  [provider]      VITAL SIGNS:  Last menstrual period 08/24/2019.  PHYSICAL EXAMINATION:  GENERAL:  39 y.o.-year-old patient lying in the bed with no acute distress. obese EYES: Pupils equal, round, reactive to light and accommodation. No scleral icterus.  HEENT: Head atraumatic, normocephalic. Oropharynx and nasopharynx clear.  NECK:  Supple, no jugular venous distention. No thyroid enlargement, no tenderness.  LUNGS: Normal breath sounds bilaterally, no wheezing, rales,rhonchi or crepitation. No use of accessory muscles of respiration.  CARDIOVASCULAR: S1, S2 normal. No murmurs, rubs, or gallops.  ABDOMEN: Soft, left  flank tender, nondistended. Bowel sounds present. No organomegaly or mass.  EXTREMITIES: No pedal edema, cyanosis, or clubbing.  NEUROLOGIC: Cranial nerves II through XII are intact. Muscle strength 5/5 in all extremities. Sensation intact. Gait not checked.  PSYCHIATRIC: The patient is alert and oriented x 3.  SKIN: No obvious rash, lesion, or ulcer.   LABORATORY PANEL:   CBC No results for input(s): WBC, HGB, HCT, PLT in the last 168 hours. ------------------------------------------------------------------------------------------------------------------  Chemistries  No results for input(s): NA, K, CL, CO2, GLUCOSE, BUN, CREATININE, CALCIUM, MG, AST, ALT, ALKPHOS, BILITOT in the last 168 hours.  Invalid input(s): GFRCGP ------------------------------------------------------------------------------------------------------------------  Cardiac Enzymes No results for input(s): TROPONINI in the last 168 hours. ------------------------------------------------------------------------------------------------------------------  RADIOLOGY:  CT RENAL STONE STUDY  Result Date: 09/05/2019 CLINICAL DATA:  Left flank pain with nausea and chills for 3 days. History of left urinary tract obstruction with stent approximately 2-3 months ago. EXAM: CT ABDOMEN AND PELVIS WITHOUT CONTRAST TECHNIQUE: Multidetector CT imaging of the abdomen and pelvis was performed following the standard protocol without IV contrast. COMPARISON:  08/14/2019 renal ultrasound.  Most recent CT 06/11/2019 FINDINGS: Lower chest: Clear lung bases. Normal heart size without pericardial or pleural effusion. Hepatobiliary: Normal liver. Normal gallbladder, without biliary ductal dilatation. Pancreas: Normal, without mass or ductal dilatation. Spleen: Normal in size, without focal abnormality. Adrenals/Urinary Tract: Normal right adrenal gland. The left adrenal is poorly evaluated.  Normal right kidney, without stone, hydronephrosis. No  bladder calculi. Significant progression of left renal infection. Normal renal morphology is essentially replaced (small amount of retained parenchyma is suspected in the lower pole left kidney on 48/2) by a gas and fluid collection, most consistent with abscess. Example 9.8 x 10.5 cm on 35/2. 12.3 cm craniocaudal on 77 coronal. Displacement of the descending colon laterally and adjacent small bowel loops minimally anteriorly. No hydroureter or ureteric stone. Stomach/Bowel: Normal stomach, without wall thickening. The descending colon is intimately associated with the left renal process, without well-defined fistulous communication identified. Example 47/2. Normal terminal ileum and appendix. Normal small bowel. Vascular/Lymphatic: Aortic atherosclerosis. Left periaortic 1.0 cm node on 39/2 is similar, likely reactive. No pelvic sidewall adenopathy. Reproductive: Normal uterus and adnexa.  Tubal ligation. Other: No significant free fluid. Mild pelvic floor laxity. No free intraperitoneal air. Musculoskeletal: Lumbar spondylosis. IMPRESSION: 1. 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 2. Borderline abdominal adenopathy, likely reactive. 3.  Aortic Atherosclerosis (ICD10-I70.0).  This is age advanced. These results will be called to the ordering clinician or representative by the Radiologist Assistant, and communication documented in the PACS or [employer]. Electronically Signed   By: Jeronimo Greaves M.D.   On: 09/05/2019 14:53    EKG:    IMPRESSION AND PLAN:  Katelyn Barnes  is a 39 y.o. 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 Patient's last stent was placed on 25 June. She was seen in the urology office after she  started having left flank pain had fever with chills on Monday night.   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 coli in May 2021 -discussed with IR Dr. Miles Costain-- recommends CT guided drain which will be done tomorrow. -Fluid lab requested-- culture and white count with differential -urinalysis -IV and PO pain meds -urology consultation-- Dr. Lonna Cobb informed  Type II diabetes, hyperglycemia, insulin requiring -sliding scale insulin for now -Home meds insulin 70/30 and metformin  Chronic anemia continue iron pills  Hyperlipidemia on atorvastatin  relative hypotension history of hypertension -patient takes losartan 50 mg daily-- holding today  DVT prophylaxis  SCD pending CT guided drain  Family Communication : husband in the room Consults : urology, IR Code Status : full DVT prophylaxis : SCD admission status: inpatient  TOTAL TIME TAKING CARE OF THIS PATIENT: *50* minutes.    Enedina Finner M.D  [facility]     CC: Primary care physician; Cathie Hoops, [place]

## 2019-09-05 NOTE — Telephone Encounter (Signed)
[facility] TELEPHONE ENCOUNTER NOTE  Patient: Katelyn Barnes EYE:[id number]   PCP: Cathie Hoops, [place] DOB: 04-Mar-1980   DOS: 09/05/2019     Received a phone call from Facility: Urology clinic regarding transfer of Ms. Katelyn Barnes. Requesting: Harle Battiest, PA-C Reason for transfer: direct admission  History: h/o emphysematous pyelitis in may 2021. Comes in with cold chills associated with left lower quadrant pain and malodorous urine, with nausea and vomiting. 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  Vitals: stable  Plan of care: The patient will be accepted for admission to [facility], medsurg unit. Pt will need IR consultation on arrival for drain placement tomorrow.  Urology will follow the pt.  Prior infection with E coli Susceptibility   Escherichia coli    MIC    AMPICILLIN >=32 RESIST... Resistant    AMPICILLIN/SULBACTAM >=32 RESIST... Resistant    CEFAZOLIN >=64 RESIST... Resistant    CEFEPIME <=1 SENSITIVE  Sensitive    CEFTAZIDIME <=1 SENSITIVE  Sensitive    CEFTRIAXONE <=1 SENSITIVE  Sensitive    CIPROFLOXACIN <=0.25 SENS... Sensitive    GENTAMICIN <=1 SENSITIVE  Sensitive    IMIPENEM <=0.25 SENS... Sensitive    PIP/TAZO >=128 RESIS... Resistant    TRIMETH/SULFA >=320 RESIS... Resistant    Author: Lynden Oxford, MD [facility] 09/05/2019

## 2019-09-05 NOTE — Telephone Encounter (Signed)
Patient called today stating that she has been having flank pain possible fever, chills, and nausea with bad odor in her urine. Patient is taking NSAIDs for pain and is unsure what her tempeture is at this time. With history of Hydronephrosis she was added on to PA schedule today for further evaluation.

## 2019-09-05 NOTE — Progress Notes (Deleted)
Order for Midline received. Secure chat wiith SWOT RN, [facility] to try PIV.

## 2019-09-06 ENCOUNTER — Inpatient Hospital Stay: Payer: Medicaid Other

## 2019-09-06 DIAGNOSIS — N151 Renal and perinephric abscess: Secondary | ICD-10-CM

## 2019-09-06 LAB — CBC WITH DIFFERENTIAL/PLATELET
Abs Immature Granulocytes: 0.08 10*3/uL — ABNORMAL HIGH (ref 0.00–0.07)
Basophils Absolute: 0 10*3/uL (ref 0.0–0.1)
Basophils Relative: 0 %
Eosinophils Absolute: 0.1 10*3/uL (ref 0.0–0.5)
Eosinophils Relative: 1 %
HCT: 28.1 % — ABNORMAL LOW (ref 36.0–46.0)
Hemoglobin: 8.8 g/dL — ABNORMAL LOW (ref 12.0–15.0)
Immature Granulocytes: 1 %
Lymphocytes Relative: 17 %
Lymphs Abs: 2 10*3/uL (ref 0.7–4.0)
MCH: 25.8 pg — ABNORMAL LOW (ref 26.0–34.0)
MCHC: 31.3 g/dL (ref 30.0–36.0)
MCV: 82.4 fL (ref 80.0–100.0)
Monocytes Absolute: 0.9 10*3/uL (ref 0.1–1.0)
Monocytes Relative: 7 %
Neutro Abs: 8.8 10*3/uL — ABNORMAL HIGH (ref 1.7–7.7)
Neutrophils Relative %: 74 %
Platelets: 310 10*3/uL (ref 150–400)
RBC: 3.41 MIL/uL — ABNORMAL LOW (ref 3.87–5.11)
RDW: 16.5 % — ABNORMAL HIGH (ref 11.5–15.5)
WBC: 11.8 10*3/uL — ABNORMAL HIGH (ref 4.0–10.5)
nRBC: 0 % (ref 0.0–0.2)

## 2019-09-06 LAB — URINALYSIS, COMPLETE
Bilirubin, UA: NEGATIVE
Glucose, UA: NEGATIVE
Ketones, UA: NEGATIVE
Nitrite, UA: NEGATIVE
Specific Gravity, UA: 1.01 (ref 1.005–1.030)
Urobilinogen, Ur: 1 mg/dL (ref 0.2–1.0)
pH, UA: 5.5 (ref 5.0–7.5)

## 2019-09-06 LAB — COMPREHENSIVE METABOLIC PANEL
ALT: 12 U/L (ref 0–44)
AST: 12 U/L — ABNORMAL LOW (ref 15–41)
Albumin: 2.9 g/dL — ABNORMAL LOW (ref 3.5–5.0)
Alkaline Phosphatase: 63 U/L (ref 38–126)
Anion gap: 12 (ref 5–15)
BUN: 15 mg/dL (ref 6–20)
CO2: 20 mmol/L — ABNORMAL LOW (ref 22–32)
Calcium: 8.9 mg/dL (ref 8.9–10.3)
Chloride: 103 mmol/L (ref 98–111)
Creatinine, Ser: 0.9 mg/dL (ref 0.44–1.00)
GFR calc Af Amer: >60 mL/min (ref >60)
GFR calc non Af Amer: >60 mL/min (ref >60)
Glucose, Bld: 210 mg/dL — ABNORMAL HIGH (ref 70–99)
Potassium: 3.5 mmol/L (ref 3.5–5.1)
Sodium: 135 mmol/L (ref 135–145)
Total Bilirubin: 1.3 mg/dL — ABNORMAL HIGH (ref 0.3–1.2)
Total Protein: 8.2 g/dL — ABNORMAL HIGH (ref 6.5–8.1)

## 2019-09-06 LAB — GLUCOSE, CAPILLARY
Glucose-Capillary: 191 mg/dL — ABNORMAL HIGH (ref 70–99)
Glucose-Capillary: 192 mg/dL — ABNORMAL HIGH (ref 70–99)

## 2019-09-06 LAB — HEMOGLOBIN A1C
Hgb A1c MFr Bld: 7.5 % — ABNORMAL HIGH (ref 4.8–5.6)
Mean Plasma Glucose: 168.55 mg/dL

## 2019-09-06 LAB — MICROSCOPIC EXAMINATION

## 2019-09-06 LAB — PROTIME-INR
INR: 1.3 — ABNORMAL HIGH (ref 0.8–1.2)
Prothrombin Time: 15.2 seconds (ref 11.4–15.2)

## 2019-09-06 LAB — TYPE AND SCREEN
ABO/RH(D): A POS
Antibody Screen: NEGATIVE

## 2019-09-06 LAB — MAGNESIUM: Magnesium: 1.5 mg/dL — ABNORMAL LOW (ref 1.7–2.4)

## 2019-09-06 MED ORDER — ENSURE MAX PROTEIN PO LIQD
11.0000 [oz_av] | Freq: Two times a day (BID) | ORAL | Status: DC
  Administered 2019-09-07 – 2019-09-13 (×12): 11 [oz_av] via ORAL
  Filled 2019-09-06: qty 330

## 2019-09-06 MED ORDER — FENTANYL CITRATE (PF) 100 MCG/2ML IJ SOLN
INTRAMUSCULAR | Status: AC | PRN
  Administered 2019-09-06: 50 ug via INTRAVENOUS

## 2019-09-06 MED ORDER — ADULT MULTIVITAMIN W/MINERALS CH
1.0000 | ORAL_TABLET | Freq: Every day | ORAL | Status: DC
  Administered 2019-09-07 – 2019-09-13 (×7): 1 via ORAL
  Filled 2019-09-06 (×7): qty 1

## 2019-09-06 MED ORDER — FENTANYL CITRATE (PF) 100 MCG/2ML IJ SOLN
INTRAMUSCULAR | Status: AC
  Filled 2019-09-06: qty 2

## 2019-09-06 MED ORDER — SODIUM CHLORIDE 0.9% FLUSH
5.0000 mL | Freq: Three times a day (TID) | INTRAVENOUS | Status: DC
  Administered 2019-09-06 – 2019-09-13 (×21): 5 mL

## 2019-09-06 MED ORDER — MIDAZOLAM HCL 2 MG/2ML IJ SOLN
INTRAMUSCULAR | Status: AC | PRN
  Administered 2019-09-06: 1 mg via INTRAVENOUS

## 2019-09-06 MED ORDER — MIDAZOLAM HCL 2 MG/2ML IJ SOLN
INTRAMUSCULAR | Status: AC
  Filled 2019-09-06: qty 2

## 2019-09-06 NOTE — Progress Notes (Signed)
Urology Inpatient Progress Note  Subjective: Afebrile, VSS. WBC count 11.8.  Creatinine 0.90. Urine culture pending, on antibiotics as below. Patient reports improvement in nausea since yesterday.  She is concerned about long-term renal damage from her recurrent left renal infections.  Anti-infectives: Anti-infectives (From admission, onward)   Start     Dose/Rate Route Frequency Ordered Stop   09/05/19 1800  ceFEPIme (MAXIPIME) 2 g in sodium chloride 0.9 % 100 mL IVPB        2 g 200 mL/hr over 30 Minutes Intravenous Every 8 hours 09/05/19 1652        Current Facility-Administered Medications  Medication Dose Route Frequency Provider Last Rate Last Admin  . 0.9 %  sodium chloride infusion   Intravenous Continuous Enedina Finner, MD 75 mL/hr at 09/06/19 0551 New Bag at 09/06/19 0551  . acetaminophen (TYLENOL) tablet 650 mg  650 mg Oral Q6H PRN Enedina Finner, MD       Or  . acetaminophen (TYLENOL) suppository 650 mg  650 mg Rectal Q6H PRN Enedina Finner, MD      . atorvastatin (LIPITOR) tablet 40 mg  40 mg Oral Daily Enedina Finner, MD   40 mg at 09/05/19 2159  . ceFEPIme (MAXIPIME) 2 g in sodium chloride 0.9 % 100 mL IVPB  2 g Intravenous Q8H Enedina Finner, MD 200 mL/hr at 09/06/19 0552 2 g at 09/06/19 0552  . ferrous sulfate tablet 325 mg  325 mg Oral Q breakfast Enedina Finner, MD      . HYDROcodone-acetaminophen (NORCO/VICODIN) 5-325 MG per tablet 1-2 tablet  1-2 tablet Oral Q4H PRN Enedina Finner, MD   1 tablet at 09/06/19 0435  . insulin aspart (novoLOG) injection 0-5 Units  0-5 Units Subcutaneous QHS Enedina Finner, MD   2 Units at 09/05/19 2200  . insulin aspart (novoLOG) injection 0-9 Units  0-9 Units Subcutaneous TID WC Enedina Finner, MD   2 Units at 09/06/19 [phone]  . losartan (COZAAR) tablet 50 mg  50 mg Oral Daily Enedina Finner, MD   50 mg at 09/05/19 1820  . morphine 2 MG/ML injection 2 mg  2 mg Intravenous Q4H PRN Enedina Finner, MD      . ondansetron [facility]) tablet 4 mg  4 mg Oral Q6H PRN Enedina Finner,  MD   4 mg at 09/05/19 1820   Or  . ondansetron (ZOFRAN) injection 4 mg  4 mg Intravenous Q6H PRN Enedina Finner, MD      . polyethylene glycol (MIRALAX / GLYCOLAX) packet 17 g  17 g Oral Daily PRN Enedina Finner, MD      . senna (SENOKOT) tablet 8.6 mg  1 tablet Oral BID Enedina Finner, MD   8.6 mg at 09/05/19 2201  . sodium chloride flush (NS) 0.9 % injection 10-40 mL  10-40 mL Intracatheter PRN Enedina Finner, MD      . topiramate (TOPAMAX) tablet 100 mg  100 mg Oral BID Enedina Finner, MD   100 mg at 09/05/19 2201   Objective: Vital signs in last 24 hours: Temp:  [98.1 F (36.7 C)-100.3 F (37.9 C)] 98.1 F (36.7 C) (08/26 0815) Pulse Rate:  [84-101] 84 (08/26 0815) Resp:  [17-21] 17 (08/26 0815) BP: (114-124)/(72-80) 124/75 (08/26 0815) SpO2:  [97 %-100 %] 100 % (08/26 0815) Weight:  [93.4 kg] 93.4 kg (08/25 1257)  Intake/Output from previous day: 08/25 0701 - 08/26 0700 In: 765.9 [I.V.:665.9; IV Piggyback:100] Out: -  Intake/Output this shift: No intake/output data recorded.  Physical Exam Vitals  and nursing note reviewed.  Constitutional:      General: She is not in acute distress.    Appearance: She is not ill-appearing, toxic-appearing or diaphoretic.  HENT:     Head: Normocephalic and atraumatic.  Pulmonary:     Effort: Pulmonary effort is normal. No respiratory distress.  Skin:    General: Skin is warm and dry.  Neurological:     Mental Status: She is alert and oriented to person, place, and time.  Psychiatric:        Mood and Affect: Mood normal.        Behavior: Behavior normal.    Lab Results:  Recent Labs    09/06/19 0844  WBC 11.8*  HGB 8.8*  HCT 28.1*  PLT 310   BMET Recent Labs    09/06/19 0844  NA 135  K 3.5  CL 103  CO2 20*  GLUCOSE 210*  BUN 15  CREATININE 0.90  CALCIUM 8.9   PT/INR Recent Labs    09/06/19 0844  LABPROT 15.2  INR 1.3*   Studies/Results: CT RENAL STONE STUDY  Result Date: 09/05/2019 CLINICAL DATA:  Left flank pain with  nausea and chills for 3 days. History of left urinary tract obstruction with stent approximately 2-3 months ago. EXAM: CT ABDOMEN AND PELVIS WITHOUT CONTRAST TECHNIQUE: Multidetector CT imaging of the abdomen and pelvis was performed following the standard protocol without IV contrast. COMPARISON:  08/14/2019 renal ultrasound.  Most recent CT 06/11/2019 FINDINGS: Lower chest: Clear lung bases. Normal heart size without pericardial or pleural effusion. Hepatobiliary: Normal liver. Normal gallbladder, without biliary ductal dilatation. Pancreas: Normal, without mass or ductal dilatation. Spleen: Normal in size, without focal abnormality. Adrenals/Urinary Tract: Normal right adrenal gland. The left adrenal is poorly evaluated. Normal right kidney, without stone, hydronephrosis. No bladder calculi. Significant progression of left renal infection. Normal renal morphology is essentially replaced (small amount of retained parenchyma is suspected in the lower pole left kidney on 48/2) by a gas and fluid collection, most consistent with abscess. Example 9.8 x 10.5 cm on 35/2. 12.3 cm craniocaudal on 77 coronal. Displacement of the descending colon laterally and adjacent small bowel loops minimally anteriorly. No hydroureter or ureteric stone. Stomach/Bowel: Normal stomach, without wall thickening. The descending colon is intimately associated with the left renal process, without well-defined fistulous communication identified. Example 47/2. Normal terminal ileum and appendix. Normal small bowel. Vascular/Lymphatic: Aortic atherosclerosis. Left periaortic 1.0 cm node on 39/2 is similar, likely reactive. No pelvic sidewall adenopathy. Reproductive: Normal uterus and adnexa.  Tubal ligation. Other: No significant free fluid. Mild pelvic floor laxity. No free intraperitoneal air. Musculoskeletal: Lumbar spondylosis. IMPRESSION: 1. 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 2. Borderline abdominal adenopathy, likely reactive. 3.  Aortic Atherosclerosis (ICD10-I70.0).  This is age advanced. These results will be called to the ordering clinician or representative by the Radiologist Assistant, and communication documented in the PACS or [employer]. Electronically Signed   By: Jeronimo Greaves M.D.   On: 09/05/2019 14:53   Assessment & Plan: 39 year old female with PMH diabetes admitted with progressing left renal abscess presumably secondary to emphysematous pyelonephritis, on broad-spectrum IV antibiotics with urine culture pending.  Planning for left nephrostomy tube placement with IR today for source control of her infection.  I explained to the patient today that it is difficult at this point to determine any long-term  impacts on her left renal function.  I explained that we will continue to monitor her through her recovery and that this remains to be determined.  I reminded her that her right kidney continues to function well.  She expressed understanding.  Carman Ching, PA-C 09/06/2019

## 2019-09-06 NOTE — Progress Notes (Signed)
Initial Nutrition Assessment  DOCUMENTATION CODES:   Obesity unspecified  INTERVENTION:   Ensure Max protein supplement BID, each supplement provides 150kcal and 30g of protein.  MVI daily   NUTRITION DIAGNOSIS:   Inadequate oral intake related to acute illness as evidenced by per patient/family report.  GOAL:   Patient will meet greater than or equal to 90% of their needs  MONITOR:   PO intake, Supplement acceptance, Labs, Weight trends, Skin, I & O's  REASON FOR ASSESSMENT:   Malnutrition Screening Tool    ASSESSMENT:   39 year old female with PMH diabetes, anemia and HTN admitted with progressing left renal abscess presumably secondary to emphysematous pyelonephritis  RD working remotely.  Unable to reach pt by phone. Per chart review, pt reports poor appetite, nausea and vomiting that that started on Sunday pta. Pt currently NPO today for IR drain placement. Per chart, pt appears to be down 29lbs(12%) over the past 2 months; this is significant. RD will add supplements and MVI to help pt meet her estimated needs. RD will obtain nutrition related history and exam at follow-up.   Medications reviewed and include: ferrous sulfate, insulin, senokot, NaCl @75ml /hr, cefepime  Labs reviewed: Mg 1.5(L) Wbc- 11.8(H), Hgb 8.8(L), Hct 28.1(L) cbgs- 227, 208, 191 x 24 hrs AIC 7.5(H)- 8/25  NUTRITION - FOCUSED PHYSICAL EXAM: Unable to peform at this time   Diet Order:   Diet Order            Diet NPO time specified Except for: Ice Chips, Sips with Meds  Diet effective now                EDUCATION NEEDS:   No education needs have been identified at this time  Skin:  Skin Assessment: Reviewed RN Assessment  Last BM:  pta  Height:   Ht Readings from Last 1 Encounters:  09/05/19 5\' 6"  (1.676 m)    Weight:   Wt Readings from Last 1 Encounters:  09/05/19 93.4 kg    Ideal Body Weight:  59 kg  BMI:  There is no height or weight on file to calculate  BMI.  Estimated Nutritional Needs:   Kcal:  1900-2200kcal/day  Protein:  95-105g/day  Fluid:  >1.8L/day  MS, RD, LDN Please refer to [facility] for RD and/or RD on-call/weekend/after hours pager

## 2019-09-06 NOTE — Progress Notes (Addendum)
Per Lab tech body fluid cell count is unable to process due to cells are degenerating.

## 2019-09-06 NOTE — Procedures (Signed)
Interventional Radiology Procedure:   Indications: Left renal abscess  Procedure: CT guided abscess drain placement  Findings: 12 Fr drain placed and 320 ml of yellow purulent fluid removed.  Complications: None     EBL: less than 10 ml  Plan: Send fluid for culture and follow output.  Recommend CT A/P with IV contrast when output decreases.     Jerusalem Brownstein R. Lowella Dandy, MD  Pager: [phone]

## 2019-09-06 NOTE — Progress Notes (Addendum)
Inpatient Diabetes Program Recommendations  AACE/ADA: New Consensus Statement on Inpatient Glycemic Control (2015)  Target Ranges:  Prepandial:   less than 140 mg/dL      Peak postprandial:   less than 180 mg/dL (1-2 hours)      Critically ill patients:  140 - 180 mg/dL   Lab Results  Component Value Date   GLUCAP 191 (H) 09/06/2019   HGBA1C 7.5 (H) 09/05/2019    Review of Glycemic Control Results for KRISTIANE, MORSCH (MRN [id number]) as of 09/06/2019 12:04  Ref. Range 09/05/2019 17:39 09/05/2019 21:17 09/06/2019 08:17  Glucose-Capillary Latest Ref Range: 70 - 99 mg/dL 749 (H) 449 (H)  Novolog 2 units 191 (H)  Novolog 2 units   Diabetes history: DM 2 Outpatient Diabetes medications: 70/30 40 units bid, Metformin 500 mg bid Current orders for Inpatient glycemic control:  Novolog 0-9 units tid + hs  Inpatient Diabetes Program Recommendations:    - Consider Novolog Moderate correction 0-15 units tid + hs  - May need addition of basal insulin tomorrow 8/27.  Thanks,  Christena Deem RN, MSN, BC-ADM Inpatient Diabetes Coordinator Team Pager [phone] (8a-5p)

## 2019-09-06 NOTE — Progress Notes (Signed)
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 Presents with emphysematous pyelitis with renal abscess.  Currently plan is continue antibiotics and monitor cultures.  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 coli in May 2021 discussed with IR recommends CT guided drain urology consultation appreciated  Type II diabetes, hyperglycemia, insulin requiring -sliding scale insulin for now -Home meds insulin 70/30 and metformin  Chronic anemia continue iron pills  Hyperlipidemia on atorvastatin  relative hypotension history of hypertension -patient takes losartan 50 mg daily-- holding for now.  Diet: regular diet DVT Prophylaxis:  SCDs Start: 09/05/19 1742  Advance goals of care discussion: Full code  Family Communication: family was present at bedside, at the time of interview.  The pt provided permission to discuss medical plan with the family. Opportunity was given to ask question and all questions were answered satisfactorily.   Disposition:  Status is: Inpatient  Remains inpatient appropriate because:Inpatient level of care appropriate due to severity of illness  Dispo: The patient is from: Home              Anticipated d/c is to: Home              Anticipated d/c date is: 2 days              Patient currently is not medically stable to d/c.  Subjective: Continues to have fatigue and tiredness. Left flank pain improving. No nausea no vomiting.  Physical Exam:  General: Appear in mild distress, no Rash; Oral Mucosa Clear, moist. no Abnormal Neck Mass Or lumps, Conjunctiva normal  Cardiovascular: S1 and S2 Present, no Murmur, Respiratory: good  respiratory effort, Bilateral Air entry present and Clear to Auscultation, no Crackles, no wheezes Abdomen: Bowel Sound present, Soft and left CVA tenderness Extremities: no Pedal edema, no calf tenderness Neurology: alert and oriented to time, place, and person affect appropriate. no new focal deficit Gait not checked due to patient safety concerns  Vitals:   09/06/19 1435 09/06/19 1440 09/06/19 1455 09/06/19 1510  BP: (!) 142/81 122/76 132/70 124/79  Pulse: 99 (!) 101 99 98  Resp: (!) 24 (!) 26 (!) 22 (!) 22  Temp:      TempSrc:      SpO2: 100% 100% 100% 99%    Intake/Output Summary (Last 24 hours) at 09/06/2019 1903 Last data filed at 09/06/2019 1132 Gross per 24 hour  Intake 1393.05 ml  Output --  Net 1393.05 ml   There were no vitals filed for this visit.  Data Reviewed: I have personally reviewed and interpreted daily labs, tele strips, imagings as discussed above. I reviewed all nursing notes, pharmacy notes, vitals, pertinent old records I have discussed plan of care as described above with RN and patient/family.  CBC: Recent Labs  Lab 09/06/19 0844  WBC 11.8*  NEUTROABS 8.8*  HGB 8.8*  HCT 28.1*  MCV 82.4  PLT 310   Basic Metabolic Panel: Recent Labs  Lab 09/06/19 0844  NA 135  K 3.5  CL 103  CO2 20*  GLUCOSE 210*  BUN 15  CREATININE 0.90  CALCIUM 8.9  MG 1.5*    Studies: CT GUIDED NEEDLE PLACEMENT  Result Date: 09/06/2019 INDICATION: 39 year old  with large left renal/perirenal abscess. EXAM: CT-GUIDED DRAINAGE OF LEFT RENAL ABSCESS LIMITED ULTRASOUND EVALUATION OF THE LEFT KIDNEY MEDICATIONS: The patient is currently admitted to the hospital and receiving intravenous antibiotics. The antibiotics were administered within an appropriate time frame prior to the initiation of the procedure. ANESTHESIA/SEDATION: Fentanyl 50 mcg IV; Versed 1.0 mg IV Moderate Sedation Time:  14 minutes The patient was continuously monitored during the procedure by the  interventional radiology nurse under my direct supervision. COMPLICATIONS: None immediate. PROCEDURE: Informed written consent was obtained from the patient after a thorough discussion of the procedural risks, benefits and alternatives. All questions were addressed. A timeout was performed prior to the initiation of the procedure. Patient was placed prone on the CT scanner. Initially, the left kidney was evaluated with ultrasound. Ultrasound demonstrated a complex hypoechoic collection involving the left kidney with internal gas. Limited evaluation of the left kidney due to the large amount of gas within the collection. As a result, ultrasound was not used for drain placement. CT images of the left kidney were obtained. The renal/perirenal abscess was targeted along the lateral aspect. The overlying skin was prepped with chlorhexidine and sterile field was created. Maximal barrier sterile technique was utilized including caps, mask, sterile gowns, sterile gloves, sterile drape, hand hygiene and skin antiseptic. Skin was anesthetized using 1% lidocaine. Using CT guidance, an 18 gauge trocar needle was directed into the large renal abscess and yellow purulent fluid was aspirated. Superstiff Amplatz wire was advanced into the collection. The tract was dilated to accommodate a 12 [place] multipurpose drain. Approximately 320 mL of thick yellow purulent fluid was removed. Sample was sent for culture. Catheter was attached to a gravity bag and sutured in place. FINDINGS: Large complex perirenal/renal abscess involving the left kidney. Multiple air-fluid levels present. Following placement of the 90 French drain, greater than 300 mL of purulent fluid was removed. Post drain CT images demonstrate that majority of the fluid was removed with residual gas-filled pockets involving the left kidney. IMPRESSION: 1. CT-guided placement of a drainage catheter within the left renal/perirenal abscess. Greater than 300 mL of purulent  fluid was removed. 2. Recommend follow-up CT imaging with IV contrast when the drain output decreases. Electronically Signed   By: Richarda Overlie M.D.   On: 09/06/2019 15:27   [place] Intraoperative  Result Date: 09/06/2019 CLINICAL DATA:  Ultrasound was provided for use by the ordering physician, and a technical charge was applied by the performing facility.  No radiologist interpretation/professional services rendered.    Scheduled Meds: . atorvastatin  40 mg Oral Daily  . fentaNYL      . ferrous sulfate  325 mg Oral Q breakfast  . insulin aspart  0-5 Units Subcutaneous QHS  . insulin aspart  0-9 Units Subcutaneous TID WC  . losartan  50 mg Oral Daily  . midazolam      . [START ON 09/07/2019] multivitamin with minerals  1 tablet Oral Daily  . [START ON 09/07/2019] Ensure Max Protein  11 oz Oral BID  . senna  1 tablet Oral BID  . sodium chloride flush  5 mL Intracatheter Q8H  . topiramate  100 mg Oral BID   Continuous Infusions: . sodium chloride 75 mL/hr at 09/06/19 1824  . ceFEPime (MAXIPIME) IV Stopped (09/06/19 0622)   PRN Meds: acetaminophen **OR** acetaminophen, HYDROcodone-acetaminophen, morphine injection, ondansetron **OR** ondansetron (ZOFRAN) IV, polyethylene glycol, sodium chloride flush  Time spent: 35 minutes  Author: Lynden Oxford, MD [facility] 09/06/2019 7:03 PM  To reach On-call, see care teams to locate the attending and reach out via [email or web address]. Between 7PM-7AM, please contact night-coverage If you still have difficulty reaching the attending provider, please page the [facility] (Director on Call) for [facility] on amion for assistance.

## 2019-09-06 NOTE — Consult Note (Signed)
Chief Complaint: Patient was seen in consultation today for left renal abscess  Referring Physician(s): Enedina Finner  Patient Status: [facility] - In-pt  History of Present Illness: Katelyn Barnes is a 39 y.o. female with history of left pyelonephritis and hydronephrosis.  History of left ureter stent.  Patient recently presented to the urology office with left flank pain, fevers and chills.  CT study demonstrated severe left renal infection suspicious for abscess.  Patient was admitted and needs image guided drainage of the left perirenal/renal abscess.  No significant abdominal pain at this time.  Past Medical History:  Diagnosis Date  . DDD (degenerative disc disease), lumbar   . Diabetes mellitus without complication (HCC)    Type II  . DVT (deep venous thrombosis) (HCC) 2016   post back surgery - was on Eliquis 3 months  . Kidney stone     Past Surgical History:  Procedure Laterality Date  . ANTERIOR CERVICAL DECOMP/DISCECTOMY FUSION Right 05/05/2016   Procedure: ANTERIOR CERVICAL DECOMPRESSION FUSION, CERVICAL 7- THORACIC 1 WITH INSTRUMENTATION AND ALLOGRAFT;  Surgeon: Estill Bamberg, MD;  Location: [facility] OR;  Service: Orthopedics;  Laterality: Right;  ANTERIOR CERVICAL DECOMPRESSION FUSION, CERVICAL 7- THORACIC 1 WITH INSTRUMENTATION AND ALLOGRAFT  . BACK SURGERY  2016   discectomy   . CARPAL TUNNEL RELEASE Bilateral   . CYSTOSCOPY W/ RETROGRADES Left 07/06/2019   Procedure: CYSTOSCOPY WITH RETROGRADE PYELOGRAM;  Surgeon: [name], MD;  Location: [facility] ORS;  Service: Urology;  Laterality: Left;  . CYSTOSCOPY WITH URETEROSCOPY AND STENT PLACEMENT Left 06/11/2019   Procedure: CYSTOSCOPY WITH URETEROSCOPY AND STENT PLACEMENT;  Surgeon: Jerilee Field, MD;  Location: [facility] ORS;  Service: Urology;  Laterality: Left;  . CYSTOSCOPY WITH URETEROSCOPY AND STENT PLACEMENT Left 07/06/2019   Procedure: CYSTOSCOPY WITH URETEROSCOPY AND STENT exchange;  Surgeon: [name], MD;   Location: [facility] ORS;  Service: Urology;  Laterality: Left;  . SHOULDER ARTHROSCOPY    . TUBAL LIGATION  08/2007    Allergies: Penicillins and Macrobid [nitrofurantoin]  Medications: Prior to Admission medications   Medication Sig Start Date End Date Taking? Authorizing Provider  atorvastatin (LIPITOR) 40 MG tablet Take by mouth. 07/10/19  Yes [provider]  ferrous sulfate 325 (65 FE) MG tablet Take by mouth. 08/06/19 08/05/20 Yes [provider]  insulin isophane & regular human (HUMULIN 70/30 KWIKPEN) (70-30) 100 UNIT/ML KwikPen Inject 40 Units into the skin 2 (two) times daily with a meal.  07/10/19 07/09/20 Yes [provider]  losartan (COZAAR) 50 MG tablet Take 25 mg by mouth daily.  07/10/19 07/09/20 Yes [provider]  metFORMIN (GLUCOPHAGE) 500 MG tablet Take 500 mg by mouth 2 (two) times daily. 08/04/19  Yes [provider]  topiramate (TOPAMAX) 25 MG tablet 4 Tablet(s) By Mouth Twice Daily 07/10/19 07/09/20 Yes [provider]     History reviewed. No pertinent family history.  Social History   Socioeconomic History  . Marital status: Legally Separated    Spouse name: Not on file  . Number of children: Not on file  . Years of education: Not on file  . Highest education level: Not on file  Occupational History  . Not on file  Tobacco Use  . Smoking status: Never Smoker  . Smokeless tobacco: Never Used  Vaping Use  . Vaping Use: Never used  Substance and Sexual Activity  . Alcohol use: No  . Drug use: No  . Sexual activity: Yes  Other Topics Concern  .  Not on file  Social History Narrative  . Not on file   Social Determinants of Health   Financial Resource Strain:   . Difficulty of Paying Living Expenses: Not on file  Food Insecurity:   . Worried About Programme researcher, broadcasting/film/videounning Out of Food in the Last Year: Not on file  . Ran Out of Food in the Last Year: Not on file  Transportation Needs:   . Lack of Transportation (Medical):  Not on file  . Lack of Transportation (Non-Medical): Not on file  Physical Activity:   . Days of Exercise per Week: Not on file  . Minutes of Exercise per Session: Not on file  Stress:   . Feeling of Stress : Not on file  Social Connections:   . Frequency of Communication with Friends and Family: Not on file  . Frequency of Social Gatherings with Friends and Family: Not on file  . Attends Religious Services: Not on file  . Active Member of Clubs or Organizations: Not on file  . Attends BankerClub or Organization Meetings: Not on file  . Marital Status: Not on file     Review of Systems  Constitutional: Positive for chills and fever.  Genitourinary: Positive for flank pain.    Vital Signs: BP 125/84 (BP Location: Right Arm)   Pulse 80   Temp 99.7 F (37.6 C) (Oral)   Resp 16   LMP 08/24/2019 (Exact Date)   SpO2 100%   Physical Exam Cardiovascular:     Rate and Rhythm: Normal rate and regular rhythm.  Pulmonary:     Effort: Pulmonary effort is normal.     Breath sounds: Normal breath sounds.  Abdominal:     General: Abdomen is flat.     Palpations: Abdomen is soft.  Neurological:     Mental Status: She is alert.     Imaging: DG Chest 2 View  Result Date: 08/26/2019 CLINICAL DATA:  Chest pain and dizziness 2 months. EXAM: CHEST - 2 VIEW COMPARISON:  06/14/2019 FINDINGS: Lungs are adequately inflated without consolidation or effusion. Cardiomediastinal silhouette and remainder the exam is unchanged. IMPRESSION: No active cardiopulmonary disease. Electronically Signed   By: Elberta Fortisaniel  Boyle M.D.   On: 08/26/2019 17:50   US RENAL  Result Date: 08/15/2019 CLINICAL DATA:  History of hydronephrosis EXAM: RENAL / URINARY TRACT ULTRASOUND COMPLETE COMPARISON:  Ultrasound 06/13/2019, CT 06/11/2019 FINDINGS: Right Kidney: Renal measurements: 13.2 x 6.8 x 7 cm = volume: 328.7 mL . Echogenicity within normal limits. No mass or hydronephrosis visualized. Left Kidney: Renal measurements:  15.5 x 8 x 8.9 cm = volume: 574.4 mL. Diffuse echogenic shadowing at the renal collecting system suggestive of air within the collecting system. This limits evaluation for hydronephrosis. Possible interval complex fluid collection at the lower pole of the left kidney, poorly defined. Bladder: Debris within the posterior bladder. Prevoid volume of 198.6 CC, postvoid volume of 23.1 cc. Other: None. IMPRESSION: 1. Diffuse echogenic shadowing at the left renal collecting system suspicious for gas within the collecting system, this limits evaluation for hydronephrosis. Consider correlation with CT. Possible development of small complex fluid collection at the lower pole of left kidney. This could also be evaluated at CT follow-up. 2. Both kidneys are generous in size. There is no right hydronephrosis. These results will be called to the ordering clinician or representative by the Radiologist Assistant, and communication documented in the PACS or [employer] Dashboard. Electronically Signed   By: Jasmine PangKim  Fujinaga M.D.   On: 08/15/2019 00:09  CT RENAL STONE STUDY  Result Date: 09/05/2019 CLINICAL DATA:  Left flank pain with nausea and chills for 3 days. History of left urinary tract obstruction with stent approximately 2-3 months ago. EXAM: CT ABDOMEN AND PELVIS WITHOUT CONTRAST TECHNIQUE: Multidetector CT imaging of the abdomen and pelvis was performed following the standard protocol without IV contrast. COMPARISON:  08/14/2019 renal ultrasound.  Most recent CT 06/11/2019 FINDINGS: Lower chest: Clear lung bases. Normal heart size without pericardial or pleural effusion. Hepatobiliary: Normal liver. Normal gallbladder, without biliary ductal dilatation. Pancreas: Normal, without mass or ductal dilatation. Spleen: Normal in size, without focal abnormality. Adrenals/Urinary Tract: Normal right adrenal gland. The left adrenal is poorly evaluated. Normal right kidney, without stone, hydronephrosis. No bladder calculi. Significant  progression of left renal infection. Normal renal morphology is essentially replaced (small amount of retained parenchyma is suspected in the lower pole left kidney on 48/2) by a gas and fluid collection, most consistent with abscess. Example 9.8 x 10.5 cm on 35/2. 12.3 cm craniocaudal on 77 coronal. Displacement of the descending colon laterally and adjacent small bowel loops minimally anteriorly. No hydroureter or ureteric stone. Stomach/Bowel: Normal stomach, without wall thickening. The descending colon is intimately associated with the left renal process, without well-defined fistulous communication identified. Example 47/2. Normal terminal ileum and appendix. Normal small bowel. Vascular/Lymphatic: Aortic atherosclerosis. Left periaortic 1.0 cm node on 39/2 is similar, likely reactive. No pelvic sidewall adenopathy. Reproductive: Normal uterus and adnexa.  Tubal ligation. Other: No significant free fluid. Mild pelvic floor laxity. No free intraperitoneal air. Musculoskeletal: Lumbar spondylosis. IMPRESSION: 1. 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 2. Borderline abdominal adenopathy, likely reactive. 3.  Aortic Atherosclerosis (ICD10-I70.0).  This is age advanced. These results will be called to the ordering clinician or representative by the Radiologist Assistant, and communication documented in the PACS or [employer]. Electronically Signed   By: Jeronimo Greaves M.D.   On: 09/05/2019 14:53    Labs:  CBC: Recent Labs    06/16/19 0533 06/16/19 0533 07/05/19 0826 07/06/19 1053 08/26/19 1707 09/06/19 0844  WBC 9.9  --  8.0  --  10.1 11.8*  HGB 10.0*   < > 8.7* 8.6* 8.8* 8.8*  HCT 29.8*   < > 27.7* 26.1* 27.9* 28.1*  PLT 103*  --  433*  --  279 310   < > = values in this interval not displayed.    COAGS: Recent Labs     06/12/19 0529 09/06/19 0844  INR 1.6* 1.3*    BMP: Recent Labs    06/16/19 0533 07/05/19 0826 08/26/19 1707 09/06/19 0844  NA 132* 135 132* 135  K 3.3* 3.4* 3.8 3.5  CL 106 97* 100 103  CO2 21* 27 19* 20*  GLUCOSE 125* 169* 329* 210*  BUN 18 6 23* 15  CALCIUM 7.4* 8.5* 9.3 8.9  CREATININE 0.83 0.81 1.03* 0.90  GFRNONAA >60 >60 >60 >60  GFRAA >60 >60 >60 >60    LIVER FUNCTION TESTS: Recent Labs    06/11/19 0852 06/13/19 0023 09/06/19 0844  BILITOT 2.8* 2.7* 1.3*  AST 39 36 12*  ALT 27 36 12  ALKPHOS 69 90 63  PROT 7.1 5.9* 8.2*  ALBUMIN 3.5 2.5* 2.9*    TUMOR MARKERS: No results for input(s): AFPTM, CEA, CA199, CHROMGRNA in the last 8760 hours.  Assessment and Plan:  39 year old with history  of left renal infection and now has evidence for a large complex left perirenal/renal abscess.  Based on the recent noncontrast imaging, suspect the abscess has a subcapsular component.  The abscess appears to be very complex.  No significant hydronephrosis identified.  Patient is a candidate for a CT-guided drainage of the left renal abscess.  Risks and benefits discussed with the patient including bleeding, infection, damage to adjacent structures, bowel perforation/fistula connection, and sepsis.  All of the patient's questions were answered, patient is agreeable to proceed. Consent signed and in chart.   Thank you for this interesting consult.  I greatly enjoyed meeting Omolara Carol and look forward to participating in their care.  A copy of this report was sent to the requesting provider on this date.  Electronically Signed: Arn Medal, MD 09/06/2019, 1:51 PM   I spent a total of 20 Minutes    in face to face in clinical consultation, greater than 50% of which was counseling/coordinating care for left renal abscess.

## 2019-09-07 LAB — URINE CULTURE: Culture: >=100000 — AB

## 2019-09-07 LAB — CBC WITH DIFFERENTIAL/PLATELET
Abs Immature Granulocytes: 0.07 10*3/uL (ref 0.00–0.07)
Basophils Absolute: 0 10*3/uL (ref 0.0–0.1)
Basophils Relative: 0 %
Eosinophils Absolute: 0.1 10*3/uL (ref 0.0–0.5)
Eosinophils Relative: 1 %
HCT: 27.6 % — ABNORMAL LOW (ref 36.0–46.0)
Hemoglobin: 8.7 g/dL — ABNORMAL LOW (ref 12.0–15.0)
Immature Granulocytes: 1 %
Lymphocytes Relative: 21 %
Lymphs Abs: 1.9 10*3/uL (ref 0.7–4.0)
MCH: 26 pg (ref 26.0–34.0)
MCHC: 31.5 g/dL (ref 30.0–36.0)
MCV: 82.6 fL (ref 80.0–100.0)
Monocytes Absolute: 0.8 10*3/uL (ref 0.1–1.0)
Monocytes Relative: 9 %
Neutro Abs: 6 10*3/uL (ref 1.7–7.7)
Neutrophils Relative %: 68 %
Platelets: 269 10*3/uL (ref 150–400)
RBC: 3.34 MIL/uL — ABNORMAL LOW (ref 3.87–5.11)
RDW: 16.5 % — ABNORMAL HIGH (ref 11.5–15.5)
WBC: 8.8 10*3/uL (ref 4.0–10.5)
nRBC: 0 % (ref 0.0–0.2)

## 2019-09-07 LAB — COMPREHENSIVE METABOLIC PANEL
ALT: 11 U/L (ref 0–44)
AST: 12 U/L — ABNORMAL LOW (ref 15–41)
Albumin: 2.5 g/dL — ABNORMAL LOW (ref 3.5–5.0)
Alkaline Phosphatase: 57 U/L (ref 38–126)
Anion gap: 8 (ref 5–15)
BUN: 13 mg/dL (ref 6–20)
CO2: 21 mmol/L — ABNORMAL LOW (ref 22–32)
Calcium: 8.6 mg/dL — ABNORMAL LOW (ref 8.9–10.3)
Chloride: 105 mmol/L (ref 98–111)
Creatinine, Ser: 0.75 mg/dL (ref 0.44–1.00)
GFR calc Af Amer: >60 mL/min (ref >60)
GFR calc non Af Amer: >60 mL/min (ref >60)
Glucose, Bld: 175 mg/dL — ABNORMAL HIGH (ref 70–99)
Potassium: 3.5 mmol/L (ref 3.5–5.1)
Sodium: 134 mmol/L — ABNORMAL LOW (ref 135–145)
Total Bilirubin: 1 mg/dL (ref 0.3–1.2)
Total Protein: 7.5 g/dL (ref 6.5–8.1)

## 2019-09-07 LAB — MAGNESIUM: Magnesium: 1.4 mg/dL — ABNORMAL LOW (ref 1.7–2.4)

## 2019-09-07 LAB — GLUCOSE, CAPILLARY
Glucose-Capillary: 188 mg/dL — ABNORMAL HIGH (ref 70–99)
Glucose-Capillary: 183 mg/dL — ABNORMAL HIGH (ref 70–99)
Glucose-Capillary: 165 mg/dL — ABNORMAL HIGH (ref 70–99)
Glucose-Capillary: 190 mg/dL — ABNORMAL HIGH (ref 70–99)

## 2019-09-07 MED ORDER — PROMETHAZINE HCL 25 MG/ML IJ SOLN
12.5000 mg | Freq: Four times a day (QID) | INTRAMUSCULAR | Status: DC | PRN
  Administered 2019-09-07 – 2019-09-08 (×2): 12.5 mg via INTRAVENOUS
  Filled 2019-09-07 (×3): qty 1

## 2019-09-07 MED ORDER — SENNOSIDES-DOCUSATE SODIUM 8.6-50 MG PO TABS
2.0000 | ORAL_TABLET | Freq: Two times a day (BID) | ORAL | Status: DC
  Administered 2019-09-07 – 2019-09-13 (×12): 2 via ORAL
  Filled 2019-09-07 (×12): qty 2

## 2019-09-07 MED ORDER — ENOXAPARIN SODIUM 40 MG/0.4ML ~~LOC~~ SOLN
40.0000 mg | SUBCUTANEOUS | Status: DC
  Administered 2019-09-07 – 2019-09-12 (×6): 40 mg via SUBCUTANEOUS
  Filled 2019-09-07 (×6): qty 0.4

## 2019-09-07 MED ORDER — SODIUM CHLORIDE 0.9 % IV SOLN
2.0000 g | INTRAVENOUS | Status: DC
  Administered 2019-09-07 – 2019-09-12 (×6): 2 g via INTRAVENOUS
  Filled 2019-09-07: qty 20
  Filled 2019-09-07: qty 2
  Filled 2019-09-07: qty 20
  Filled 2019-09-07: qty 2
  Filled 2019-09-07: qty 20
  Filled 2019-09-07: qty 2
  Filled 2019-09-07: qty 20

## 2019-09-07 MED ORDER — MAGNESIUM SULFATE 2 GM/50ML IV SOLN
2.0000 g | Freq: Once | INTRAVENOUS | Status: AC
  Administered 2019-09-07: 2 g via INTRAVENOUS
  Filled 2019-09-07: qty 50

## 2019-09-07 NOTE — Consult Note (Signed)
Pharmacy Antibiotic Note  Katelyn Barnes is a 39 y.o. female admitted on 09/05/2019 with a UTI.  Pharmacy was consulted for cefepime dosing. CT renal study showed marked progression of left renal infection, with near complete replacement of the renal parenchyma by gas and fluid, most consistent with abscess/abscesses, presumably secondary to emphysematous pyelonephritis. This is day # 3 of IV cefepime, renal function has been stable and leukocytosis resolved with no recent recorder febrile episodes  Plan:   Continue cefepime 2 grams IV every 8 hours   Temp (24hrs), Avg:98.9 F (37.2 C), Min:98.5 F (36.9 C), Max:99.7 F (37.6 C)  Recent Labs  Lab 09/05/19 1713 09/06/19 0844 09/07/19 0426  WBC  --  11.8* 8.8  CREATININE  --  0.90 0.75  LATICACIDVEN 1.9  --   --     Estimated Creatinine Clearance: 107.6 mL/min (by C-G formula based on SCr of 0.75 mg/dL).    Allergies  Allergen Reactions  . Penicillins Rash    14 years ago   . Macrobid [Nitrofurantoin] Nausea And Vomiting    Antimicrobials this admission: 8/25 cefepime >>   Microbiology results: 8/25 UCx: E coli (see culture for sensitivity results) 8/25 WCx (body fluid from abdomen): few E coli (sensitivities pending) 8/25 SARS CoV-2: negative 8/26 BCx: NG< 24h  Thank you for allowing pharmacy to be a part of this patient's care.  Lowella Bandy, PharmD, BCPS 09/07/2019 8:56 AM

## 2019-09-07 NOTE — Progress Notes (Signed)
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 Presents with emphysematous pyelitis with renal abscess.  Currently plan is continue antibiotics and monitor cultures.  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 coli in May 2021 Underwent CT guided drain placement by IR on 09/06/2019. Drain output in the last 24 hours 140 cc. Urine culture is growing E. coli sensitive to ceftriaxone therefore antibiotic narrowed from IV cefepime to ceftriaxone. Patient will require a repeat CT scan when the drain output is reduced. urology consultation appreciated  Type II diabetes, hyperglycemia, insulin requiring -sliding scale insulin for now -Home meds insulin 70/30 and metformin  Chronic anemia continue iron pills  Hyperlipidemia on atorvastatin  relative hypotension history of hypertension -patient takes losartan 50 mg daily-- holding for now.  Diet: regular diet DVT Prophylaxis:  enoxaparin (LOVENOX) injection 40 mg Start: 09/07/19 1400 SCDs Start: 09/05/19 1742  Advance goals of care discussion: Full code  Family Communication: family was present at bedside, at the time of interview.  The pt provided permission to discuss medical plan with the family. Opportunity was given to ask question and all questions were answered satisfactorily.   Disposition:  Status is: Inpatient  Remains inpatient appropriate because:Inpatient level of care appropriate due to severity of illness  Dispo: The patient is from: Home              Anticipated d/c is to: Home              Anticipated d/c date is: 2 days              Patient currently is not medically stable to  d/c.  Subjective: No BM.  Passing gas.  Occasional nausea.  No vomiting.  Minimal oral intake due to nausea.  Physical Exam:  General: Appear in mild distress, no Rash; Oral Mucosa Clear, moist. no Abnormal Neck Mass Or lumps, Conjunctiva normal  Cardiovascular: S1 and S2 Present, no Murmur, Respiratory: good respiratory effort, Bilateral Air entry present and Clear to Auscultation, no Crackles, no wheezes Abdomen: Bowel Sound present, Soft and left CVA tenderness Extremities: no Pedal edema, no calf tenderness Neurology: alert and oriented to time, place, and person affect appropriate. no new focal deficit Gait not checked due to patient safety concerns  Vitals:   09/06/19 2049 09/07/19 0356 09/07/19 1137 09/07/19 1958  BP: 119/77 116/76 113/76 107/74  Pulse: 91 93 75 85  Resp: 20 20 20    Temp: 98.6 F (37 C) 98.5 F (36.9 C) 98.2 F (36.8 C) 98.6 F (37 C)  TempSrc: Oral Oral Oral Oral  SpO2: 98% 99% 100% 100%    Intake/Output Summary (Last 24 hours) at 09/07/2019 2013 Last data filed at 09/07/2019 0530 Gross per 24 hour  Intake 1728.8 ml  Output 540 ml  Net 1188.8 ml   There were no vitals filed for this visit.  Data Reviewed: I have personally reviewed and interpreted daily labs, tele strips, imagings as discussed above. I reviewed all nursing notes, pharmacy notes, vitals, pertinent old records I have discussed plan of care as described above with RN and patient/family.  CBC: Recent Labs  Lab 09/06/19 0844 09/07/19 0426  WBC 11.8* 8.8  NEUTROABS 8.8* 6.0  HGB 8.8* 8.7*  HCT 28.1* 27.6*  MCV 82.4 82.6  PLT 310 269   Basic Metabolic Panel: Recent Labs  Lab 09/06/19 0844 09/07/19 0426  NA 135 134*  K 3.5 3.5  CL 103 105  CO2 20* 21*  GLUCOSE 210* 175*  BUN 15 13  CREATININE 0.90 0.75  CALCIUM 8.9 8.6*  MG 1.5* 1.4*    Studies: No results found.  Scheduled Meds: . atorvastatin  40 mg Oral Daily  . enoxaparin (LOVENOX) injection  40 mg  Subcutaneous Q24H  . ferrous sulfate  325 mg Oral Q breakfast  . insulin aspart  0-5 Units Subcutaneous QHS  . insulin aspart  0-9 Units Subcutaneous TID WC  . losartan  50 mg Oral Daily  . multivitamin with minerals  1 tablet Oral Daily  . Ensure Max Protein  11 oz Oral BID  . senna  1 tablet Oral BID  . sodium chloride flush  5 mL Intracatheter Q8H  . topiramate  100 mg Oral BID   Continuous Infusions: . cefTRIAXone (ROCEPHIN)  IV 2 g (09/07/19 1728)   PRN Meds: acetaminophen **OR** acetaminophen, HYDROcodone-acetaminophen, morphine injection, ondansetron **OR** ondansetron (ZOFRAN) IV, polyethylene glycol, promethazine, sodium chloride flush  Time spent: 35 minutes  Author: Lynden Oxford, MD [facility] 09/07/2019 8:13 PM  To reach On-call, see care teams to locate the attending and reach out via [email or web address]. Between 7PM-7AM, please contact night-coverage If you still have difficulty reaching the attending provider, please page the [facility] (Director on Call) for [facility] on amion for assistance.

## 2019-09-07 NOTE — Progress Notes (Signed)
Urology Inpatient Progress Note  Subjective: Afebrile, VSS. WBC count down today, 8.8.  Creatinine down today, 0.75. Urine culture preliminary positive for E. coli, susceptibilities pending.  Blood cultures with no growth at <24 hours.  Abscess culture pending.  On antibiotics as below. Left abscess drain placed yesterday by IR.  140 mL of output yesterday.  Drain in place today with purulent output. Today she reports tube site tenderness and nausea.  She did have an episode of emesis yesterday.  Anti-infectives: Anti-infectives (From admission, onward)   Start     Dose/Rate Route Frequency Ordered Stop   09/05/19 1800  ceFEPIme (MAXIPIME) 2 g in sodium chloride 0.9 % 100 mL IVPB        2 g 200 mL/hr over 30 Minutes Intravenous Every 8 hours 09/05/19 1652        Current Facility-Administered Medications  Medication Dose Route Frequency Provider Last Rate Last Admin  . acetaminophen (TYLENOL) tablet 650 mg  650 mg Oral Q6H PRN Enedina Finner, MD       Or  . acetaminophen (TYLENOL) suppository 650 mg  650 mg Rectal Q6H PRN Enedina Finner, MD      . atorvastatin (LIPITOR) tablet 40 mg  40 mg Oral Daily Enedina Finner, MD   40 mg at 09/06/19 2043  . ceFEPIme (MAXIPIME) 2 g in sodium chloride 0.9 % 100 mL IVPB  2 g Intravenous Q8H Enedina Finner, MD   Stopped at 09/07/19 [phone]  . ferrous sulfate tablet 325 mg  325 mg Oral Q breakfast Enedina Finner, MD   325 mg at 09/07/19 2633  . HYDROcodone-acetaminophen (NORCO/VICODIN) 5-325 MG per tablet 1-2 tablet  1-2 tablet Oral Q4H PRN Enedina Finner, MD   2 tablet at 09/07/19 0536  . insulin aspart (novoLOG) injection 0-5 Units  0-5 Units Subcutaneous QHS Enedina Finner, MD   2 Units at 09/05/19 2200  . insulin aspart (novoLOG) injection 0-9 Units  0-9 Units Subcutaneous TID WC Enedina Finner, MD   1 Units at 09/07/19 [phone]  . losartan (COZAAR) tablet 50 mg  50 mg Oral Daily Enedina Finner, MD   50 mg at 09/07/19 6256  . morphine 2 MG/ML injection 2 mg  2 mg Intravenous Q4H PRN  Enedina Finner, MD   2 mg at 09/07/19 0405  . multivitamin with minerals tablet 1 tablet  1 tablet Oral Daily Rolly Salter, MD   1 tablet at 09/07/19 [phone]  . ondansetron (ZOFRAN) tablet 4 mg  4 mg Oral Q6H PRN Enedina Finner, MD   4 mg at 09/07/19 0830   Or  . ondansetron (ZOFRAN) injection 4 mg  4 mg Intravenous Q6H PRN Enedina Finner, MD   4 mg at 09/06/19 1226  . polyethylene glycol (MIRALAX / GLYCOLAX) packet 17 g  17 g Oral Daily PRN Enedina Finner, MD      . protein supplement (ENSURE MAX) liquid  11 oz Oral BID Rolly Salter, MD      . senna [facility]) tablet 8.6 mg  1 tablet Oral BID Enedina Finner, MD   8.6 mg at 09/07/19 7342  . sodium chloride flush (NS) 0.9 % injection 10-40 mL  10-40 mL Intracatheter PRN Enedina Finner, MD      . sodium chloride flush (NS) 0.9 % injection 5 mL  5 mL Intracatheter Q8H Richarda Overlie, MD   5 mL at 09/07/19 0603  . topiramate (TOPAMAX) tablet 100 mg  100 mg Oral BID Enedina Finner, MD   100 mg  at 09/07/19 0830   Objective: Vital signs in last 24 hours: Temp:  [98.5 F (36.9 C)-99.7 F (37.6 C)] 98.5 F (36.9 C) (08/27 0356) Pulse Rate:  [80-101] 93 (08/27 0356) Resp:  [16-29] 20 (08/27 0356) BP: (116-142)/(68-84) 116/76 (08/27 0356) SpO2:  [98 %-100 %] 99 % (08/27 0356)  Intake/Output from previous day: 08/26 0701 - 08/27 0700 In: 2356 [P.O.:480; I.V.:1666; IV Piggyback:200] Out: 540 [Urine:400; Drains:140] Intake/Output this shift: No intake/output data recorded.  Physical Exam Vitals and nursing note reviewed.  Constitutional:      General: She is not in acute distress.    Appearance: She is not ill-appearing, toxic-appearing or diaphoretic.  HENT:     Head: Normocephalic and atraumatic.  Pulmonary:     Effort: Pulmonary effort is normal. No respiratory distress.  Genitourinary:    Comments: Left flank tenderness Skin:    General: Skin is warm and dry.  Neurological:     Mental Status: She is alert and oriented to person, place, and time.   Psychiatric:        Mood and Affect: Mood normal.        Behavior: Behavior normal.    Lab Results:  Recent Labs    09/06/19 0844 09/07/19 0426  WBC 11.8* 8.8  HGB 8.8* 8.7*  HCT 28.1* 27.6*  PLT 310 269   BMET Recent Labs    09/06/19 0844 09/07/19 0426  NA 135 134*  K 3.5 3.5  CL 103 105  CO2 20* 21*  GLUCOSE 210* 175*  BUN 15 13  CREATININE 0.90 0.75  CALCIUM 8.9 8.6*   PT/INR Recent Labs    09/06/19 0844  LABPROT 15.2  INR 1.3*   Assessment & Plan: 39 year old female with PMH diabetes admitted with progressing left renal abscess presumably secondary to emphysematous pyelitis, on broad-spectrum IV antibiotics and now s/p CT-guided abscess drainage placement by IR.  Labs improving following abscess drain placement.  Urine cultures preliminary positive for E. coli with susceptibilities pending.  She is having some drain site tenderness and nausea today despite Zofran every 4 hours.  Recommendations: -Continue broad-spectrum antibiotics and follow cultures -Continue to monitor drain output -May consider Phenergan as antinausea alternative given breakthrough nausea on Zofran -Agree with IR recommendations for follow-up CT AP with contrast when drain output decreases to confirm improvement versus resolution of abscess  Carman Ching, PA-C 09/07/2019

## 2019-09-08 ENCOUNTER — Inpatient Hospital Stay: Payer: Medicaid Other

## 2019-09-08 LAB — BODY FLUID CULTURE

## 2019-09-08 LAB — CULTURE, URINE COMPREHENSIVE

## 2019-09-08 MED ORDER — PROMETHAZINE HCL 25 MG/ML IJ SOLN
12.5000 mg | Freq: Four times a day (QID) | INTRAMUSCULAR | Status: DC | PRN
  Administered 2019-09-08 – 2019-09-10 (×7): 25 mg via INTRAVENOUS
  Administered 2019-09-10: 12.5 mg via INTRAVENOUS
  Administered 2019-09-11 (×2): 25 mg via INTRAVENOUS
  Administered 2019-09-12 (×2): 12.5 mg via INTRAVENOUS
  Administered 2019-09-13: 25 mg via INTRAVENOUS
  Administered 2019-09-13: 12.5 mg via INTRAVENOUS
  Filled 2019-09-08 (×14): qty 1

## 2019-09-08 MED ORDER — PROMETHAZINE HCL 25 MG/ML IJ SOLN: 12.5000 mg | Freq: Four times a day (QID) | INTRAMUSCULAR | Status: DC | PRN

## 2019-09-08 MED ORDER — LACTULOSE 10 GM/15ML PO SOLN
20.0000 g | Freq: Two times a day (BID) | ORAL | Status: DC | PRN
  Administered 2019-09-08: 20 g via ORAL
  Filled 2019-09-08: qty 30

## 2019-09-08 MED ORDER — METHOCARBAMOL 500 MG PO TABS
500.0000 mg | ORAL_TABLET | Freq: Three times a day (TID) | ORAL | Status: DC
  Administered 2019-09-08 – 2019-09-13 (×15): 500 mg via ORAL
  Filled 2019-09-08 (×15): qty 1

## 2019-09-08 MED ORDER — POLYETHYLENE GLYCOL 3350 17 G PO PACK
17.0000 g | PACK | Freq: Every day | ORAL | Status: DC
  Administered 2019-09-08 – 2019-09-13 (×6): 17 g via ORAL
  Filled 2019-09-08 (×5): qty 1

## 2019-09-08 NOTE — Progress Notes (Signed)
[facility] Progress Note  Patient: Katelyn Barnes    HFW:[id number]  DOA: 09/05/2019     Date of Service: the patient was seen and examined on 09/08/2019  Brief hospital course: Past medical history of recurrent UTIs secondary to ureteral stricture. Presents with emphysematous pyelitis with renal abscess. Currently plan is continue antibiotics and monitor cultures.  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 coli in May 2021. Underwent CT guided drain placement by IR on 09/06/2019. Drain output in the last 24 hours 150 cc. Urine culture is growing E. coli sensitive to ceftriaxone therefore antibiotic narrowed from IV cefepime to ceftriaxone. Patient will require a repeat CT scan when the drain output is reduced. urology consultation appreciated  Type II diabetes, hyperglycemia, insulin requiring sliding scale insulin for now Home meds insulin 70/30 and metformin Hemoglobin A1c 7.5 on admission.  Chronic anemia continue iron pills  Hyperlipidemia on atorvastatin  relative hypotension history of hypertension -patient takes losartan 50 mg daily-- holding for now.  Diet: regular diet DVT Prophylaxis:  enoxaparin (LOVENOX) injection 40 mg Start: 09/07/19 1400 SCDs Start: 09/05/19 1742  Advance goals of care discussion: Full code  Family Communication: no family was present at bedside, at the time of interview.   Disposition:  Status is: Inpatient  Remains inpatient appropriate because:Inpatient level of care appropriate due to severity of illness  Dispo: The patient is from: Home              Anticipated d/c is to: Home              Anticipated d/c date is: 2 days              Patient currently is not medically stable to d/c.  Subjective: No acute complaint.  Continues to have nausea.  No vomiting.  Oral intake  gradually improving.  No abdominal pain.  Physical Exam:  General: Appear in mild distress, no Rash; Oral Mucosa Clear, moist. no Abnormal Neck Mass Or lumps, Conjunctiva normal  Cardiovascular: S1 and S2 Present, no Murmur, Respiratory: good respiratory effort, Bilateral Air entry present and Clear to Auscultation, no Crackles, no wheezes Abdomen: Bowel Sound present, Soft and no tenderness Extremities: no Pedal edema, no calf tenderness Neurology: alert and oriented to time, place, and person affect appropriate. no new focal deficit Gait not checked due to patient safety concerns  Vitals:   09/07/19 1958 09/08/19 0427 09/08/19 0825 09/08/19 1140  BP: 107/74 126/82 125/89 108/76  Pulse: 85 81 88 90  Resp:  19 18 16   Temp: 98.6 F (37 C) 98.6 F (37 C) 98.2 F (36.8 C) 98.7 F (37.1 C)  TempSrc: Oral Oral Oral Oral  SpO2: 100% 98% 100% 99%    Intake/Output Summary (Last 24 hours) at 09/08/2019 1630 Last data filed at 09/08/2019 1226 Gross per 24 hour  Intake 590 ml  Output 2747 ml  Net -2157 ml   There were no vitals filed for this visit.  Data Reviewed: I have personally reviewed and interpreted daily labs, tele strips, imagings as discussed above. I reviewed all nursing notes, pharmacy notes, vitals, pertinent old records I have discussed plan of care as described above with RN and patient/family.  CBC: Recent Labs  Lab 09/06/19 0844 09/07/19 0426  WBC 11.8* 8.8  NEUTROABS 8.8* 6.0  HGB 8.8* 8.7*  HCT 28.1* 27.6*  MCV  82.4 82.6  PLT 310 269   Basic Metabolic Panel: Recent Labs  Lab 09/06/19 0844 09/07/19 0426  NA 135 134*  K 3.5 3.5  CL 103 105  CO2 20* 21*  GLUCOSE 210* 175*  BUN 15 13  CREATININE 0.90 0.75  CALCIUM 8.9 8.6*  MG 1.5* 1.4*    Studies: No results found.  Scheduled Meds: . atorvastatin  40 mg Oral Daily  . enoxaparin (LOVENOX) injection  40 mg Subcutaneous Q24H  . ferrous sulfate  325 mg Oral Q breakfast  . insulin aspart  0-5  Units Subcutaneous QHS  . insulin aspart  0-9 Units Subcutaneous TID WC  . losartan  50 mg Oral Daily  . multivitamin with minerals  1 tablet Oral Daily  . polyethylene glycol  17 g Oral Daily  . Ensure Max Protein  11 oz Oral BID  . senna-docusate  2 tablet Oral BID  . sodium chloride flush  5 mL Intracatheter Q8H  . topiramate  100 mg Oral BID   Continuous Infusions: . cefTRIAXone (ROCEPHIN)  IV 2 g (09/07/19 1728)   PRN Meds: acetaminophen **OR** acetaminophen, HYDROcodone-acetaminophen, lactulose, morphine injection, promethazine, sodium chloride flush  Time spent: 35 minutes  Author: Lynden Oxford, MD [facility] 09/08/2019 4:30 PM  To reach On-call, see care teams to locate the attending and reach out via [email or web address]. Between 7PM-7AM, please contact night-coverage If you still have difficulty reaching the attending provider, please page the [facility] (Director on Call) for [facility] on amion for assistance.

## 2019-09-08 NOTE — Plan of Care (Signed)
Nausea medicine given as needed. Pain meds provided as needed as well. Voiding without problems. Monitoring drain output. Drain flush 3x per day.  Problem: Education: Goal: Knowledge of General Education information will improve Description: Including pain rating scale, medication(s)/side effects and non-pharmacologic comfort measures Outcome: Progressing   Problem: Health Behavior/Discharge Planning: Goal: Ability to manage health-related needs will improve Outcome: Progressing   Problem: Clinical Measurements: Goal: Ability to maintain clinical measurements within normal limits will improve Outcome: Progressing Goal: Will remain free from infection Outcome: Progressing Goal: Diagnostic test results will improve Outcome: Progressing Goal: Respiratory complications will improve Outcome: Progressing Goal: Cardiovascular complication will be avoided Outcome: Progressing   Problem: Activity: Goal: Risk for activity intolerance will decrease Outcome: Progressing   Problem: Nutrition: Goal: Adequate nutrition will be maintained Outcome: Progressing   Problem: Coping: Goal: Level of anxiety will decrease Outcome: Progressing   Problem: Elimination: Goal: Will not experience complications related to bowel motility Outcome: Progressing Goal: Will not experience complications related to urinary retention Outcome: Progressing   Problem: Pain Managment: Goal: General experience of comfort will improve Outcome: Progressing   Problem: Safety: Goal: Ability to remain free from injury will improve Outcome: Progressing   Problem: Skin Integrity: Goal: Risk for impaired skin integrity will decrease Outcome: Progressing

## 2019-09-08 NOTE — Progress Notes (Signed)
Blood glucose reading Morning:  198 Noon: 229 Evening: 167

## 2019-09-09 ENCOUNTER — Inpatient Hospital Stay: Payer: Medicaid Other

## 2019-09-09 LAB — BASIC METABOLIC PANEL
Anion gap: 10 (ref 5–15)
BUN: 16 mg/dL (ref 6–20)
CO2: 21 mmol/L — ABNORMAL LOW (ref 22–32)
Calcium: 8.6 mg/dL — ABNORMAL LOW (ref 8.9–10.3)
Chloride: 104 mmol/L (ref 98–111)
Creatinine, Ser: 0.71 mg/dL (ref 0.44–1.00)
GFR calc Af Amer: >60 mL/min (ref >60)
GFR calc non Af Amer: >60 mL/min (ref >60)
Glucose, Bld: 204 mg/dL — ABNORMAL HIGH (ref 70–99)
Potassium: 3.9 mmol/L (ref 3.5–5.1)
Sodium: 135 mmol/L (ref 135–145)

## 2019-09-09 LAB — CBC
HCT: 28.5 % — ABNORMAL LOW (ref 36.0–46.0)
Hemoglobin: 8.6 g/dL — ABNORMAL LOW (ref 12.0–15.0)
MCH: 25.7 pg — ABNORMAL LOW (ref 26.0–34.0)
MCHC: 30.2 g/dL (ref 30.0–36.0)
MCV: 85.1 fL (ref 80.0–100.0)
Platelets: 274 10*3/uL (ref 150–400)
RBC: 3.35 MIL/uL — ABNORMAL LOW (ref 3.87–5.11)
RDW: 16.4 % — ABNORMAL HIGH (ref 11.5–15.5)
WBC: 7.3 10*3/uL (ref 4.0–10.5)
nRBC: 0 % (ref 0.0–0.2)

## 2019-09-09 LAB — MAGNESIUM: Magnesium: 1.8 mg/dL (ref 1.7–2.4)

## 2019-09-09 MED ORDER — MAGNESIUM CITRATE PO SOLN
1.0000 | Freq: Once | ORAL | Status: AC
  Administered 2019-09-09: 1 via ORAL
  Filled 2019-09-09: qty 296

## 2019-09-09 MED ORDER — LACTULOSE 10 GM/15ML PO SOLN
20.0000 g | Freq: Two times a day (BID) | ORAL | Status: DC
  Administered 2019-09-09: 20 g via ORAL
  Filled 2019-09-09: qty 30

## 2019-09-09 MED ORDER — BISACODYL 10 MG RE SUPP: 10.0000 mg | Freq: Every day | RECTAL | Status: DC | PRN

## 2019-09-09 NOTE — Progress Notes (Signed)
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 Presents with emphysematous pyelitis with renal abscess. Currently plan is continue antibiotics and monitor cultures.  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 coli in May 2021. Underwent CT guided drain placement by IR on 09/06/2019. Drain output in the last 24 hours 150 cc. Urine culture is growing E. coli sensitive to ceftriaxone therefore antibiotic narrowed from IV cefepime to ceftriaxone. Due to worsening abdominal pain a CT scan of the abdomen was performed which shows evidence of severe fecal loading without any abnormality with the drain placement. Patient will require a repeat CT scan when the drain output is reduced. urology consultation appreciated  Type II diabetes, hyperglycemia, insulin requiring sliding scale insulin for now Home meds insulin 70/30 and metformin Hemoglobin A1c 7.5 on admission.  Chronic anemia continue iron pills  Hyperlipidemia on atorvastatin  relative hypotension history of hypertension -patient takes losartan 50 mg daily-- holding for now.  Severe constipation. Treatment with mag citrate and Senokot.  Diet: regular diet DVT Prophylaxis:  enoxaparin (LOVENOX) injection 40 mg Start: 09/07/19 1400 SCDs Start: 09/05/19 1742  Advance goals of care discussion: Full code  Family Communication: no family was present at bedside, at the time of interview.   Disposition:  Status is: Inpatient  Remains inpatient appropriate because:Inpatient level of care appropriate due to severity of illness  Dispo: The patient is from: Home               Anticipated d/c is to: Home              Anticipated d/c date is: 2 days              Patient currently is not medically stable to d/c.  Subjective: Continues to have nausea.  Mild abdominal pain.  Stabbing pain when she is taking a deep breath.  No fever no chills.  Physical Exam:  General: Appear in mild distress, no Rash; Oral Mucosa Clear, moist. no Abnormal Neck Mass Or lumps, Conjunctiva normal  Cardiovascular: S1 and S2 Present, no Murmur, Respiratory: good respiratory effort, Bilateral Air entry present and Clear to Auscultation, no Crackles, no wheezes Abdomen: Bowel Sound present, Soft and no tenderness Extremities: no Pedal edema, no calf tenderness Neurology: alert and oriented to time, place, and person affect appropriate. no new focal deficit Gait not checked due to patient safety concerns  Vitals:   09/08/19 1140 09/08/19 2001 09/09/19 0417 09/09/19 1251  BP: 108/76 111/73 119/82 (!) 130/93  Pulse: 90 83 84 96  Resp: 16 20 16 16   Temp: 98.7 F (37.1 C) 98.4 F (36.9 C) 98 F (36.7 C) 97.7 F (36.5 C)  TempSrc: Oral Oral Oral Oral  SpO2: 99% 99% 99% 100%    Intake/Output Summary (Last 24 hours) at 09/09/2019 1849 Last data filed at 09/09/2019 1316 Gross per 24 hour  Intake --  Output 1955 ml  Net -1955 ml   There were no vitals filed for this visit.  Data Reviewed: I have personally reviewed and interpreted daily labs, tele strips, imagings as discussed above. I reviewed all nursing notes, pharmacy notes, vitals, pertinent old records I have  discussed plan of care as described above with RN and patient/family.  CBC: Recent Labs  Lab 09/06/19 0844 09/07/19 0426 09/09/19 0739  WBC 11.8* 8.8 7.3  NEUTROABS 8.8* 6.0  --   HGB 8.8* 8.7* 8.6*  HCT 28.1* 27.6* 28.5*  MCV 82.4 82.6 85.1  PLT 310 269 274   Basic Metabolic Panel: Recent Labs  Lab 09/06/19 0844 09/07/19 0426 09/09/19 0739 09/09/19 0747  NA 135 134* 135  --   K 3.5 3.5 3.9  --    CL 103 105 104  --   CO2 20* 21* 21*  --   GLUCOSE 210* 175* 204*  --   BUN 15 13 16   --   CREATININE 0.90 0.75 0.71  --   CALCIUM 8.9 8.6* 8.6*  --   MG 1.5* 1.4*  --  1.8    Studies: CT RENAL STONE STUDY  Result Date: 09/09/2019 CLINICAL DATA:  CT-guided drain placed into a left renal abscess September 06, 2019. Sepsis. Drain output 150 cc in last 24 hours. Culture demonstrated coli. Patient on antibiotics. EXAM: CT ABDOMEN AND PELVIS WITHOUT CONTRAST TECHNIQUE: Multidetector CT imaging of the abdomen and pelvis was performed following the standard protocol without IV contrast. COMPARISON:  September 05, 2019 FINDINGS: Lower chest: Tiny left pleural effusion. Mild bibasilar atelectasis. The lower chest/lower lungs otherwise normal. Hepatobiliary: No focal liver abnormality is seen. No gallstones, gallbladder wall thickening, or biliary dilatation. Pancreas: Unremarkable. No pancreatic ductal dilatation or surrounding inflammatory changes. Spleen: Normal in size without focal abnormality. Adrenals/Urinary Tract: Adrenal glands are stable. Right kidney remains normal in appearance with no obstruction. The right ureter is normal. Persistent replacement of much of the left kidney with fluid and gas. A pigtail catheter terminates within the superior periphery of this collection. The maximum dimension of this collection is 8.1 x 8.2 cm today versus 10.5 x 9.8 cm previously. There is preservation of the renal cortex in the lower pole. Fluid and stranding adjacent to the left kidney remains, similar in the interval. The fluid appears free and non loculated. The left ureter and bladder are normal. Stomach/Bowel: The stomach and small bowel are normal. Severe fecal loading throughout the length of colon. The appendix is normal. Vascular/Lymphatic: No aneurysm or atherosclerotic change. Mild adenopathy in the left periaortic region, similar in the interval, likely reactive. No new adenopathy. Reproductive: Tubal  ligation clips. Uterus and ovaries are otherwise unremarkable. Other: No abdominal wall hernia or abnormality. No abdominopelvic ascites. Musculoskeletal: No acute or significant osseous findings. IMPRESSION: 1. There is a pigtail drainage catheter along the superior periphery of the remaining abscess. The abscess appears smaller. Most of the remaining collection is inferior to the pigtail. 2. Tiny left pleural effusion, new in the interval. 3. Severe fecal loading throughout the colon. 4. Reactive adenopathy in the left periaortic region. Electronically Signed   By: September 07, 2019 III M.D   On: 09/09/2019 16:03    Scheduled Meds: . atorvastatin  40 mg Oral Daily  . enoxaparin (LOVENOX) injection  40 mg Subcutaneous Q24H  . ferrous sulfate  325 mg Oral Q breakfast  . insulin aspart  0-5 Units Subcutaneous QHS  . insulin aspart  0-9 Units Subcutaneous TID WC  . losartan  50 mg Oral Daily  . methocarbamol  500 mg Oral TID  . multivitamin with minerals  1 tablet Oral Daily  . polyethylene glycol  17 g Oral Daily  . Ensure Max Protein  11 oz Oral BID  .  senna-docusate  2 tablet Oral BID  . sodium chloride flush  5 mL Intracatheter Q8H  . topiramate  100 mg Oral BID   Continuous Infusions: . cefTRIAXone (ROCEPHIN)  IV 2 g (09/09/19 1845)   PRN Meds: acetaminophen **OR** acetaminophen, bisacodyl, HYDROcodone-acetaminophen, lactulose, morphine injection, promethazine, sodium chloride flush  Time spent: 35 minutes  Author: Lynden Oxford, MD [facility] 09/09/2019 6:49 PM  To reach On-call, see care teams to locate the attending and reach out via [email or web address]. Between 7PM-7AM, please contact night-coverage If you still have difficulty reaching the attending provider, please page the [facility] (Director on Call) for [facility] on amion for assistance.

## 2019-09-10 DIAGNOSIS — E119 Type 2 diabetes mellitus without complications: Secondary | ICD-10-CM

## 2019-09-10 LAB — GLUCOSE, CAPILLARY
Glucose-Capillary: 181 mg/dL — ABNORMAL HIGH (ref 70–99)
Glucose-Capillary: 164 mg/dL — ABNORMAL HIGH (ref 70–99)
Glucose-Capillary: 198 mg/dL — ABNORMAL HIGH (ref 70–99)
Glucose-Capillary: 229 mg/dL — ABNORMAL HIGH (ref 70–99)
Glucose-Capillary: 167 mg/dL — ABNORMAL HIGH (ref 70–99)
Glucose-Capillary: 186 mg/dL — ABNORMAL HIGH (ref 70–99)
Glucose-Capillary: 171 mg/dL — ABNORMAL HIGH (ref 70–99)
Glucose-Capillary: 213 mg/dL — ABNORMAL HIGH (ref 70–99)
Glucose-Capillary: 167 mg/dL — ABNORMAL HIGH (ref 70–99)
Glucose-Capillary: 153 mg/dL — ABNORMAL HIGH (ref 70–99)
Glucose-Capillary: 187 mg/dL — ABNORMAL HIGH (ref 70–99)
Glucose-Capillary: 208 mg/dL — ABNORMAL HIGH (ref 70–99)

## 2019-09-10 LAB — IRON AND TIBC
Iron: 37 ug/dL (ref 28–170)
Saturation Ratios: 18 % (ref 10.4–31.8)
TIBC: 202 ug/dL — ABNORMAL LOW (ref 250–450)
UIBC: 165 ug/dL

## 2019-09-10 LAB — FERRITIN: Ferritin: 1002 ng/mL — ABNORMAL HIGH (ref 11–307)

## 2019-09-10 LAB — VITAMIN B12: Vitamin B-12: 356 pg/mL (ref 180–914)

## 2019-09-10 MED ORDER — LACTULOSE 10 GM/15ML PO SOLN
30.0000 g | Freq: Once | ORAL | Status: AC
  Administered 2019-09-10: 30 g via ORAL
  Filled 2019-09-10: qty 60

## 2019-09-10 NOTE — Progress Notes (Signed)
Mobility Specialist - Progress Note   09/10/19 1313  Mobility  Activity Ambulated in room  Level of Assistance Standby assist, set-up cues, supervision of patient - no hands on  Assistive Device None  Distance Ambulated (ft) 50 ft  Mobility Response Tolerated well  Mobility performed by Mobility specialist  $Mobility charge 1 Mobility    Pre-mobility: 95 HR, 98/72 BP, 99% SpO2 Post-mobility: 106 HR, 115/79 BP, 100% SpO2   Pt was lying in bed with niece in room upon arrival. Pt c/o nausea "4/10" but agreed to ambulate in the room. Pt was independent in sit-to-stand and SBA while ambulating. No AD was used. Pt ambulated a total of 50', noted LOBx1, further mobility was limited d/t pt c/o feeling lightheaded. Pt denied any SOB. Pt's RPE was rated a 2/10. Overall, pt tolerated session well. Pt was left EOB with phone/call bell in reach. Nurse was notified of performance.    Filiberto Pinks [facility] Specialist 09/10/19, 1:21 PM

## 2019-09-10 NOTE — Progress Notes (Signed)
PROGRESS NOTE    Katelyn Barnes  DEY:[id number] DOB: 25-Aug-1980 DOA: 09/05/2019 PCP: Katelyn Minium, PA   Chief complaint.  Left flank pain.   Brief Narrative:  Patient is a 39 year old female with history of essential hypertension, type 2 diabetes, previous emphysematous pyelitis and a left ureteral stricture, status post left renal stent. Patient came to urologist office complaining of left flank pain and fever.  CT scan showed left renal abscess.  Drain was placed on 8/26.  Her initial culture of the urine came back with E. Coli, blood culture negative.  Culture from drain still pending.  Currently covered with Rocephin.   Assessment & Plan:   Active Problems:   Renal abscess   Left flank pain   Diabetes mellitus ([place])   Essential hypertension  #1.  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 Coli. Renal drain still in place.  Drain output decreased from 150 mL from beginning to 70 mL. Continue Rocephin for E. coli.  Culture from drain still pending. We will need repeat CT scan when drains are ready to be removed. Appreciate urology consult.  2.  Fecal impaction. Continue stool softeners, give a dose of lactulose.  3.  Type 2 diabetes. Continue sliding scale insulin.  4.  Chronic anemia. Iron supplement, recheck a B12 and iron level.      DVT prophylaxis: Lovenox Code Status:Full Family Communication: None Disposition Plan:  . Patient came from: Home            . Anticipated d/c place: Home . Barriers to d/c OR conditions which need to be met to effect a safe d/c:   Consultants:   Urology  Procedures: Drain of  renal abscess. Antimicrobials: Rocephin  Subjective: Patient doing much better today.  Still has some pain in the left upper quadrant.  She had a one bowel movement after giving stool softeners yesterday.  No nausea vomiting.  No fever chills.  Objective: Vitals:   09/09/19 0417 09/09/19 1251 09/09/19 2120  09/10/19 0503  BP: 119/82 (!) 130/93 120/85 103/70  Pulse: 84 96 97 84  Resp: $Remo'16 16 20 20  'lYmKF$ Temp: 98 F (36.7 C) 97.7 F (36.5 C) 97.7 F (36.5 C) 98 F (36.7 C)  TempSrc: Oral Oral Oral Oral  SpO2: 99% 100% 100% 97%  Weight:   93.4 kg   Height:   '5\' 6"'$  (1.676 m)     Intake/Output Summary (Last 24 hours) at 09/10/2019 1034 Last data filed at 09/10/2019 0656 Gross per 24 hour  Intake 10 ml  Output 1478 ml  Net -1468 ml   Filed Weights   09/09/19 2120  Weight: 93.4 kg    Examination:  General exam: Appears calm and comfortable  Respiratory system: Clear to auscultation. Respiratory effort normal. Cardiovascular system: S1 & S2 heard, RRR. No JVD, murmurs, rubs, gallops or clicks. No pedal edema. Gastrointestinal system: Abdomen is nondistended, soft and nontender. No organomegaly or masses felt. Normal bowel sounds heard. Central nervous system: Alert and oriented. No focal neurological deficits. Extremities: Symmetric  Skin: No rashes, lesions or ulcers Psychiatry: Mood & affect appropriate.     Data Reviewed: I have personally reviewed following labs and imaging studies  CBC: Recent Labs  Lab 09/06/19 0844 09/07/19 0426 09/09/19 0739  WBC 11.8* 8.8 7.3  NEUTROABS 8.8* 6.0  --   HGB 8.8* 8.7* 8.6*  HCT 28.1* 27.6* 28.5*  MCV 82.4 82.6 85.1  PLT 310 269 314   Basic Metabolic  Panel: Recent Labs  Lab 09/06/19 0844 09/07/19 0426 09/09/19 0739 09/09/19 0747  NA 135 134* 135  --   K 3.5 3.5 3.9  --   CL 103 105 104  --   CO2 20* 21* 21*  --   GLUCOSE 210* 175* 204*  --   BUN $Re'15 13 16  'vcq$ --   CREATININE 0.90 0.75 0.71  --   CALCIUM 8.9 8.6* 8.6*  --   MG 1.5* 1.4*  --  1.8   GFR: Estimated Creatinine Clearance: 107.6 mL/min (by C-G formula based on SCr of 0.71 mg/dL). Liver Function Tests: Recent Labs  Lab 09/06/19 0844 09/07/19 0426  AST 12* 12*  ALT 12 11  ALKPHOS 63 57  BILITOT 1.3* 1.0  PROT 8.2* 7.5  ALBUMIN 2.9* 2.5*   No results for  input(s): LIPASE, AMYLASE in the last 168 hours. No results for input(s): AMMONIA in the last 168 hours. Coagulation Profile: Recent Labs  Lab 09/06/19 0844  INR 1.3*   Cardiac Enzymes: No results for input(s): CKTOTAL, CKMB, CKMBINDEX, TROPONINI in the last 168 hours. BNP (last 3 results) No results for input(s): PROBNP in the last 8760 hours. HbA1C: No results for input(s): HGBA1C in the last 72 hours. CBG: Recent Labs  Lab 09/07/19 1136 09/08/19 2105 09/09/19 0902 09/09/19 1701 09/10/19 0825  GLUCAP 190* 186* 171* 167* 187*   Lipid Profile: No results for input(s): CHOL, HDL, LDLCALC, TRIG, CHOLHDL, LDLDIRECT in the last 72 hours. Thyroid Function Tests: No results for input(s): TSH, T4TOTAL, FREET4, T3FREE, THYROIDAB in the last 72 hours. Anemia Panel: No results for input(s): VITAMINB12, FOLATE, FERRITIN, TIBC, IRON, RETICCTPCT in the last 72 hours. Sepsis Labs: Recent Labs  Lab 09/05/19 1713  LATICACIDVEN 1.9    Recent Results (from the past 240 hour(s))  CULTURE, URINE COMPREHENSIVE     Status: Abnormal   Collection Time: 09/05/19  1:04 PM   Specimen: Urine   UR  Result Value Ref Range Status   Urine Culture, Comprehensive Final report (A)  Final   Organism ID, Bacteria Escherichia coli (A)  Final    Comment: Greater than 100,000 colony forming units per mL Cefazolin with an MIC <=16 predicts susceptibility to the oral agents cefaclor, cefdinir, cefpodoxime, cefprozil, cefuroxime, cephalexin, and loracarbef when used for therapy of uncomplicated urinary tract infections due to E. coli, Klebsiella pneumoniae, and Proteus mirabilis.    ANTIMICROBIAL SUSCEPTIBILITY Comment  Final    Comment:       ** S = Susceptible; I = Intermediate; R = Resistant **                    P = Positive; N = Negative             MICS are expressed in micrograms per mL    Antibiotic                 RSLT#1    RSLT#2    RSLT#3    RSLT#4 Amoxicillin/Clavulanic Acid     I Ampicillin                     R Cefazolin                      S Cefepime                       S Ceftriaxone  S Cefuroxime                     S Ciprofloxacin                  S Ertapenem                      S Gentamicin                     S Imipenem                       S Levofloxacin                   S Meropenem                      S Nitrofurantoin                 S Piperacillin/Tazobactam        S Tetracycline                   S Tobramycin                     S Trimethoprim/Sulfa             R   Microscopic Examination     Status: Abnormal   Collection Time: 09/05/19  1:04 PM   Urine  Result Value Ref Range Status   WBC, UA 11-30 (A) 0 - 5 /hpf Final   RBC 3-10 (A) 0 - 2 /hpf Final   Epithelial Cells (non renal) 0-10 0 - 10 /hpf Final   Renal Epithel, UA 0-10 (A) None seen /hpf Final   Bacteria, UA Many (A) None seen/Few Final  Body fluid culture     Status: None   Collection Time: 09/05/19  4:51 PM   Specimen: Abdomen; Body Fluid  Result Value Ref Range Status   Specimen Description   Final    ABDOMEN Performed at [facility], [address], [place], [place] [address]    Special Requests   Final    NONE Performed at [facility], [address] [place], [place] [address]    Gram Stain   Final    FEW WBC PRESENT,BOTH PMN AND MONONUCLEAR FEW GRAM NEGATIVE RODS Performed at [facility], [place] [address] [place], [place] [address]    Culture FEW ESCHERICHIA COLI  Final   Report Status 09/08/2019 FINAL  Final   Organism ID, Bacteria ESCHERICHIA COLI  Final      Susceptibility   Escherichia coli - MIC*    AMPICILLIN >=32 RESISTANT Resistant     CEFAZOLIN 8 SENSITIVE Sensitive     CEFEPIME <=0.12 SENSITIVE Sensitive     CEFTAZIDIME <=1 SENSITIVE Sensitive     CEFTRIAXONE <=0.25 SENSITIVE Sensitive     CIPROFLOXACIN <=0.25 SENSITIVE Sensitive     GENTAMICIN <=1 SENSITIVE Sensitive     IMIPENEM <=0.25  SENSITIVE Sensitive     TRIMETH/SULFA >=320 RESISTANT Resistant     AMPICILLIN/SULBACTAM >=32 RESISTANT Resistant     PIP/TAZO 64 INTERMEDIATE Intermediate     * FEW ESCHERICHIA COLI  SARS Coronavirus 2 by RT PCR (hospital order, performed in [place] hospital lab) Nasopharyngeal Nasopharyngeal Swab     Status: None   Collection Time: 09/05/19  5:44 PM   Specimen: Nasopharyngeal Swab  Result Value Ref Range Status   SARS Coronavirus 2 NEGATIVE NEGATIVE Final    Comment: (NOTE) SARS-CoV-2 target nucleic acids are NOT DETECTED.  The SARS-CoV-2 RNA is generally detectable in upper and lower respiratory specimens during the acute phase of infection. The lowest concentration of SARS-CoV-2 viral copies this assay can detect is 250 copies / mL. A negative result does not preclude SARS-CoV-2 infection and should not be used as the sole basis for treatment or other patient management decisions.  A negative result may occur with improper specimen collection / handling, submission of specimen other than nasopharyngeal swab, presence of viral mutation(s) within the areas targeted by this assay, and inadequate number of viral copies (<250 copies / mL). A negative result must be combined with clinical observations, patient history, and epidemiological information.  Fact Sheet for Patients:   StrictlyIdeas.no  Fact Sheet for Healthcare Providers: BankingDealers.co.za  This test is not yet approved or  cleared by the [place] FDA and has been authorized for detection and/or diagnosis of SARS-CoV-2 by FDA under an Emergency Use Authorization (EUA).  This EUA will remain in effect (meaning this test can be used) for the duration of the COVID-19 declaration under Section 564(b)(1) of the Act, 21 U.S.C. section 360bbb-3(b)(1), unless the authorization is terminated or revoked sooner.  Performed at [facility], [address]  [place], [place] [address]   Urine Culture     Status: Abnormal   Collection Time: 09/05/19  6:30 PM   Specimen: Urine, Random  Result Value Ref Range Status   Specimen Description   Final    URINE, RANDOM Performed at [facility], [address] [place], [place] [address]    Special Requests   Final    NONE Performed at [facility], [address] [place], [place] [address]    Culture >=100,000 COLONIES/mL ESCHERICHIA COLI (A)  Final   Report Status 09/07/2019 FINAL  Final   Organism ID, Bacteria ESCHERICHIA COLI (A)  Final      Susceptibility   Escherichia coli - MIC*    AMPICILLIN >=32 RESISTANT Resistant     CEFAZOLIN 8 SENSITIVE Sensitive     CEFTRIAXONE <=0.25 SENSITIVE Sensitive     CIPROFLOXACIN <=0.25 SENSITIVE Sensitive     GENTAMICIN <=1 SENSITIVE Sensitive     IMIPENEM <=0.25 SENSITIVE Sensitive     NITROFURANTOIN <=16 SENSITIVE Sensitive     TRIMETH/SULFA >=320 RESISTANT Resistant     AMPICILLIN/SULBACTAM >=32 RESISTANT Resistant     PIP/TAZO 8 SENSITIVE Sensitive     * >=100,000 COLONIES/mL ESCHERICHIA COLI  CULTURE, BLOOD (ROUTINE X 2) w Reflex to ID Panel     Status: None (Preliminary result)   Collection Time: 09/06/19  8:44 AM   Specimen: BLOOD  Result Value Ref Range Status   Specimen Description BLOOD RIGHT FA  Final   Special Requests   Final    BOTTLES DRAWN AEROBIC AND ANAEROBIC Blood Culture adequate volume   Culture   Final    NO GROWTH 4 DAYS Performed at [facility], [address] [place],  [address]    Report Status PENDING  Incomplete  CULTURE, BLOOD (ROUTINE X 2) w Reflex to ID Panel     Status: None (Preliminary result)   Collection Time: 09/06/19  8:53 AM   Specimen: BLOOD  Result Value Ref Range Status   Specimen Description BLOOD RIGHT HAND  Final   Special Requests   Final    BOTTLES DRAWN  AEROBIC AND ANAEROBIC Blood Culture adequate volume   Culture   Final    NO GROWTH 4 DAYS Performed at  [facility], [address] [place], [place] [address]    Report Status PENDING  Incomplete  Aerobic/Anaerobic Culture (surgical/deep wound)     Status: None (Preliminary result)   Collection Time: 09/06/19  3:10 PM   Specimen: Abscess  Result Value Ref Range Status   Specimen Description   Final    ABSCESS Performed at [facility], [address] [place], [place] [address]    Special Requests   Final    LEFT RENAL Performed at [facility], [place] [address], [place], [place] [address]    Gram Stain PENDING  Incomplete   Culture PENDING  Incomplete   Report Status PENDING  Incomplete         Radiology Studies: DG Abd Portable 1V  Result Date: 09/08/2019 CLINICAL DATA:  Abdominal pain. Patient reports pain at catheter site. Constipation. EXAM: PORTABLE ABDOMEN - 1 VIEW COMPARISON:  Noncontrast CT 09/05/2019 FINDINGS: Left nephrostomy tube in place. Renal parenchymal air is seen on recent CT. Moderate stool in the ascending colon, small volume of stool in the descending colon. Air-filled sigmoid colon. No evidence of obstruction or small bowel dilatation. Tubal ligation clips in the pelvis. No radiopaque calculi. IMPRESSION: 1. Left nephrostomy tube in place. 2. Nonobstructive bowel gas pattern. Moderate stool in the proximal colon. Electronically Signed   By: Keith Rake M.D.   On: 09/08/2019 19:58   CT RENAL STONE STUDY  Result Date: 09/09/2019 CLINICAL DATA:  CT-guided drain placed into a left renal abscess September 06, 2019. Sepsis. Drain output 150 cc in last 24 hours. Culture demonstrated coli. Patient on antibiotics. EXAM: CT ABDOMEN AND PELVIS WITHOUT CONTRAST TECHNIQUE: Multidetector CT imaging of the abdomen and pelvis was performed following the standard protocol without IV contrast. COMPARISON:  September 05, 2019 FINDINGS: Lower chest: Tiny left pleural effusion. Mild bibasilar atelectasis. The lower chest/lower lungs otherwise normal.  Hepatobiliary: No focal liver abnormality is seen. No gallstones, gallbladder wall thickening, or biliary dilatation. Pancreas: Unremarkable. No pancreatic ductal dilatation or surrounding inflammatory changes. Spleen: Normal in size without focal abnormality. Adrenals/Urinary Tract: Adrenal glands are stable. Right kidney remains normal in appearance with no obstruction. The right ureter is normal. Persistent replacement of much of the left kidney with fluid and gas. A pigtail catheter terminates within the superior periphery of this collection. The maximum dimension of this collection is 8.1 x 8.2 cm today versus 10.5 x 9.8 cm previously. There is preservation of the renal cortex in the lower pole. Fluid and stranding adjacent to the left kidney remains, similar in the interval. The fluid appears free and non loculated. The left ureter and bladder are normal. Stomach/Bowel: The stomach and small bowel are normal. Severe fecal loading throughout the length of colon. The appendix is normal. Vascular/Lymphatic: No aneurysm or atherosclerotic change. Mild adenopathy in the left periaortic region, similar in the interval, likely reactive. No new adenopathy. Reproductive: Tubal ligation clips. Uterus and ovaries are otherwise unremarkable. Other: No abdominal wall hernia or abnormality. No abdominopelvic ascites. Musculoskeletal: No acute or significant osseous findings. IMPRESSION: 1. There is a pigtail drainage catheter along the superior periphery of the remaining abscess. The abscess appears smaller. Most of the remaining collection is inferior to the pigtail. 2. Tiny left pleural effusion, new in the interval. 3. Severe fecal loading throughout the colon. 4. Reactive adenopathy in the  left periaortic region. Electronically Signed   By: Dorise Bullion III M.D   On: 09/09/2019 16:03        Scheduled Meds: . atorvastatin  40 mg Oral Daily  . enoxaparin (LOVENOX) injection  40 mg Subcutaneous Q24H  . ferrous  sulfate  325 mg Oral Q breakfast  . insulin aspart  0-5 Units Subcutaneous QHS  . insulin aspart  0-9 Units Subcutaneous TID WC  . lactulose  30 g Oral Once  . losartan  50 mg Oral Daily  . methocarbamol  500 mg Oral TID  . multivitamin with minerals  1 tablet Oral Daily  . polyethylene glycol  17 g Oral Daily  . Ensure Max Protein  11 oz Oral BID  . senna-docusate  2 tablet Oral BID  . sodium chloride flush  5 mL Intracatheter Q8H  . topiramate  100 mg Oral BID   Continuous Infusions: . cefTRIAXone (ROCEPHIN)  IV Stopped (09/09/19 1915)     LOS: 5 days    Time spent: 28 minutes    Sharen Hones, MD [facility]   To contact the attending provider between 7A-7P or the covering provider during after hours 7P-7A, please log into the web site [email or web address] and access using universal [place] password for that web site. If you do not have the password, please call the hospital operator.  09/10/2019, 10:34 AM

## 2019-09-11 DIAGNOSIS — A4151 Sepsis due to Escherichia coli [E. coli]: Principal | ICD-10-CM

## 2019-09-11 DIAGNOSIS — R6520 Severe sepsis without septic shock: Secondary | ICD-10-CM

## 2019-09-11 DIAGNOSIS — N179 Acute kidney failure, unspecified: Secondary | ICD-10-CM

## 2019-09-11 LAB — CULTURE, BLOOD (ROUTINE X 2)
Culture: NO GROWTH
Culture: NO GROWTH
Special Requests: ADEQUATE
Special Requests: ADEQUATE

## 2019-09-11 LAB — GLUCOSE, CAPILLARY
Glucose-Capillary: 182 mg/dL — ABNORMAL HIGH (ref 70–99)
Glucose-Capillary: 177 mg/dL — ABNORMAL HIGH (ref 70–99)
Glucose-Capillary: 187 mg/dL — ABNORMAL HIGH (ref 70–99)
Glucose-Capillary: 213 mg/dL — ABNORMAL HIGH (ref 70–99)
Glucose-Capillary: 181 mg/dL — ABNORMAL HIGH (ref 70–99)

## 2019-09-11 MED ORDER — FLUCONAZOLE 100 MG PO TABS
200.0000 mg | ORAL_TABLET | Freq: Once | ORAL | Status: AC
  Administered 2019-09-11: 200 mg via ORAL
  Filled 2019-09-11: qty 2

## 2019-09-11 NOTE — Progress Notes (Signed)
Urology Inpatient Progress Note  Subjective: Afebrile, VSS. Repeat CT AP noncontrast dated 8/29 with interval decrease in left renal abscess size. Abscess drain with 43mL output yesterday; drain in place today draining purulent fluid. WBC count normalized on 8/29, 7.3. Creatinine stable on 8/29, 0.71. Abscess culture pending with few WBCs and Gram negative rods. Blood cultures finalized with no growth at 5 days. Urine culture finalized with Ampicillin, Amp/Sulbactam, and Trimethoprim/Sulfa-resistant E coli. On antibiotics as below. She reports stable nausea today.  Anti-infectives: Anti-infectives (From admission, onward)   Start     Dose/Rate Route Frequency Ordered Stop   09/07/19 1800  cefTRIAXone (ROCEPHIN) 2 g in sodium chloride 0.9 % 100 mL IVPB        2 g 200 mL/hr over 30 Minutes Intravenous Every 24 hours 09/07/19 1622     09/05/19 1800  ceFEPIme (MAXIPIME) 2 g in sodium chloride 0.9 % 100 mL IVPB  Status:  Discontinued        2 g 200 mL/hr over 30 Minutes Intravenous Every 8 hours 09/05/19 1652 09/07/19 1622      Current Facility-Administered Medications  Medication Dose Route Frequency Provider Last Rate Last Admin  . acetaminophen (TYLENOL) tablet 650 mg  650 mg Oral Q6H PRN Enedina Finner, MD       Or  . acetaminophen (TYLENOL) suppository 650 mg  650 mg Rectal Q6H PRN Enedina Finner, MD      . atorvastatin (LIPITOR) tablet 40 mg  40 mg Oral Daily Enedina Finner, MD   40 mg at 09/10/19 2200  . bisacodyl (DULCOLAX) suppository 10 mg  10 mg Rectal Daily PRN Rolly Salter, MD      . cefTRIAXone (ROCEPHIN) 2 g in sodium chloride 0.9 % 100 mL IVPB  2 g Intravenous Q24H Rolly Salter, MD   Stopped at 09/10/19 1820  . enoxaparin (LOVENOX) injection 40 mg  40 mg Subcutaneous Q24H Rolly Salter, MD   40 mg at 09/10/19 1229  . ferrous sulfate tablet 325 mg  325 mg Oral Q breakfast Enedina Finner, MD   325 mg at 09/11/19 0844  . HYDROcodone-acetaminophen (NORCO/VICODIN) 5-325 MG per tablet  1-2 tablet  1-2 tablet Oral Q4H PRN Enedina Finner, MD   2 tablet at 09/11/19 0845  . insulin aspart (novoLOG) injection 0-5 Units  0-5 Units Subcutaneous QHS Enedina Finner, MD   2 Units at 09/05/19 2200  . insulin aspart (novoLOG) injection 0-9 Units  0-9 Units Subcutaneous TID WC Enedina Finner, MD   2 Units at 09/11/19 0844  . lactulose (CHRONULAC) 10 GM/15ML solution 20 g  20 g Oral BID PRN Rolly Salter, MD   20 g at 09/08/19 1713  . losartan (COZAAR) tablet 50 mg  50 mg Oral Daily Enedina Finner, MD   50 mg at 09/11/19 0844  . methocarbamol (ROBAXIN) tablet 500 mg  500 mg Oral TID Rolly Salter, MD   500 mg at 09/11/19 1751  . morphine 2 MG/ML injection 2 mg  2 mg Intravenous Q4H PRN Enedina Finner, MD   2 mg at 09/07/19 0405  . multivitamin with minerals tablet 1 tablet  1 tablet Oral Daily Rolly Salter, MD   1 tablet at 09/11/19 [phone]  . polyethylene glycol (MIRALAX / GLYCOLAX) packet 17 g  17 g Oral Daily Rolly Salter, MD   17 g at 09/11/19 0844  . promethazine (PHENERGAN) injection 12.5-25 mg  12.5-25 mg Intravenous Q6H PRN Rolly Salter, MD  25 mg at 09/11/19 0844  . protein supplement (ENSURE MAX) liquid  11 oz Oral BID Rolly Salter, MD   11 oz at 09/11/19 0843  . senna-docusate (Senokot-S) tablet 2 tablet  2 tablet Oral BID Rolly Salter, MD   2 tablet at 09/11/19 [phone]  . sodium chloride flush (NS) 0.9 % injection 10-40 mL  10-40 mL Intracatheter PRN Enedina Finner, MD      . sodium chloride flush (NS) 0.9 % injection 5 mL  5 mL Intracatheter Q8H Richarda Overlie, MD   5 mL at 09/11/19 0443  . topiramate (TOPAMAX) tablet 100 mg  100 mg Oral BID Enedina Finner, MD   100 mg at 09/11/19 0844   Objective: Vital signs in last 24 hours: Temp:  [97.8 F (36.6 C)-98.2 F (36.8 C)] 97.9 F (36.6 C) (08/31 0534) Pulse Rate:  [84-94] 84 (08/31 0534) Resp:  [16-20] 20 (08/31 0534) BP: (105-111)/(68-73) 108/68 (08/31 0534) SpO2:  [97 %-99 %] 97 % (08/31 0534)  Intake/Output from previous  day: 08/30 0701 - 08/31 0700 In: 110.6 [IV Piggyback:100.6] Out: 1265 [Urine:1200; Drains:65] Intake/Output this shift: No intake/output data recorded.  Physical Exam Vitals and nursing note reviewed.  Constitutional:      General: She is not in acute distress.    Appearance: She is not ill-appearing, toxic-appearing or diaphoretic.  HENT:     Head: Normocephalic and atraumatic.  Pulmonary:     Effort: Pulmonary effort is normal. No respiratory distress.  Skin:    General: Skin is warm and dry.  Neurological:     Mental Status: She is alert and oriented to person, place, and time.  Psychiatric:        Mood and Affect: Mood normal.        Behavior: Behavior normal.    Lab Results:  Recent Labs    09/09/19 0739  WBC 7.3  HGB 8.6*  HCT 28.5*  PLT 274   BMET Recent Labs    09/09/19 0739  NA 135  K 3.9  CL 104  CO2 21*  GLUCOSE 204*  BUN 16  CREATININE 0.71  CALCIUM 8.6*   Studies/Results: CT RENAL STONE STUDY  Result Date: 09/09/2019 CLINICAL DATA:  CT-guided drain placed into a left renal abscess September 06, 2019. Sepsis. Drain output 150 cc in last 24 hours. Culture demonstrated coli. Patient on antibiotics. EXAM: CT ABDOMEN AND PELVIS WITHOUT CONTRAST TECHNIQUE: Multidetector CT imaging of the abdomen and pelvis was performed following the standard protocol without IV contrast. COMPARISON:  September 05, 2019 FINDINGS: Lower chest: Tiny left pleural effusion. Mild bibasilar atelectasis. The lower chest/lower lungs otherwise normal. Hepatobiliary: No focal liver abnormality is seen. No gallstones, gallbladder wall thickening, or biliary dilatation. Pancreas: Unremarkable. No pancreatic ductal dilatation or surrounding inflammatory changes. Spleen: Normal in size without focal abnormality. Adrenals/Urinary Tract: Adrenal glands are stable. Right kidney remains normal in appearance with no obstruction. The right ureter is normal. Persistent replacement of much of the left  kidney with fluid and gas. A pigtail catheter terminates within the superior periphery of this collection. The maximum dimension of this collection is 8.1 x 8.2 cm today versus 10.5 x 9.8 cm previously. There is preservation of the renal cortex in the lower pole. Fluid and stranding adjacent to the left kidney remains, similar in the interval. The fluid appears free and non loculated. The left ureter and bladder are normal. Stomach/Bowel: The stomach and small bowel are normal. Severe fecal loading throughout the length  of colon. The appendix is normal. Vascular/Lymphatic: No aneurysm or atherosclerotic change. Mild adenopathy in the left periaortic region, similar in the interval, likely reactive. No new adenopathy. Reproductive: Tubal ligation clips. Uterus and ovaries are otherwise unremarkable. Other: No abdominal wall hernia or abnormality. No abdominopelvic ascites. Musculoskeletal: No acute or significant osseous findings. IMPRESSION: 1. There is a pigtail drainage catheter along the superior periphery of the remaining abscess. The abscess appears smaller. Most of the remaining collection is inferior to the pigtail. 2. Tiny left pleural effusion, new in the interval. 3. Severe fecal loading throughout the colon. 4. Reactive adenopathy in the left periaortic region. 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  Labs continue to improve with continued drainage from her abscess.  Repeat imaging with diminishing size, however abscess has not yet resolved and does not appear to be loculated.  She will require continued drainage for the time being.  Recommendations: -Continue broad-spectrum antibiotics and follow abscess culture -Continue to  monitor drain output -Continued nausea control -Agree with IR recommendations for follow-up CTAP with contrast when drain output decreases to confirm improvement versus resolution of abscess  Carman Ching, PA-C 09/11/2019

## 2019-09-11 NOTE — Progress Notes (Signed)
Mobility Specialist - Progress Note   09/11/19 1505  Mobility  Activity Ambulated in room  Level of Assistance Independent  Assistive Device None  Distance Ambulated (ft) 160 ft  Mobility Response Tolerated well  Mobility performed by Mobility specialist  $Mobility charge 1 Mobility    Pre-mobility: 94 HR, 107/70 BP, 100% SpO2 During mobility: 126 HR, 94% SpO2 Post-mobility: 106 HR, 110/77 BP, 100% SpO2   Pt was sitting EOB upon arrival. Pt agreed to session. Pt denied any pain, but c/o nausea "3/10", however was not limited for mobility. Pt was independent in both sit-to-stand and while ambulating. Pt ambulated a total of 160' with noted LOBx1. Overall, pt tolerated session well. Pt was left EOB with phone/call bell in reach.    Filiberto Pinks Mobility Specialist 09/11/19, 3:08 PM

## 2019-09-11 NOTE — Progress Notes (Signed)
PROGRESS NOTE    Katelyn Barnes  CLE:[id number] DOB: 08/17/1980 DOA: 09/05/2019 PCP: [name], PA   Chief complaint.  Right flank pain.  Brief Narrative: Patient is a 39 year old female with history of essential hypertension, type 2 diabetes, previous emphysematous pyelitis and a left ureteral stricture, status post left renal stent. Patient came to urologist office complaining of left flank pain and fever.  CT scan showed left renal abscess.  Drain was placed on 8/26.  Her initial culture of the urine came back with E. Coli, blood culture negative.  Culture from drain still pending.  Currently covered with Rocephin.  8/31.  Renal drain culture grow E. coli, susceptibility pending.  Drain output 75 mL per 20-hour.  Continue Rocephin.    Assessment & Plan:   Active Problems:   Renal abscess   Left flank pain   Diabetes mellitus ([place])   Essential hypertension  #1.  Sepsis.  Secondary to E. coli renal abscess.  POA. Patient had a significant tachycardia, fever, tachypnea and acute kidney injury meet sepsis criteria.  #2.  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 Coli. Status post drain placement.  Drain output over 24 hours was 75 mL. Continue Rocephin.  Appreciate urology consult. We will need to repeat a CT scan when drain is ready to be removed.  #3.  Fecal impaction. Continue stool softener, give back a dose of lactulose again.  4.  Type 2 diabetes. Continue sliding insulin.  5.  Chronic anemia secondary to chronic disease. Iron, B12 level adequate.   DVT prophylaxis: Lovenox Code Status:Full Family Communication: None Disposition Plan:   Patient came from: Home                                                                                                                           Anticipated d/c place: Home  Barriers to d/c OR conditions which need to be met to effect a safe d/c:   Consultants:    Urology  Procedures: Drain of  renal abscess. Antimicrobials: Rocephin  Subjective: Patient doing well today.  Still complaining of left upper abdominal pain.  Had 3 bowel movements since yesterday, all small.  She is nauseous, no vomiting.  No fever or chills.  No short of breath or cough.   Objective: Vitals:   09/10/19 1151 09/10/19 2002 09/11/19 0534 09/11/19 1222  BP: 111/73 105/73 108/68 107/73  Pulse: 90 94 84 76  Resp: $Remo'16 20 20 18  'jHDxI$ Temp: 97.8 F (36.6 C) 98.2 F (36.8 C) 97.9 F (36.6 C) 97.9 F (36.6 C)  TempSrc: Oral     SpO2: 99% 99% 97% 98%  Weight:      Height:        Intake/Output Summary (Last 24 hours) at 09/11/2019 1319 Last data filed at 09/11/2019 0500 Gross per 24 hour  Intake 110.62 ml  Output 755 ml  Net -644.38 ml   Filed Weights   09/09/19 2120  Weight: 93.4 kg    Examination:  General exam: Appears calm and comfortable  Respiratory system: Clear to auscultation. Respiratory effort normal. Cardiovascular system: S1 & S2 heard, RRR. No JVD, murmurs, rubs, gallops or clicks. No pedal edema. Gastrointestinal system: Abdomen is nondistended, soft and LUQ tender. Normal bowel sounds heard. Central nervous system: Alert and oriented. No focal neurological deficits. Extremities: Symmetric  Skin: No rashes, lesions or ulcers Psychiatry: Judgement and insight appear normal. Mood & affect appropriate.     Data Reviewed: I have personally reviewed following labs and imaging studies  CBC: Recent Labs  Lab 09/06/19 0844 09/07/19 0426 09/09/19 0739  WBC 11.8* 8.8 7.3  NEUTROABS 8.8* 6.0  --   HGB 8.8* 8.7* 8.6*  HCT 28.1* 27.6* 28.5*  MCV 82.4 82.6 85.1  PLT 310 269 371   Basic Metabolic Panel: Recent Labs  Lab 09/06/19 0844 09/07/19 0426 09/09/19 0739 09/09/19 0747  NA 135 134* 135  --   K 3.5 3.5 3.9  --   CL 103 105 104  --   CO2 20* 21* 21*  --   GLUCOSE 210* 175* 204*  --   BUN $Re'15 13 16  'gLq$ --   CREATININE 0.90 0.75 0.71   --   CALCIUM 8.9 8.6* 8.6*  --   MG 1.5* 1.4*  --  1.8   GFR: Estimated Creatinine Clearance: 107.6 mL/min (by C-G formula based on SCr of 0.71 mg/dL). Liver Function Tests: Recent Labs  Lab 09/06/19 0844 09/07/19 0426  AST 12* 12*  ALT 12 11  ALKPHOS 63 57  BILITOT 1.3* 1.0  PROT 8.2* 7.5  ALBUMIN 2.9* 2.5*   No results for input(s): LIPASE, AMYLASE in the last 168 hours. No results for input(s): AMMONIA in the last 168 hours. Coagulation Profile: Recent Labs  Lab 09/06/19 0844  INR 1.3*   Cardiac Enzymes: No results for input(s): CKTOTAL, CKMB, CKMBINDEX, TROPONINI in the last 168 hours. BNP (last 3 results) No results for input(s): PROBNP in the last 8760 hours. HbA1C: No results for input(s): HGBA1C in the last 72 hours. CBG: Recent Labs  Lab 09/10/19 0825 09/10/19 1219 09/10/19 1630 09/10/19 2004 09/11/19 0800  GLUCAP 187* 208* 182* 177* 187*   Lipid Profile: No results for input(s): CHOL, HDL, LDLCALC, TRIG, CHOLHDL, LDLDIRECT in the last 72 hours. Thyroid Function Tests: No results for input(s): TSH, T4TOTAL, FREET4, T3FREE, THYROIDAB in the last 72 hours. Anemia Panel: Recent Labs    09/10/19 1100  VITAMINB12 356  FERRITIN 1,002*  TIBC 202*  IRON 37   Sepsis Labs: Recent Labs  Lab 09/05/19 1713  LATICACIDVEN 1.9    Recent Results (from the past 240 hour(s))  CULTURE, URINE COMPREHENSIVE     Status: Abnormal   Collection Time: 09/05/19  1:04 PM   Specimen: Urine   UR  Result Value Ref Range Status   Urine Culture, Comprehensive Final report (A)  Final   Organism ID, Bacteria Escherichia coli (A)  Final    Comment: Greater than 100,000 colony forming units per mL Cefazolin with an MIC <=16 predicts susceptibility to the oral agents cefaclor, cefdinir, cefpodoxime, cefprozil, cefuroxime, cephalexin, and loracarbef when used for therapy of uncomplicated urinary tract infections due to E. coli, Klebsiella pneumoniae, and  Proteus mirabilis.    ANTIMICROBIAL SUSCEPTIBILITY Comment  Final    Comment:       ** S = Susceptible; I = Intermediate; R = Resistant **  P = Positive; N = Negative             MICS are expressed in micrograms per mL    Antibiotic                 RSLT#1    RSLT#2    RSLT#3    RSLT#4 Amoxicillin/Clavulanic Acid    I Ampicillin                     R Cefazolin                      S Cefepime                       S Ceftriaxone                    S Cefuroxime                     S Ciprofloxacin                  S Ertapenem                      S Gentamicin                     S Imipenem                       S Levofloxacin                   S Meropenem                      S Nitrofurantoin                 S Piperacillin/Tazobactam        S Tetracycline                   S Tobramycin                     S Trimethoprim/Sulfa             R   Microscopic Examination     Status: Abnormal   Collection Time: 09/05/19  1:04 PM   Urine  Result Value Ref Range Status   WBC, UA 11-30 (A) 0 - 5 /hpf Final   RBC 3-10 (A) 0 - 2 /hpf Final   Epithelial Cells (non renal) 0-10 0 - 10 /hpf Final   Renal Epithel, UA 0-10 (A) None seen /hpf Final   Bacteria, UA Many (A) None seen/Few Final  Body fluid culture     Status: None   Collection Time: 09/05/19  4:51 PM   Specimen: Abdomen; Body Fluid  Result Value Ref Range Status   Specimen Description   Final    ABDOMEN Performed at [facility], [address], [place], [place] [address]    Special Requests   Final    NONE Performed at [facility], [address] [place], [place] [address]    Gram Stain   Final    FEW WBC PRESENT,BOTH PMN AND MONONUCLEAR FEW GRAM NEGATIVE RODS Performed at [facility], [place] [address] [place], [place] [address]    Culture FEW ESCHERICHIA COLI  Final   Report Status 09/08/2019 FINAL  Final  Organism ID, Bacteria ESCHERICHIA COLI  Final      Susceptibility    Escherichia coli - MIC*    AMPICILLIN >=32 RESISTANT Resistant     CEFAZOLIN 8 SENSITIVE Sensitive     CEFEPIME <=0.12 SENSITIVE Sensitive     CEFTAZIDIME <=1 SENSITIVE Sensitive     CEFTRIAXONE <=0.25 SENSITIVE Sensitive     CIPROFLOXACIN <=0.25 SENSITIVE Sensitive     GENTAMICIN <=1 SENSITIVE Sensitive     IMIPENEM <=0.25 SENSITIVE Sensitive     TRIMETH/SULFA >=320 RESISTANT Resistant     AMPICILLIN/SULBACTAM >=32 RESISTANT Resistant     PIP/TAZO 64 INTERMEDIATE Intermediate     * FEW ESCHERICHIA COLI  SARS Coronavirus 2 by RT PCR (hospital order, performed in [place] hospital lab) Nasopharyngeal Nasopharyngeal Swab     Status: None   Collection Time: 09/05/19  5:44 PM   Specimen: Nasopharyngeal Swab  Result Value Ref Range Status   SARS Coronavirus 2 NEGATIVE NEGATIVE Final    Comment: (NOTE) SARS-CoV-2 target nucleic acids are NOT DETECTED.  The SARS-CoV-2 RNA is generally detectable in upper and lower respiratory specimens during the acute phase of infection. The lowest concentration of SARS-CoV-2 viral copies this assay can detect is 250 copies / mL. A negative result does not preclude SARS-CoV-2 infection and should not be used as the sole basis for treatment or other patient management decisions.  A negative result may occur with improper specimen collection / handling, submission of specimen other than nasopharyngeal swab, presence of viral mutation(s) within the areas targeted by this assay, and inadequate number of viral copies (<250 copies / mL). A negative result must be combined with clinical observations, patient history, and epidemiological information.  Fact Sheet for Patients:   StrictlyIdeas.no  Fact Sheet for Healthcare Providers: BankingDealers.co.za  This test is not yet approved or  cleared by the [place] FDA and has been authorized for detection and/or diagnosis of SARS-CoV-2 by FDA under an  Emergency Use Authorization (EUA).  This EUA will remain in effect (meaning this test can be used) for the duration of the COVID-19 declaration under Section 564(b)(1) of the Act, 21 U.S.C. section 360bbb-3(b)(1), unless the authorization is terminated or revoked sooner.  Performed at [facility], [address] [place], [place] [address]   Urine Culture     Status: Abnormal   Collection Time: 09/05/19  6:30 PM   Specimen: Urine, Random  Result Value Ref Range Status   Specimen Description   Final    URINE, RANDOM Performed at [facility], [address] [place], [place] [address]    Special Requests   Final    NONE Performed at [facility], [place], [place] [address]    Culture >=100,000 COLONIES/mL ESCHERICHIA COLI (A)  Final   Report Status 09/07/2019 FINAL  Final   Organism ID, Bacteria ESCHERICHIA COLI (A)  Final      Susceptibility   Escherichia coli - MIC*    AMPICILLIN >=32 RESISTANT Resistant     CEFAZOLIN 8 SENSITIVE Sensitive     CEFTRIAXONE <=0.25 SENSITIVE Sensitive     CIPROFLOXACIN <=0.25 SENSITIVE Sensitive     GENTAMICIN <=1 SENSITIVE Sensitive     IMIPENEM <=0.25 SENSITIVE Sensitive     NITROFURANTOIN <=16 SENSITIVE Sensitive     TRIMETH/SULFA >=320 RESISTANT Resistant     AMPICILLIN/SULBACTAM >=32 RESISTANT Resistant     PIP/TAZO 8 SENSITIVE Sensitive     * >=100,000 COLONIES/mL ESCHERICHIA COLI  CULTURE, BLOOD (  ROUTINE X 2) w Reflex to ID Panel     Status: None   Collection Time: 09/06/19  8:44 AM   Specimen: BLOOD  Result Value Ref Range Status   Specimen Description BLOOD RIGHT FA  Final   Special Requests   Final    BOTTLES DRAWN AEROBIC AND ANAEROBIC Blood Culture adequate volume   Culture   Final    NO GROWTH 5 DAYS Performed at [facility], [address] [place], [place] [address]    Report Status 09/11/2019 FINAL  Final  CULTURE, BLOOD (ROUTINE X 2) w Reflex to ID Panel      Status: None   Collection Time: 09/06/19  8:53 AM   Specimen: BLOOD  Result Value Ref Range Status   Specimen Description BLOOD RIGHT HAND  Final   Special Requests   Final    BOTTLES DRAWN AEROBIC AND ANAEROBIC Blood Culture adequate volume   Culture   Final    NO GROWTH 5 DAYS Performed at [facility], [address] [place], [place] [address]    Report Status 09/11/2019 FINAL  Final  Aerobic/Anaerobic Culture (surgical/deep wound)     Status: None (Preliminary result)   Collection Time: 09/06/19  3:10 PM   Specimen: Abscess  Result Value Ref Range Status   Specimen Description   Final    ABSCESS Performed at [facility], [address] [place], [place] [address]    Special Requests LEFT RENAL  Final   Gram Stain   Final    FEW WBC PRESENT, PREDOMINANTLY PMN FEW GRAM NEGATIVE RODS Performed at [facility], [place] [address], [place], [place] [address]    Culture   Final    ABUNDANT ESCHERICHIA COLI SUSCEPTIBILITIES TO FOLLOW NO ANAEROBES ISOLATED; CULTURE IN PROGRESS FOR 5 DAYS    Report Status PENDING  Incomplete         Radiology Studies: CT RENAL STONE STUDY  Result Date: 09/09/2019 CLINICAL DATA:  CT-guided drain placed into a left renal abscess September 06, 2019. Sepsis. Drain output 150 cc in last 24 hours. Culture demonstrated coli. Patient on antibiotics. EXAM: CT ABDOMEN AND PELVIS WITHOUT CONTRAST TECHNIQUE: Multidetector CT imaging of the abdomen and pelvis was performed following the standard protocol without IV contrast. COMPARISON:  September 05, 2019 FINDINGS: Lower chest: Tiny left pleural effusion. Mild bibasilar atelectasis. The lower chest/lower lungs otherwise normal. Hepatobiliary: No focal liver abnormality is seen. No gallstones, gallbladder wall thickening, or biliary dilatation. Pancreas: Unremarkable. No pancreatic ductal dilatation or surrounding inflammatory changes. Spleen: Normal in size without focal abnormality.  Adrenals/Urinary Tract: Adrenal glands are stable. Right kidney remains normal in appearance with no obstruction. The right ureter is normal. Persistent replacement of much of the left kidney with fluid and gas. A pigtail catheter terminates within the superior periphery of this collection. The maximum dimension of this collection is 8.1 x 8.2 cm today versus 10.5 x 9.8 cm previously. There is preservation of the renal cortex in the lower pole. Fluid and stranding adjacent to the left kidney remains, similar in the interval. The fluid appears free and non loculated. The left ureter and bladder are normal. Stomach/Bowel: The stomach and small bowel are normal. Severe fecal loading throughout the length of colon. The appendix is normal. Vascular/Lymphatic: No aneurysm or atherosclerotic change. Mild adenopathy in the left periaortic region, similar in the interval, likely reactive. No new adenopathy. Reproductive: Tubal ligation clips. Uterus and ovaries are otherwise unremarkable. Other: No abdominal  wall hernia or abnormality. No abdominopelvic ascites. Musculoskeletal: No acute or significant osseous findings. IMPRESSION: 1. There is a pigtail drainage catheter along the superior periphery of the remaining abscess. The abscess appears smaller. Most of the remaining collection is inferior to the pigtail. 2. Tiny left pleural effusion, new in the interval. 3. Severe fecal loading throughout the colon. 4. Reactive adenopathy in the left periaortic region. Electronically Signed   By: Dorise Bullion III M.D   On: 09/09/2019 16:03        Scheduled Meds: . atorvastatin  40 mg Oral Daily  . enoxaparin (LOVENOX) injection  40 mg Subcutaneous Q24H  . ferrous sulfate  325 mg Oral Q breakfast  . insulin aspart  0-5 Units Subcutaneous QHS  . insulin aspart  0-9 Units Subcutaneous TID WC  . losartan  50 mg Oral Daily  . methocarbamol  500 mg Oral TID  . multivitamin with minerals  1 tablet Oral Daily  .  polyethylene glycol  17 g Oral Daily  . Ensure Max Protein  11 oz Oral BID  . senna-docusate  2 tablet Oral BID  . sodium chloride flush  5 mL Intracatheter Q8H  . topiramate  100 mg Oral BID   Continuous Infusions: . cefTRIAXone (ROCEPHIN)  IV Stopped (09/10/19 1820)     LOS: 6 days    Time spent: 28 minutes    Sharen Hones, MD [facility]   To contact the attending provider between 7A-7P or the covering provider during after hours 7P-7A, please log into the web site [email or web address] and access using universal [place] password for that web site. If you do not have the password, please call the hospital operator.  09/11/2019, 1:19 PM

## 2019-09-12 LAB — CBC WITH DIFFERENTIAL/PLATELET
Abs Immature Granulocytes: 0.09 10*3/uL — ABNORMAL HIGH (ref 0.00–0.07)
Basophils Absolute: 0 10*3/uL (ref 0.0–0.1)
Basophils Relative: 0 %
Eosinophils Absolute: 0.1 10*3/uL (ref 0.0–0.5)
Eosinophils Relative: 1 %
HCT: 29.7 % — ABNORMAL LOW (ref 36.0–46.0)
Hemoglobin: 9.2 g/dL — ABNORMAL LOW (ref 12.0–15.0)
Immature Granulocytes: 1 %
Lymphocytes Relative: 23 %
Lymphs Abs: 2.1 10*3/uL (ref 0.7–4.0)
MCH: 25.9 pg — ABNORMAL LOW (ref 26.0–34.0)
MCHC: 31 g/dL (ref 30.0–36.0)
MCV: 83.7 fL (ref 80.0–100.0)
Monocytes Absolute: 0.5 10*3/uL (ref 0.1–1.0)
Monocytes Relative: 6 %
Neutro Abs: 6.4 10*3/uL (ref 1.7–7.7)
Neutrophils Relative %: 69 %
Platelets: 313 10*3/uL (ref 150–400)
RBC: 3.55 MIL/uL — ABNORMAL LOW (ref 3.87–5.11)
RDW: 16.6 % — ABNORMAL HIGH (ref 11.5–15.5)
WBC: 9.3 10*3/uL (ref 4.0–10.5)
nRBC: 0 % (ref 0.0–0.2)

## 2019-09-12 LAB — BASIC METABOLIC PANEL
Anion gap: 8 (ref 5–15)
BUN: 28 mg/dL — ABNORMAL HIGH (ref 6–20)
CO2: 24 mmol/L (ref 22–32)
Calcium: 9.3 mg/dL (ref 8.9–10.3)
Chloride: 102 mmol/L (ref 98–111)
Creatinine, Ser: 0.85 mg/dL (ref 0.44–1.00)
GFR calc Af Amer: >60 mL/min (ref >60)
GFR calc non Af Amer: >60 mL/min (ref >60)
Glucose, Bld: 225 mg/dL — ABNORMAL HIGH (ref 70–99)
Potassium: 4.3 mmol/L (ref 3.5–5.1)
Sodium: 134 mmol/L — ABNORMAL LOW (ref 135–145)

## 2019-09-12 LAB — GLUCOSE, CAPILLARY
Glucose-Capillary: 187 mg/dL — ABNORMAL HIGH (ref 70–99)
Glucose-Capillary: 193 mg/dL — ABNORMAL HIGH (ref 70–99)
Glucose-Capillary: 283 mg/dL — ABNORMAL HIGH (ref 70–99)

## 2019-09-12 LAB — MAGNESIUM: Magnesium: 1.9 mg/dL (ref 1.7–2.4)

## 2019-09-12 MED ORDER — FLUCONAZOLE 50 MG PO TABS
150.0000 mg | ORAL_TABLET | Freq: Every day | ORAL | Status: AC
  Administered 2019-09-12 – 2019-09-13 (×2): 150 mg via ORAL
  Filled 2019-09-12 (×3): qty 1

## 2019-09-12 NOTE — Progress Notes (Signed)
PROGRESS NOTE    Katelyn Barnes  QIH:[id number] DOB: 1980/04/10 DOA: 09/05/2019 PCP: [name], PA   Chief complaint.  Right flank pain.  Brief Narrative: Patient is a 39 year old female with history of essential hypertension, type 2 diabetes, previous emphysematous pyelitis and a left ureteral stricture, status post left renal stent. Patient came to urologist office complaining of left flank pain and fever.  CT scan showed left renal abscess.  Drain was placed on 8/26.  Her initial culture of the urine came back with E. Coli, blood culture negative.  Culture from drain still pending.  Currently covered with Rocephin.  8/31.  Renal drain culture grow E. coli, susceptibility pending.  Drain output 75 mL per 20-hour.  Continue Rocephin.    Subjective: The patient was seen and examined this morning, remained stable, continues to complain of left flank pain, worsening with exertion  JP drain in place, draining pus material Remains afebrile normotensive     Assessment & Plan:   Active Problems:   Renal abscess   Left flank pain   Diabetes mellitus ([place])   Essential hypertension  Sepsis.  Secondary to E. coli renal abscess.  POA. Patient had a significant tachycardia, fever, tachypnea and acute kidney injury meet sepsis criteria. -Hemodynamically stable this morning; afebrile normotensive Improved leukocytosis -Currently on Rocephin, based on sensitivity anticipating switching to p.o. ciprofloxacin in a.m.   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 Coli. Status post drain placement.  Drain output over 24 hours was 50 mL. Continue Rocephin.   -Urology following closely--recommended continue antibiotics, - IR recommendations for follow-up CTAP with contrast when drain output decreases to confirm improvement versus resolution of abscess      Fecal impaction. -Improved Continue stool softener, as needed lactulose   Type 2  diabetes. Continue sliding insulin. Diabetic diet  Chronic anemia secondary to chronic disease. Iron, B12 level adequate. -Continue to monitor   DVT prophylaxis: Lovenox Code Status:Full Family Communication: None Disposition Plan:   Patient came from: Home                                                                                                                           Anticipated d/c place: Home  Barriers to d/c OR conditions which need to be met to effect a safe d/c:   Consultants:   Urology  Procedures: Drain of  renal abscess. Antimicrobials: Rocephin >>> due to sensitivity anticipating discharging on ciprofloxacin in a.m.     Objective: Vitals:   09/11/19 1222 09/11/19 2047 09/12/19 0548 09/12/19 1201  BP: 107/73 108/77 109/72 107/72  Pulse: 76 87 87 86  Resp: 18   20  Temp: 97.9 F (36.6 C) 97.8 F (36.6 C) 98.1 F (36.7 C) 97.9 F (36.6 C)  TempSrc:  Oral Oral Oral  SpO2: 98% 100% 98% 100%  Weight:      Height:        Intake/Output Summary (Last 24 hours)  at 09/12/2019 1421 Last data filed at 09/12/2019 1115 Gross per 24 hour  Intake 10 ml  Output 2335 ml  Net -2325 ml   Filed Weights   09/09/19 2120  Weight: 93.4 kg       Physical Exam:   General:  Alert, oriented, cooperative, no distress;   HEENT:  Normocephalic, PERRL, otherwise with in Normal limits   Neuro:  CNII-XII intact. , normal motor and sensation, reflexes intact   Lungs:   Clear to auscultation BL, Respirations unlabored, no wheezes / crackles  Cardio:    S1/S2, RRR, No murmure, No Rubs or Gallops   Abdomen:   Soft, non-tender, bowel sounds active all four quadrants,  no guarding or peritoneal signs.  Muscular skeletal:  Limited exam - in bed, able to move all 4 extremities, Normal strength,  2+ pulses,  symmetric, No pitting edema, CVA tenderness left greater than right  Skin:  Dry, warm to touch, negative for any Rashes, No open wounds  Wounds: Please see nursing  documentation Left flank Nephrox/abscess drain in place,              Data Reviewed: I have personally reviewed following labs and imaging studies  CBC: Recent Labs  Lab 09/06/19 0844 09/07/19 0426 09/09/19 0739 09/12/19 0509  WBC 11.8* 8.8 7.3 9.3  NEUTROABS 8.8* 6.0  --  6.4  HGB 8.8* 8.7* 8.6* 9.2*  HCT 28.1* 27.6* 28.5* 29.7*  MCV 82.4 82.6 85.1 83.7  PLT 310 269 274 962   Basic Metabolic Panel: Recent Labs  Lab 09/06/19 0844 09/07/19 0426 09/09/19 0739 09/09/19 0747 09/12/19 0509  NA 135 134* 135  --  134*  K 3.5 3.5 3.9  --  4.3  CL 103 105 104  --  102  CO2 20* 21* 21*  --  24  GLUCOSE 210* 175* 204*  --  225*  BUN _0 --  28*  CREATININE 0.90 0.75 0.71  --  0.85  CALCIUM 8.9 8.6* 8.6*  --  9.3  MG 1.5* 1.4*  --  1.8 1.9   GFR: Estimated Creatinine Clearance: 101.3 mL/min (by C-G formula based on SCr of 0.85 mg/dL). Liver Function Tests: Recent Labs  Lab 09/06/19 0844 09/07/19 0426  AST 12* 12*  ALT 12 11  ALKPHOS 63 57  BILITOT 1.3* 1.0  PROT 8.2* 7.5  ALBUMIN 2.9* 2.5*   No results for input(s): LIPASE, AMYLASE in the last 168 hours. No results for input(s): AMMONIA in the last 168 hours. Coagulation Profile: Recent Labs  Lab 09/06/19 0844  INR 1.3*   Cardiac Enzymes: No results for input(s): CKTOTAL, CKMB, CKMBINDEX, TROPONINI in the last 168 hours. BNP (last 3 results) No results for input(s): PROBNP in the last 8760 hours. HbA1C: No results for input(s): HGBA1C in the last 72 hours. CBG: Recent Labs  Lab 09/11/19 1219 09/11/19 1649 09/11/19 2045 09/12/19 0819 09/12/19 1158  GLUCAP 213* 181* 187* 193* 283*   Lipid Profile: No results for input(s): CHOL, HDL, LDLCALC, TRIG, CHOLHDL, LDLDIRECT in the last 72 hours. Thyroid Function Tests: No results for input(s): TSH, T4TOTAL, FREET4, T3FREE, THYROIDAB in the last 72 hours. Anemia Panel: Recent Labs    09/10/19 1100  VITAMINB12 356  FERRITIN 1,002*  TIBC 202*    IRON 37   Sepsis Labs: Recent Labs  Lab 09/05/19 1713  LATICACIDVEN 1.9    Recent Results (from the past 240 hour(s))  CULTURE, URINE COMPREHENSIVE     Status:  Abnormal   Collection Time: 09/05/19  1:04 PM   Specimen: Urine   UR  Result Value Ref Range Status   Urine Culture, Comprehensive Final report (A)  Final   Organism ID, Bacteria Escherichia coli (A)  Final    Comment: Greater than 100,000 colony forming units per mL Cefazolin with an MIC <=16 predicts susceptibility to the oral agents cefaclor, cefdinir, cefpodoxime, cefprozil, cefuroxime, cephalexin, and loracarbef when used for therapy of uncomplicated urinary tract infections due to E. coli, Klebsiella pneumoniae, and Proteus mirabilis.    ANTIMICROBIAL SUSCEPTIBILITY Comment  Final    Comment:       ** S = Susceptible; I = Intermediate; R = Resistant **                    P = Positive; N = Negative             MICS are expressed in micrograms per mL    Antibiotic                 RSLT#1    RSLT#2    RSLT#3    RSLT#4 Amoxicillin/Clavulanic Acid    I Ampicillin                     R Cefazolin                      S Cefepime                       S Ceftriaxone                    S Cefuroxime                     S Ciprofloxacin                  S Ertapenem                      S Gentamicin                     S Imipenem                       S Levofloxacin                   S Meropenem                      S Nitrofurantoin                 S Piperacillin/Tazobactam        S Tetracycline                   S Tobramycin                     S Trimethoprim/Sulfa             R   Microscopic Examination     Status: Abnormal   Collection Time: 09/05/19  1:04 PM   Urine  Result Value Ref Range Status   WBC, UA 11-30 (A) 0 - 5 /hpf Final   RBC 3-10 (A) 0 - 2 /hpf Final   Epithelial Cells (non renal) 0-10 0 - 10 /hpf Final   Renal Epithel, UA 0-10 (A) None seen /hpf Final  Bacteria, UA Many (A) None seen/Few Final   Body fluid culture     Status: None   Collection Time: 09/05/19  4:51 PM   Specimen: Abdomen; Body Fluid  Result Value Ref Range Status   Specimen Description   Final    ABDOMEN Performed at [facility], [address], [place], [place] [address]    Special Requests   Final    NONE Performed at [facility], [address] [place], [place] [address]    Gram Stain   Final    FEW WBC PRESENT,BOTH PMN AND MONONUCLEAR FEW GRAM NEGATIVE RODS Performed at [facility], [place] [address] [place], [place] [address]    Culture FEW ESCHERICHIA COLI  Final   Report Status 09/08/2019 FINAL  Final   Organism ID, Bacteria ESCHERICHIA COLI  Final      Susceptibility   Escherichia coli - MIC*    AMPICILLIN >=32 RESISTANT Resistant     CEFAZOLIN 8 SENSITIVE Sensitive     CEFEPIME <=0.12 SENSITIVE Sensitive     CEFTAZIDIME <=1 SENSITIVE Sensitive     CEFTRIAXONE <=0.25 SENSITIVE Sensitive     CIPROFLOXACIN <=0.25 SENSITIVE Sensitive     GENTAMICIN <=1 SENSITIVE Sensitive     IMIPENEM <=0.25 SENSITIVE Sensitive     TRIMETH/SULFA >=320 RESISTANT Resistant     AMPICILLIN/SULBACTAM >=32 RESISTANT Resistant     PIP/TAZO 64 INTERMEDIATE Intermediate     * FEW ESCHERICHIA COLI  SARS Coronavirus 2 by RT PCR (hospital order, performed in [facility] hospital lab) Nasopharyngeal Nasopharyngeal Swab     Status: None   Collection Time: 09/05/19  5:44 PM   Specimen: Nasopharyngeal Swab  Result Value Ref Range Status   SARS Coronavirus 2 NEGATIVE NEGATIVE Final    Comment: (NOTE) SARS-CoV-2 target nucleic acids are NOT DETECTED.  The SARS-CoV-2 RNA is generally detectable in upper and lower respiratory specimens during the acute phase of infection. The lowest concentration of SARS-CoV-2 viral copies this assay can detect is 250 copies / mL. A negative result does not preclude SARS-CoV-2 infection and should not be used as the sole basis for treatment or other patient  management decisions.  A negative result may occur with improper specimen collection / handling, submission of specimen other than nasopharyngeal swab, presence of viral mutation(s) within the areas targeted by this assay, and inadequate number of viral copies (<250 copies / mL). A negative result must be combined with clinical observations, patient history, and epidemiological information.  Fact Sheet for Patients:   StrictlyIdeas.no  Fact Sheet for Healthcare Providers: BankingDealers.co.za  This test is not yet approved or  cleared by the [place] FDA and has been authorized for detection and/or diagnosis of SARS-CoV-2 by FDA under an Emergency Use Authorization (EUA).  This EUA will remain in effect (meaning this test can be used) for the duration of the COVID-19 declaration under Section 564(b)(1) of the Act, 21 U.S.C. section 360bbb-3(b)(1), unless the authorization is terminated or revoked sooner.  Performed at [facility], [address], [place] [employer] [place], [place] [address]   Urine Culture     Status: Abnormal   Collection Time: 09/05/19  6:30 PM   Specimen: Urine, Random  Result Value Ref Range Status   Specimen Description   Final    URINE, RANDOM Performed at [facility], [address], [place], [place] [address]    Special Requests   Final    NONE Performed at [facility], [address] [place], [place] [address]    Culture >=100,000 COLONIES/mL ESCHERICHIA COLI (A)  Final   Report Status 09/07/2019 FINAL  Final   Organism ID, Bacteria ESCHERICHIA COLI (A)  Final      Susceptibility   Escherichia coli - MIC*    AMPICILLIN >=32 RESISTANT Resistant     CEFAZOLIN 8 SENSITIVE Sensitive     CEFTRIAXONE <=0.25 SENSITIVE Sensitive     CIPROFLOXACIN <=0.25 SENSITIVE Sensitive     GENTAMICIN <=1 SENSITIVE Sensitive     IMIPENEM <=0.25 SENSITIVE Sensitive     NITROFURANTOIN <=16 SENSITIVE  Sensitive     TRIMETH/SULFA >=320 RESISTANT Resistant     AMPICILLIN/SULBACTAM >=32 RESISTANT Resistant     PIP/TAZO 8 SENSITIVE Sensitive     * >=100,000 COLONIES/mL ESCHERICHIA COLI  CULTURE, BLOOD (ROUTINE X 2) w Reflex to ID Panel     Status: None   Collection Time: 09/06/19  8:44 AM   Specimen: BLOOD  Result Value Ref Range Status   Specimen Description BLOOD RIGHT FA  Final   Special Requests   Final    BOTTLES DRAWN AEROBIC AND ANAEROBIC Blood Culture adequate volume   Culture   Final    NO GROWTH 5 DAYS Performed at [facility], [address] [place], [place] [address]    Report Status 09/11/2019 FINAL  Final  CULTURE, BLOOD (ROUTINE X 2) w Reflex to ID Panel     Status: None   Collection Time: 09/06/19  8:53 AM   Specimen: BLOOD  Result Value Ref Range Status   Specimen Description BLOOD RIGHT HAND  Final   Special Requests   Final    BOTTLES DRAWN AEROBIC AND ANAEROBIC Blood Culture adequate volume   Culture   Final    NO GROWTH 5 DAYS Performed at [facility], [address], [place], [place] [address]    Report Status 09/11/2019 FINAL  Final  Aerobic/Anaerobic Culture (surgical/deep wound)     Status: None (Preliminary result)   Collection Time: 09/06/19  3:10 PM   Specimen: Abscess  Result Value Ref Range Status   Specimen Description   Final    ABSCESS Performed at [facility], [address] [place], [place] [address]    Special Requests LEFT RENAL  Final   Gram Stain   Final    FEW WBC PRESENT, PREDOMINANTLY PMN FEW GRAM NEGATIVE RODS Performed at [facility], [address], [place],  [address]    Culture   Final    ABUNDANT ESCHERICHIA COLI NO ANAEROBES ISOLATED; CULTURE IN PROGRESS FOR 5 DAYS    Report Status PENDING  Incomplete   Organism ID, Bacteria ESCHERICHIA COLI  Final      Susceptibility   Escherichia coli - MIC*    AMPICILLIN >=32 RESISTANT Resistant     CEFAZOLIN 8 SENSITIVE Sensitive       CEFEPIME <=0.12 SENSITIVE Sensitive     CEFTAZIDIME <=1 SENSITIVE Sensitive     CEFTRIAXONE <=0.25 SENSITIVE Sensitive     CIPROFLOXACIN <=0.25 SENSITIVE Sensitive     GENTAMICIN <=1 SENSITIVE Sensitive     IMIPENEM <=0.25 SENSITIVE Sensitive     TRIMETH/SULFA >=320 RESISTANT Resistant     AMPICILLIN/SULBACTAM >=32 RESISTANT Resistant     PIP/TAZO 16 SENSITIVE Sensitive     * ABUNDANT ESCHERICHIA COLI         Radiology Studies: No results found.      Scheduled Meds:  atorvastatin  40 mg Oral Daily  enoxaparin (LOVENOX) injection  40 mg Subcutaneous Q24H   ferrous sulfate  325 mg Oral Q breakfast   insulin aspart  0-5 Units Subcutaneous QHS   insulin aspart  0-9 Units Subcutaneous TID WC   losartan  50 mg Oral Daily   methocarbamol  500 mg Oral TID   multivitamin with minerals  1 tablet Oral Daily   polyethylene glycol  17 g Oral Daily   Ensure Max Protein  11 oz Oral BID   senna-docusate  2 tablet Oral BID   sodium chloride flush  5 mL Intracatheter Q8H   topiramate  100 mg Oral BID   Continuous Infusions:  cefTRIAXone (ROCEPHIN)  IV 2 g (09/11/19 1742)     LOS: 7 days    Time spent: 28 minutes    Deatra James, MD [facility]   To contact the attending provider between 7A-7P or the covering provider during after hours 7P-7A, please log into the web site [email or web address] and access using universal [place] password for that web site. If you do not have the password, please call the hospital operator.  09/12/2019, 2:21 PM

## 2019-09-12 NOTE — Progress Notes (Signed)
Mobility Specialist - Progress Note   09/12/19 1330  Mobility  Activity Ambulated in room;Ambulated in hall  Level of Assistance Independent (CGA for safety)  Assistive Device None  Distance Ambulated (ft) 360 ft  Mobility Response Tolerated well  Mobility performed by Mobility specialist  $Mobility charge 1 Mobility    Pre-mobility: 90 HR, 107/72 BP, 100% SpO2 Post-mobility: 93 HR, 102/75 BP, 100% SpO2   Pt was long-sitting in bed upon arrival. Pt agreed to mobility. Pt was having pain at drainage location, rating pain 5/10. Pain does not limit mobility. Pt able to ambulate 360' total in room and hallway independently. CGA utilized for safety precautions. Pain was consistent t/o session, pt rating pain 5/10 while ambulating. Overall, pt tolerated session well. Pt left sitting EOB w/ call bell and phone placed in reach. Nurse was notified.     Katelyn Barnes Mobility Specialist  09/12/19, 1:35 PM

## 2019-09-12 NOTE — Progress Notes (Signed)
PT Cancellation Note  Patient Details Name: Katelyn Barnes MRN: [id number] DOB: 1980/04/04   Cancelled Treatment:    Reason Eval/Treat Not Completed: Other (comment). Order received and chart reviewed. Per chart review, has ambulated several times with mobility specialist, 360' with independence. Spoke with RN who agreed that pt doesn't need formalized PT at this time and agreeable to continue mobility efforts with RN staff and mobility specialist. If needs arise, please re-order PT. Will cancel current PT order.   [name] 09/12/2019, 3:20 PM  Elizabeth Palau, PT, DPT [phone]

## 2019-09-13 ENCOUNTER — Telehealth: Payer: Self-pay

## 2019-09-13 DIAGNOSIS — N151 Renal and perinephric abscess: Secondary | ICD-10-CM

## 2019-09-13 LAB — GLUCOSE, CAPILLARY
Glucose-Capillary: 187 mg/dL — ABNORMAL HIGH (ref 70–99)
Glucose-Capillary: 196 mg/dL — ABNORMAL HIGH (ref 70–99)
Glucose-Capillary: 183 mg/dL — ABNORMAL HIGH (ref 70–99)
Glucose-Capillary: 221 mg/dL — ABNORMAL HIGH (ref 70–99)

## 2019-09-13 MED ORDER — HYDROCODONE-ACETAMINOPHEN 5-325 MG PO TABS: 1.0000 | ORAL_TABLET | Freq: Four times a day (QID) | ORAL | 0 refills | Status: AC | PRN

## 2019-09-13 MED ORDER — SODIUM CHLORIDE 0.9% FLUSH
10.0000 mL | Freq: Two times a day (BID) | INTRAVENOUS | 1 refills | Status: DC
Start: 2019-09-13 — End: 2019-09-28

## 2019-09-13 MED ORDER — ONDANSETRON HCL 4 MG PO TABS: 4.0000 mg | ORAL_TABLET | Freq: Every day | ORAL | 1 refills | Status: DC | PRN

## 2019-09-13 MED ORDER — FLUCONAZOLE 150 MG PO TABS: 150.0000 mg | ORAL_TABLET | Freq: Every day | ORAL | 0 refills | Status: AC

## 2019-09-13 MED ORDER — ACETAMINOPHEN 325 MG PO TABS: 650.0000 mg | ORAL_TABLET | Freq: Four times a day (QID) | ORAL | 0 refills | Status: AC | PRN

## 2019-09-13 MED ORDER — PROMETHAZINE HCL 12.5 MG PO TABS: 12.5000 mg | ORAL_TABLET | Freq: Four times a day (QID) | ORAL | 0 refills | Status: DC | PRN

## 2019-09-13 MED ORDER — LOSARTAN POTASSIUM 50 MG PO TABS: 50.0000 mg | ORAL_TABLET | Freq: Every day | ORAL | 0 refills | Status: AC

## 2019-09-13 MED ORDER — POLYETHYLENE GLYCOL 3350 17 G PO PACK
17.0000 g | PACK | Freq: Every day | ORAL | 0 refills | Status: DC
Start: 2019-09-13 — End: 2019-11-15

## 2019-09-13 MED ORDER — CIPROFLOXACIN HCL 500 MG PO TABS: 500.0000 mg | ORAL_TABLET | Freq: Two times a day (BID) | ORAL | 0 refills | Status: AC

## 2019-09-13 MED ORDER — ADULT MULTIVITAMIN W/MINERALS CH: 1.0000 | ORAL_TABLET | Freq: Every day | ORAL | 0 refills | Status: AC

## 2019-09-13 NOTE — Discharge Summary (Addendum)
Physician Discharge Summary [facility]    Patient: Katelyn Barnes                   Admit date: 09/05/2019   DOB: [birth year]             Discharge date:09/13/2019/9:39 AM HQP:[id number]                          PCP: Cathie Hoops, PA  Disposition: HOME with home health  Recommendations for Outpatient Follow-up:   . Follow up: in 2 week-urologist, interventional radiologist for reevaluation of renal abscess drain evaluation  Discharge Condition: Stable   Code Status:   Code Status: Full Code  Diet recommendation: Diabetic diet   Discharge Diagnoses:    Active Problems:   Renal abscess   Left flank pain   Diabetes mellitus (HCC)   Essential hypertension   History of Present Illness/ Hospital Course [name] Summary:   Patient is a 39 year old female with history of essential hypertension, type 2 diabetes, previous emphysematous pyelitis and a left ureteral stricture, status post left renal stent. Patient came to urologist office complaining of left flank pain and fever. CT scan showed left renal abscess. Drain was placed on 8/26.Her initial culture of the urine came back with E. Coli,blood culture negative. Culture from drain still pending. Currently covered with Rocephin.  8/31.  Renal drain culture grow E. coli, susceptibility pending.  Drain output 75 mL per 20-hour.  Continue Rocephin.    Sepsis.  Secondary to E. coli renal abscess.  POA. Patient had a significant tachycardia, fever, tachypnea - meet sepsis criteria. -Hemodynamically stable this morning; afebrile normotensive Improved leukocytosis -Currently on Rocephin, based on sensitivity anticipating switching to p.o. 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 Status post drain placement.  Drain output over 24 hours was 50 mL. Continue Rocephin.   -Urology following closely--recommended continue antibiotics, - IR  recommendations for follow-up CTAP with contrast when drain output decreases to confirm improvement versus resolution of abscess In 3-4 weeks      Fecal impaction. -Resolved continue as needed MiraLAX -May resume stool softeners as needed   Type 2 diabetes. Resume home regimen, diabetic diet  Chronic anemia secondary to chronic disease. Iron, B12 level adequate. -Continue to monitor    Code Status:Full  Disposition Plan:  Patient came from:Home  Anticipated d/c place:Home with home health    Consultants:  Urology---Stoioff, Verna Czech, MD   Procedures:Drain of renal abscess. Antimicrobials:Rocephin >>> due to sensitivity anticipating discharging to Ciprofloxacin     Nutritional status:  Nutrition Problem: Inadequate oral intake Etiology: acute illness Signs/Symptoms: per patient/family report     Discharge Instructions:   Discharge Instructions    Activity as tolerated - No restrictions   Complete by: As directed    Call MD for:  redness, tenderness, or signs of infection (pain, swelling, redness, odor or green/yellow discharge around incision site)   Complete by: As directed    Call MD for:  temperature >100.4   Complete by: As directed    Diet - low sodium heart healthy   Complete by: As directed    Diet Carb Modified   Complete by: As directed    Discharge instructions   Complete by: As directed    Follow-up with urologist-interventional radiologist for removal of the drain next 2-3 weeks Will need reimaging... Continue currently recommended antibiotics   Increase activity slowly  Complete by: As directed    Remove dressing in 48 hours   Complete by: As directed    Per wound care instructions       Medication List    TAKE these medications   acetaminophen 325 MG tablet Commonly known as: TYLENOL Take 2  tablets (650 mg total) by mouth every 6 (six) hours as needed for mild pain (or Fever >/= 101).   atorvastatin 40 MG tablet Commonly known as: LIPITOR Take by mouth.   ciprofloxacin 500 MG tablet Commonly known as: Cipro Take 1 tablet (500 mg total) by mouth 2 (two) times daily for 10 days.   ferrous sulfate 325 (65 FE) MG tablet Take by mouth.   fluconazole 150 MG tablet Commonly known as: DIFLUCAN Take 1 tablet (150 mg total) by mouth daily for 2 days.   HumuLIN 70/30 KwikPen (70-30) 100 UNIT/ML KwikPen Generic drug: insulin isophane & regular human Inject 40 Units into the skin 2 (two) times daily with a meal.   HYDROcodone-acetaminophen 5-325 MG tablet Commonly known as: NORCO/VICODIN Take 1-2 tablets by mouth every 6 (six) hours as needed for up to 5 days for moderate pain.   losartan 50 MG tablet Commonly known as: COZAAR Take 1 tablet (50 mg total) by mouth daily. What changed: how much to take   metFORMIN 500 MG tablet Commonly known as: GLUCOPHAGE Take 500 mg by mouth 2 (two) times daily.   multivitamin with minerals Tabs tablet Take 1 tablet by mouth daily.   polyethylene glycol 17 g packet Commonly known as: MIRALAX / GLYCOLAX Take 17 g by mouth daily.   topiramate 25 MG tablet Commonly known as: TOPAMAX 4 Tablet(s) By Mouth Twice Daily       Allergies  Allergen Reactions  . Penicillins Rash    14 years ago   . Macrobid [Nitrofurantoin] Nausea And Vomiting     Procedures /Studies:   DG Chest 2 View  Result Date: 08/26/2019 CLINICAL DATA:  Chest pain and dizziness 2 months. EXAM: CHEST - 2 VIEW COMPARISON:  06/14/2019 FINDINGS: Lungs are adequately inflated without consolidation or effusion. Cardiomediastinal silhouette and remainder the exam is unchanged. IMPRESSION: No active cardiopulmonary disease. Electronically Signed   By: Elberta Fortis M.D.   On: 08/26/2019 17:50   CT GUIDED NEEDLE PLACEMENT  Result Date: 09/06/2019 INDICATION:  39 year old with large left renal/perirenal abscess. EXAM: CT-GUIDED DRAINAGE OF LEFT RENAL ABSCESS LIMITED ULTRASOUND EVALUATION OF THE LEFT KIDNEY MEDICATIONS: The patient is currently admitted to the hospital and receiving intravenous antibiotics. The antibiotics were administered within an appropriate time frame prior to the initiation of the procedure. ANESTHESIA/SEDATION: Fentanyl 50 mcg IV; Versed 1.0 mg IV Moderate Sedation Time:  14 minutes The patient was continuously monitored during the procedure by the interventional radiology nurse under my direct supervision. COMPLICATIONS: None immediate. PROCEDURE: Informed written consent was obtained from the patient after a thorough discussion of the procedural risks, benefits and alternatives. All questions were addressed. A timeout was performed prior to the initiation of the procedure. Patient was placed prone on the CT scanner. Initially, the left kidney was evaluated with ultrasound. Ultrasound demonstrated a complex hypoechoic collection involving the left kidney with internal gas. Limited evaluation of the left kidney due to the large amount of gas within the collection. As a result, ultrasound was not used for drain placement. CT images of the left kidney were obtained. The renal/perirenal abscess was targeted along the lateral aspect. The overlying skin was prepped with chlorhexidine  and sterile field was created. Maximal barrier sterile technique was utilized including caps, mask, sterile gowns, sterile gloves, sterile drape, hand hygiene and skin antiseptic. Skin was anesthetized using 1% lidocaine. Using CT guidance, an 18 gauge trocar needle was directed into the large renal abscess and yellow purulent fluid was aspirated. Superstiff Amplatz wire was advanced into the collection. The tract was dilated to accommodate a 12 [place] multipurpose drain. Approximately 320 mL of thick yellow purulent fluid was removed. Sample was sent for culture. Catheter  was attached to a gravity bag and sutured in place. FINDINGS: Large complex perirenal/renal abscess involving the left kidney. Multiple air-fluid levels present. Following placement of the 25 French drain, greater than 300 mL of purulent fluid was removed. Post drain CT images demonstrate that majority of the fluid was removed with residual gas-filled pockets involving the left kidney. IMPRESSION: 1. CT-guided placement of a drainage catheter within the left renal/perirenal abscess. Greater than 300 mL of purulent fluid was removed. 2. Recommend follow-up CT imaging with IV contrast when the drain output decreases. Electronically Signed   By: Richarda Overlie M.D.   On: 09/06/2019 15:27   [place] Intraoperative  Result Date: 09/06/2019 INDICATION: 39 year old with large left renal/perirenal abscess. EXAM: CT-GUIDED DRAINAGE OF LEFT RENAL ABSCESS LIMITED ULTRASOUND EVALUATION OF THE LEFT KIDNEY MEDICATIONS: The patient is currently admitted to the hospital and receiving intravenous antibiotics. The antibiotics were administered within an appropriate time frame prior to the initiation of the procedure. ANESTHESIA/SEDATION: Fentanyl 50 mcg IV; Versed 1.0 mg IV Moderate Sedation Time:  14 minutes The patient was continuously monitored during the procedure by the interventional radiology nurse under my direct supervision. COMPLICATIONS: None immediate. PROCEDURE: Informed written consent was obtained from the patient after a thorough discussion of the procedural risks, benefits and alternatives. All questions were addressed. A timeout was performed prior to the initiation of the procedure. Patient was placed prone on the CT scanner. Initially, the left kidney was evaluated with ultrasound. Ultrasound demonstrated a complex hypoechoic collection involving the left kidney with internal gas. Limited evaluation of the left kidney due to the large amount of gas within the collection. As a result, ultrasound was not used for drain  placement. CT images of the left kidney were obtained. The renal/perirenal abscess was targeted along the lateral aspect. The overlying skin was prepped with chlorhexidine and sterile field was created. Maximal barrier sterile technique was utilized including caps, mask, sterile gowns, sterile gloves, sterile drape, hand hygiene and skin antiseptic. Skin was anesthetized using 1% lidocaine. Using CT guidance, an 18 gauge trocar needle was directed into the large renal abscess and yellow purulent fluid was aspirated. Superstiff Amplatz wire was advanced into the collection. The tract was dilated to accommodate a 12 [place] multipurpose drain. Approximately 320 mL of thick yellow purulent fluid was removed. Sample was sent for culture. Catheter was attached to a gravity bag and sutured in place. FINDINGS: Large complex perirenal/renal abscess involving the left kidney. Multiple air-fluid levels present. Following placement of the 67 French drain, greater than 300 mL of purulent fluid was removed. Post drain CT images demonstrate that majority of the fluid was removed with residual gas-filled pockets involving the left kidney. IMPRESSION: 1. CT-guided placement of a drainage catheter within the left renal/perirenal abscess. Greater than 300 mL of purulent fluid was removed. 2. Recommend follow-up CT imaging with IV contrast when the drain output decreases. Electronically Signed   By: Richarda Overlie M.D.   On: 09/06/2019 15:27  US RENAL  Result Date: 08/15/2019 CLINICAL DATA:  History of hydronephrosis EXAM: RENAL / URINARY TRACT ULTRASOUND COMPLETE COMPARISON:  Ultrasound 06/13/2019, CT 06/11/2019 FINDINGS: Right Kidney: Renal measurements: 13.2 x 6.8 x 7 cm = volume: 328.7 mL . Echogenicity within normal limits. No mass or hydronephrosis visualized. Left Kidney: Renal measurements: 15.5 x 8 x 8.9 cm = volume: 574.4 mL. Diffuse echogenic shadowing at the renal collecting system suggestive of air within the collecting  system. This limits evaluation for hydronephrosis. Possible interval complex fluid collection at the lower pole of the left kidney, poorly defined. Bladder: Debris within the posterior bladder. Prevoid volume of 198.6 CC, postvoid volume of 23.1 cc. Other: None. IMPRESSION: 1. Diffuse echogenic shadowing at the left renal collecting system suspicious for gas within the collecting system, this limits evaluation for hydronephrosis. Consider correlation with CT. Possible development of small complex fluid collection at the lower pole of left kidney. This could also be evaluated at CT follow-up. 2. Both kidneys are generous in size. There is no right hydronephrosis. These results will be called to the ordering clinician or representative by the Radiologist Assistant, and communication documented in the PACS or [employer]. Electronically Signed   By: Jasmine Pang M.D.   On: 08/15/2019 00:09   DG Abd Portable 1V  Result Date: 09/08/2019 CLINICAL DATA:  Abdominal pain. Patient reports pain at catheter site. Constipation. EXAM: PORTABLE ABDOMEN - 1 VIEW COMPARISON:  Noncontrast CT 09/05/2019 FINDINGS: Left nephrostomy tube in place. Renal parenchymal air is seen on recent CT. Moderate stool in the ascending colon, small volume of stool in the descending colon. Air-filled sigmoid colon. No evidence of obstruction or small bowel dilatation. Tubal ligation clips in the pelvis. No radiopaque calculi. IMPRESSION: 1. Left nephrostomy tube in place. 2. Nonobstructive bowel gas pattern. Moderate stool in the proximal colon. Electronically Signed   By: Narda Rutherford M.D.   On: 09/08/2019 19:58   CT RENAL STONE STUDY  Result Date: 09/09/2019 CLINICAL DATA:  CT-guided drain placed into a left renal abscess September 06, 2019. Sepsis. Drain output 150 cc in last 24 hours. Culture demonstrated coli. Patient on antibiotics. EXAM: CT ABDOMEN AND PELVIS WITHOUT CONTRAST TECHNIQUE: Multidetector CT imaging of the abdomen and  pelvis was performed following the standard protocol without IV contrast. COMPARISON:  September 05, 2019 FINDINGS: Lower chest: Tiny left pleural effusion. Mild bibasilar atelectasis. The lower chest/lower lungs otherwise normal. Hepatobiliary: No focal liver abnormality is seen. No gallstones, gallbladder wall thickening, or biliary dilatation. Pancreas: Unremarkable. No pancreatic ductal dilatation or surrounding inflammatory changes. Spleen: Normal in size without focal abnormality. Adrenals/Urinary Tract: Adrenal glands are stable. Right kidney remains normal in appearance with no obstruction. The right ureter is normal. Persistent replacement of much of the left kidney with fluid and gas. A pigtail catheter terminates within the superior periphery of this collection. The maximum dimension of this collection is 8.1 x 8.2 cm today versus 10.5 x 9.8 cm previously. There is preservation of the renal cortex in the lower pole. Fluid and stranding adjacent to the left kidney remains, similar in the interval. The fluid appears free and non loculated. The left ureter and bladder are normal. Stomach/Bowel: The stomach and small bowel are normal. Severe fecal loading throughout the length of colon. The appendix is normal. Vascular/Lymphatic: No aneurysm or atherosclerotic change. Mild adenopathy in the left periaortic region, similar in the interval, likely reactive. No new adenopathy. Reproductive: Tubal ligation clips. Uterus and ovaries are otherwise unremarkable. Other:  No abdominal wall hernia or abnormality. No abdominopelvic ascites. Musculoskeletal: No acute or significant osseous findings. IMPRESSION: 1. There is a pigtail drainage catheter along the superior periphery of the remaining abscess. The abscess appears smaller. Most of the remaining collection is inferior to the pigtail. 2. Tiny left pleural effusion, new in the interval. 3. Severe fecal loading throughout the colon. 4. Reactive adenopathy in the left  periaortic region. Electronically Signed   By: Gerome Sam III M.D   On: 09/09/2019 16:03   CT RENAL STONE STUDY  Result Date: 09/05/2019 CLINICAL DATA:  Left flank pain with nausea and chills for 3 days. History of left urinary tract obstruction with stent approximately 2-3 months ago. EXAM: CT ABDOMEN AND PELVIS WITHOUT CONTRAST TECHNIQUE: Multidetector CT imaging of the abdomen and pelvis was performed following the standard protocol without IV contrast. COMPARISON:  08/14/2019 renal ultrasound.  Most recent CT 06/11/2019 FINDINGS: Lower chest: Clear lung bases. Normal heart size without pericardial or pleural effusion. Hepatobiliary: Normal liver. Normal gallbladder, without biliary ductal dilatation. Pancreas: Normal, without mass or ductal dilatation. Spleen: Normal in size, without focal abnormality. Adrenals/Urinary Tract: Normal right adrenal gland. The left adrenal is poorly evaluated. Normal right kidney, without stone, hydronephrosis. No bladder calculi. Significant progression of left renal infection. Normal renal morphology is essentially replaced (small amount of retained parenchyma is suspected in the lower pole left kidney on 48/2) by a gas and fluid collection, most consistent with abscess. Example 9.8 x 10.5 cm on 35/2. 12.3 cm craniocaudal on 77 coronal. Displacement of the descending colon laterally and adjacent small bowel loops minimally anteriorly. No hydroureter or ureteric stone. Stomach/Bowel: Normal stomach, without wall thickening. The descending colon is intimately associated with the left renal process, without well-defined fistulous communication identified. Example 47/2. Normal terminal ileum and appendix. Normal small bowel. Vascular/Lymphatic: Aortic atherosclerosis. Left periaortic 1.0 cm node on 39/2 is similar, likely reactive. No pelvic sidewall adenopathy. Reproductive: Normal uterus and adnexa.  Tubal ligation. Other: No significant free fluid. Mild pelvic floor  laxity. No free intraperitoneal air. Musculoskeletal: Lumbar spondylosis. IMPRESSION: 1. 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 2. Borderline abdominal adenopathy, likely reactive. 3.  Aortic Atherosclerosis (ICD10-I70.0).  This is age advanced. These results will be called to the ordering clinician or representative by the Radiologist Assistant, and communication documented in the PACS or [employer]. Electronically Signed   By: Jeronimo Greaves M.D.   On: 09/05/2019 14:53    Subjective:   Patient was seen and examined 09/13/2019, 9:39 AM Patient stable today. No acute distress.  No issues overnight Stable for discharge.  Discharge Exam:    Vitals:   09/12/19 0548 09/12/19 1201 09/12/19 2041 09/13/19 0534  BP: 109/72 107/72 97/71 103/70  Pulse: 87 86 97 83  Resp:  20    Temp: 98.1 F (36.7 C) 97.9 F (36.6 C) 98.7 F (37.1 C) 98.3 F (36.8 C)  TempSrc: Oral Oral Oral Oral  SpO2: 98% 100% 100% 99%  Weight:      Height:        General: Pt lying comfortably in bed & appears in no obvious distress. Cardiovascular: S1 & S2 heard, RRR, S1/S2 +. No murmurs, rubs, gallops or clicks. No JVD or pedal edema. Respiratory: Clear to auscultation without wheezing, rhonchi or crackles. No increased work of breathing. Abdominal:  Non-distended, non-tender & soft. No organomegaly  or masses appreciated. Normal bowel sounds heard. CNS: Alert and oriented. No focal deficits. Extremities: no edema, no cyanosis    The results of significant diagnostics from this hospitalization (including imaging, microbiology, ancillary and laboratory) are listed below for reference.      Microbiology:   Recent Results (from the past 240 hour(s))  CULTURE, URINE COMPREHENSIVE     Status: Abnormal   Collection Time: 09/05/19  1:04 PM    Specimen: Urine   UR  Result Value Ref Range Status   Urine Culture, Comprehensive Final report (A)  Final   Organism ID, Bacteria Escherichia coli (A)  Final    Comment: Greater than 100,000 colony forming units per mL Cefazolin with an MIC <=16 predicts susceptibility to the oral agents cefaclor, cefdinir, cefpodoxime, cefprozil, cefuroxime, cephalexin, and loracarbef when used for therapy of uncomplicated urinary tract infections due to E. coli, Klebsiella pneumoniae, and Proteus mirabilis.    ANTIMICROBIAL SUSCEPTIBILITY Comment  Final    Comment:       ** S = Susceptible; I = Intermediate; R = Resistant **                    P = Positive; N = Negative             MICS are expressed in micrograms per mL    Antibiotic                 RSLT#1    RSLT#2    RSLT#3    RSLT#4 Amoxicillin/Clavulanic Acid    I Ampicillin                     R Cefazolin                      S Cefepime                       S Ceftriaxone                    S Cefuroxime                     S Ciprofloxacin                  S Ertapenem                      S Gentamicin                     S Imipenem                       S Levofloxacin                   S Meropenem                      S Nitrofurantoin                 S Piperacillin/Tazobactam        S Tetracycline                   S Tobramycin                     S Trimethoprim/Sulfa             R   Microscopic Examination  Status: Abnormal   Collection Time: 09/05/19  1:04 PM   Urine  Result Value Ref Range Status   WBC, UA 11-30 (A) 0 - 5 /hpf Final   RBC 3-10 (A) 0 - 2 /hpf Final   Epithelial Cells (non renal) 0-10 0 - 10 /hpf Final   Renal Epithel, UA 0-10 (A) None seen /hpf Final   Bacteria, UA Many (A) None seen/Few Final  Body fluid culture     Status: None   Collection Time: 09/05/19  4:51 PM   Specimen: Abdomen; Body Fluid  Result Value Ref Range Status   Specimen Description   Final    ABDOMEN Performed at [facility],  [address] [place], [place] [address]    Special Requests   Final    NONE Performed at [facility], [address] [place], [place] [address]    Gram Stain   Final    FEW WBC PRESENT,BOTH PMN AND MONONUCLEAR FEW GRAM NEGATIVE RODS Performed at [facility], [address] [place], [place] [address]    Culture FEW ESCHERICHIA COLI  Final   Report Status 09/08/2019 FINAL  Final   Organism ID, Bacteria ESCHERICHIA COLI  Final      Susceptibility   Escherichia coli - MIC*    AMPICILLIN >=32 RESISTANT Resistant     CEFAZOLIN 8 SENSITIVE Sensitive     CEFEPIME <=0.12 SENSITIVE Sensitive     CEFTAZIDIME <=1 SENSITIVE Sensitive     CEFTRIAXONE <=0.25 SENSITIVE Sensitive     CIPROFLOXACIN <=0.25 SENSITIVE Sensitive     GENTAMICIN <=1 SENSITIVE Sensitive     IMIPENEM <=0.25 SENSITIVE Sensitive     TRIMETH/SULFA >=320 RESISTANT Resistant     AMPICILLIN/SULBACTAM >=32 RESISTANT Resistant     PIP/TAZO 64 INTERMEDIATE Intermediate     * FEW ESCHERICHIA COLI  SARS Coronavirus 2 by RT PCR (hospital order, performed in [facility] hospital lab) Nasopharyngeal Nasopharyngeal Swab     Status: None   Collection Time: 09/05/19  5:44 PM   Specimen: Nasopharyngeal Swab  Result Value Ref Range Status   SARS Coronavirus 2 NEGATIVE NEGATIVE Final    Comment: (NOTE) SARS-CoV-2 target nucleic acids are NOT DETECTED.  The SARS-CoV-2 RNA is generally detectable in upper and lower respiratory specimens during the acute phase of infection. The lowest concentration of SARS-CoV-2 viral copies this assay can detect is 250 copies / mL. A negative result does not preclude SARS-CoV-2 infection and should not be used as the sole basis for treatment or other patient management decisions.  A negative result may occur with improper specimen collection / handling, submission of specimen other than nasopharyngeal swab, presence of viral mutation(s) within the areas targeted by this assay,  and inadequate number of viral copies (<250 copies / mL). A negative result must be combined with clinical observations, patient history, and epidemiological information.  Fact Sheet for Patients:   BoilerBrush.com.cyhttps://www.fda.gov/media/136312/download  Fact Sheet for Healthcare Providers: https://pope.com/https://www.fda.gov/media/136313/download  This test is not yet approved or  cleared by the [place] FDA and has been authorized for detection and/or diagnosis of SARS-CoV-2 by FDA under an Emergency Use Authorization (EUA).  This EUA will remain in effect (meaning this test can be used) for the duration of the COVID-19 declaration under Section 564(b)(1) of the Act, 21 U.S.C. section 360bbb-3(b)(1), unless the authorization is terminated or revoked sooner.  Performed at [facility] [facility], [address] [place], [place] [id number]   Urine Culture  Status: Abnormal   Collection Time: 09/05/19  6:30 PM   Specimen: Urine, Random  Result Value Ref Range Status   Specimen Description   Final    URINE, RANDOM Performed at [facility], [address] [place], [place] [address]    Special Requests   Final    NONE Performed at [facility], [address] [place], [place] [address]    Culture >=100,000 COLONIES/mL ESCHERICHIA COLI (A)  Final   Report Status 09/07/2019 FINAL  Final   Organism ID, Bacteria ESCHERICHIA COLI (A)  Final      Susceptibility   Escherichia coli - MIC*    AMPICILLIN >=32 RESISTANT Resistant     CEFAZOLIN 8 SENSITIVE Sensitive     CEFTRIAXONE <=0.25 SENSITIVE Sensitive     CIPROFLOXACIN <=0.25 SENSITIVE Sensitive     GENTAMICIN <=1 SENSITIVE Sensitive     IMIPENEM <=0.25 SENSITIVE Sensitive     NITROFURANTOIN <=16 SENSITIVE Sensitive     TRIMETH/SULFA >=320 RESISTANT Resistant     AMPICILLIN/SULBACTAM >=32 RESISTANT Resistant     PIP/TAZO 8 SENSITIVE Sensitive     * >=100,000 COLONIES/mL ESCHERICHIA COLI  CULTURE, BLOOD (ROUTINE X 2) w Reflex  to ID Panel     Status: None   Collection Time: 09/06/19  8:44 AM   Specimen: BLOOD  Result Value Ref Range Status   Specimen Description BLOOD RIGHT FA  Final   Special Requests   Final    BOTTLES DRAWN AEROBIC AND ANAEROBIC Blood Culture adequate volume   Culture   Final    NO GROWTH 5 DAYS Performed at [facility], [address] [place], [place] [address]    Report Status 09/11/2019 FINAL  Final  CULTURE, BLOOD (ROUTINE X 2) w Reflex to ID Panel     Status: None   Collection Time: 09/06/19  8:53 AM   Specimen: BLOOD  Result Value Ref Range Status   Specimen Description BLOOD RIGHT HAND  Final   Special Requests   Final    BOTTLES DRAWN AEROBIC AND ANAEROBIC Blood Culture adequate volume   Culture   Final    NO GROWTH 5 DAYS Performed at [facility], [address], [place], [place] [address]    Report Status 09/11/2019 FINAL  Final  Aerobic/Anaerobic Culture (surgical/deep wound)     Status: None (Preliminary result)   Collection Time: 09/06/19  3:10 PM   Specimen: Abscess  Result Value Ref Range Status   Specimen Description   Final    ABSCESS Performed at [facility], [address] [place], [place] [address]    Special Requests LEFT RENAL  Final   Gram Stain   Final    FEW WBC PRESENT, PREDOMINANTLY PMN FEW GRAM NEGATIVE RODS Performed at [facility], [address], [place], [place] [address]    Culture   Final    ABUNDANT ESCHERICHIA COLI NO ANAEROBES ISOLATED; CULTURE IN PROGRESS FOR 5 DAYS    Report Status PENDING  Incomplete   Organism ID, Bacteria ESCHERICHIA COLI  Final      Susceptibility   Escherichia coli - MIC*    AMPICILLIN >=32 RESISTANT Resistant     CEFAZOLIN 8 SENSITIVE Sensitive     CEFEPIME <=0.12 SENSITIVE Sensitive     CEFTAZIDIME <=1 SENSITIVE Sensitive     CEFTRIAXONE <=0.25 SENSITIVE Sensitive     CIPROFLOXACIN <=0.25 SENSITIVE Sensitive     GENTAMICIN <=1 SENSITIVE Sensitive     IMIPENEM  <=  0.25 SENSITIVE Sensitive     TRIMETH/SULFA >=320 RESISTANT Resistant     AMPICILLIN/SULBACTAM >=32 RESISTANT Resistant     PIP/TAZO 16 SENSITIVE Sensitive     * ABUNDANT ESCHERICHIA COLI     Labs:   CBC: Recent Labs  Lab 09/07/19 0426 09/09/19 0739 09/12/19 0509  WBC 8.8 7.3 9.3  NEUTROABS 6.0  --  6.4  HGB 8.7* 8.6* 9.2*  HCT 27.6* 28.5* 29.7*  MCV 82.6 85.1 83.7  PLT 269 274 313   Basic Metabolic Panel: Recent Labs  Lab 09/07/19 0426 09/09/19 0739 09/09/19 0747 09/12/19 0509  NA 134* 135  --  134*  K 3.5 3.9  --  4.3  CL 105 104  --  102  CO2 21* 21*  --  24  GLUCOSE 175* 204*  --  225*  BUN 13 16  --  28*  CREATININE 0.75 0.71  --  0.85  CALCIUM 8.6* 8.6*  --  9.3  MG 1.4*  --  1.8 1.9   Liver Function Tests: Recent Labs  Lab 09/07/19 0426  AST 12*  ALT 11  ALKPHOS 57  BILITOT 1.0  PROT 7.5  ALBUMIN 2.5*   BNP (last 3 results) No results for input(s): BNP in the last 8760 hours. Cardiac Enzymes: No results for input(s): CKTOTAL, CKMB, CKMBINDEX, TROPONINI in the last 168 hours. CBG: Recent Labs  Lab 09/12/19 0819 09/12/19 1158 09/12/19 1640 09/12/19 2040 09/13/19 0729  GLUCAP 193* 283* 187* 196* 183*   Hgb A1c No results for input(s): HGBA1C in the last 72 hours. Lipid Profile No results for input(s): CHOL, HDL, LDLCALC, TRIG, CHOLHDL, LDLDIRECT in the last 72 hours. Thyroid function studies No results for input(s): TSH, T4TOTAL, T3FREE, THYROIDAB in the last 72 hours.  Invalid input(s): FREET3 Anemia work up Recent Labs    09/10/19 1100  VITAMINB12 356  FERRITIN 1,002*  TIBC 202*  IRON 37   Urinalysis    Component Value Date/Time   COLORURINE YELLOW (A) 06/11/2019 0859   APPEARANCEUR Cloudy (A) 09/05/2019 1304   LABSPEC 1.015 06/11/2019 0859   LABSPEC 1.037 05/27/2013 1839   PHURINE 5.0 06/11/2019 0859   GLUCOSEU Negative 09/05/2019 1304   GLUCOSEU >=500 05/27/2013 1839   HGBUR MODERATE (A) 06/11/2019 0859   BILIRUBINUR  Negative 09/05/2019 1304   BILIRUBINUR Negative 05/27/2013 1839   KETONESUR 5 (A) 06/11/2019 0859   PROTEINUR 1+ (A) 09/05/2019 1304   PROTEINUR 100 (A) 06/11/2019 0859   NITRITE Negative 09/05/2019 1304   NITRITE NEGATIVE 06/11/2019 0859   LEUKOCYTESUR 1+ (A) 09/05/2019 1304   LEUKOCYTESUR TRACE (A) 06/11/2019 0859   LEUKOCYTESUR Negative 05/27/2013 1839         Time coordinating discharge: Over 45 minutes  SIGNED: Kendell Bane, MD, FACP, FHM. [facility],  Please use [email or web address] to Page If 7PM-7AM, please contact night-coverage [email or web address] 09/13/2019, 9:39 AM

## 2019-09-13 NOTE — Plan of Care (Signed)
Discharge order received. Patient mental status is at baseline. Vital signs stable . No signs of acute distress. Discharge instructions given including renal drain care. Patient verbalized understanding. No other issues noted at this time.

## 2019-09-13 NOTE — Progress Notes (Signed)
Mobility Specialist - Progress Note   09/13/19 1326  Mobility  Activity Refused mobility (Cancellation)  Mobility performed by Mobility specialist     Per nurse, pt does not need to be seen at this time d/t being d/c.     Jermie Hippe Mobility Specialist  09/13/19, 1:26 PM

## 2019-09-13 NOTE — Telephone Encounter (Signed)
Patient called, was discharged from hospital today. Patient has an appointment with you on 09/28/2019, does she still need a RUS?

## 2019-09-14 LAB — AEROBIC/ANAEROBIC CULTURE W GRAM STAIN (SURGICAL/DEEP WOUND)

## 2019-09-14 MED ORDER — CEPHALEXIN 500 MG PO CAPS: 500.0000 mg | ORAL_CAPSULE | Freq: Two times a day (BID) | ORAL | 0 refills | Status: DC

## 2019-09-14 NOTE — Telephone Encounter (Signed)
Notified patient of new ABX sent to pharmacy to be started on 9/13. Patient verbalized understanding.

## 2019-09-14 NOTE — Telephone Encounter (Signed)
Notified patient as advised. Patients ABX will run out 9/12, one week before patient is scheduled at our office.

## 2019-09-18 ENCOUNTER — Other Ambulatory Visit: Payer: Medicaid Other

## 2019-09-20 ENCOUNTER — Telehealth: Payer: Self-pay

## 2019-09-20 NOTE — Telephone Encounter (Signed)
Patient called and left a message requesting pain medication refill. Attempted to return call no answer left patient a message to return.

## 2019-09-20 NOTE — Telephone Encounter (Signed)
Patient called stating that the drainage tube is causing her discomfort espically at night when laying down. Patient wanted to know if she could have pain medication to help with the pain. She states she has only tried tylenol OTC with out relief. Patient was encouraged to try OTC NSAIDS. If her pain still persists she is to call back. Patient denies fever, chills, nausea, or vomiting

## 2019-09-28 ENCOUNTER — Other Ambulatory Visit: Payer: Self-pay

## 2019-09-28 ENCOUNTER — Ambulatory Visit (INDEPENDENT_AMBULATORY_CARE_PROVIDER_SITE_OTHER): Payer: Medicaid Other | Admitting: Urology

## 2019-09-28 ENCOUNTER — Encounter: Payer: Self-pay | Admitting: Urology

## 2019-09-28 VITALS — BP 128/74 | HR 103 | Ht 66.0 in | Wt 206.0 lb

## 2019-09-28 DIAGNOSIS — N151 Renal and perinephric abscess: Secondary | ICD-10-CM | POA: Diagnosis not present

## 2019-09-28 MED ORDER — SODIUM CHLORIDE 0.9% FLUSH
10.0000 mL | Freq: Two times a day (BID) | INTRAVENOUS | 1 refills | Status: DC
Start: 2019-09-28 — End: 2019-10-23

## 2019-09-28 NOTE — Patient Instructions (Signed)
Pyelonephritis, Adult  Pyelonephritis is an infection that occurs in the kidney. The kidneys are organs that help clean the blood by moving waste out of the blood and into the pee (urine). This infection can happen quickly, or it can last for a long time. In most cases, it clears up with treatment and does not cause other problems. What are the causes? This condition may be caused by:  Germs (bacteria) going from the bladder up to the kidney. This may happen after having a bladder infection.  Germs going from the blood to the kidney. What increases the risk? This condition is more likely to develop in:  Pregnant women.  Older people.  People who have any of these conditions: ? Diabetes. ? Inflammation of the prostate gland (prostatitis), in males. ? Kidney stones or bladder stones. ? Other problems with the kidney or the parts of your body that carry pee from the kidneys to the bladder (ureters). ? Cancer.  People who have a small, thin tube (catheter) placed in the bladder.  People who are sexually active.  Women who use a medicine that kills sperm (spermicide) to prevent pregnancy.  People who have had a prior urinary tract infection (UTI). What are the signs or symptoms? Symptoms of this condition include:  Peeing often.  A strong urge to pee right away.  Burning or stinging when peeing.  Belly pain.  Back pain.  Pain in the side (flank area).  Fever or chills.  Blood in the pee, or dark pee.  Feeling sick to your stomach (nauseous) or throwing up (vomiting). How is this treated? This condition may be treated by:  Taking antibiotic medicines by mouth (orally).  Drinking enough fluids. If the infection is bad, you may need to stay in the hospital. You may be given antibiotics and fluids that are put directly into a vein through an IV tube. In some cases, other treatments may be needed. Follow these instructions at home: Medicines  Take your antibiotic  medicine as told by your doctor. Do not stop taking the antibiotic even if you start to feel better.  Take over-the-counter and prescription medicines only as told by your doctor. General instructions   Drink enough fluid to keep your pee pale yellow.  Avoid caffeine, tea, and carbonated drinks.  Pee (urinate) often. Avoid holding in pee for long periods of time.  Pee before and after sex.  After pooping (having a bowel movement), women should wipe from front to back. Use each tissue only once.  Keep all follow-up visits as told by your doctor. This is important. Contact a doctor if:  You do not feel better after 2 days.  Your symptoms get worse.  You have a fever. Get help right away if:  You cannot take your medicine or drink fluids as told.  You have chills and shaking.  You throw up.  You have very bad pain in your side or back.  You feel very weak or you pass out (faint). Summary  Pyelonephritis is an infection that occurs in the kidney.  In most cases, this infection clears up with treatment and does not cause other problems.  Take your antibiotic medicine as told by your doctor. Do not stop taking the antibiotic even if you start to feel better.  Drink enough fluid to keep your pee pale yellow. This information is not intended to replace advice given to you by your health care provider. Make sure you discuss any questions you have with   your health care provider. Document Revised: 11/01/2017 Document Reviewed: 11/01/2017 Elsevier Patient Education  2020 Elsevier Inc.  

## 2019-09-28 NOTE — Progress Notes (Signed)
09/28/2019 10:19 AM   Katelyn Barnes 31-Dec-1980 [id number]  Referring provider: Cathie Hoops, PA [address] [place],  [place] [address]  Chief Complaint  Patient presents with  . 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 At intitial stent a limited left ureteroscopy revealed possible transition point in the left distal ureter but no stone, mass or stricture. There was a piece of tissue or mucous int he bladder. A follow-up ultrasound after the stent revealed some increased gas in the parenchyma, but no obvious hydronephrosis. She ultimately improved and was discharged on Cipro.   To be certain, Dr. Richardo Hanks took for repeat LEFT URS and RGP 07/06/2019 which was normal.  Again there was no stricture, mass or stone in the ureter.  A new stent was left with a string and patient removed the stent.  Follow-up ultrasound was done 08/14/2019 continued to show that the collecting system was very echogenic with shadowing which made detection of hydronephrosis difficult.  There was a left ureteral jet and her post void was 23 mL. She had no dysuria, flank pain or fever. She could not leave a UA.   She developed fever and chills 09/05/2019. CT revealed a new 10 cm left renal abscess and she was admitted to hospital. IR placed a drain and she improved. Urine Cx grew e coli and I have kept her on cephalexin. She is well today. No fever. Drain ouput down to 20-40 cc per day with a 10 cc flush.     PMH: Past Medical History:  Diagnosis Date  . DDD (degenerative disc disease), lumbar   . Diabetes mellitus without complication (HCC)    Type II  . DVT (deep venous thrombosis) (HCC) 2016   post back surgery - was on Eliquis 3 months  . Kidney stone     Surgical  History: Past Surgical History:  Procedure Laterality Date  . ANTERIOR CERVICAL DECOMP/DISCECTOMY FUSION Right 05/05/2016   Procedure: ANTERIOR CERVICAL DECOMPRESSION FUSION, CERVICAL 7- THORACIC 1 WITH INSTRUMENTATION AND ALLOGRAFT;  Surgeon: Estill Bamberg, MD;  Location: [facility] OR;  Service: Orthopedics;  Laterality: Right;  ANTERIOR CERVICAL DECOMPRESSION FUSION, CERVICAL 7- THORACIC 1 WITH INSTRUMENTATION AND ALLOGRAFT  . BACK SURGERY  2016   discectomy   . CARPAL TUNNEL RELEASE Bilateral   . CYSTOSCOPY W/ RETROGRADES Left 07/06/2019   Procedure: CYSTOSCOPY WITH RETROGRADE PYELOGRAM;  Surgeon: [name], MD;  Location: [facility] ORS;  Service: Urology;  Laterality: Left;  . CYSTOSCOPY WITH URETEROSCOPY AND STENT PLACEMENT Left 06/11/2019   Procedure: CYSTOSCOPY WITH URETEROSCOPY AND STENT PLACEMENT;  Surgeon: Jerilee Field, MD;  Location: [facility] ORS;  Service: Urology;  Laterality: Left;  . CYSTOSCOPY WITH URETEROSCOPY AND STENT PLACEMENT Left 07/06/2019   Procedure: CYSTOSCOPY WITH URETEROSCOPY AND STENT exchange;  Surgeon: [name], MD;  Location: [facility] ORS;  Service: Urology;  Laterality: Left;  . SHOULDER ARTHROSCOPY    . TUBAL LIGATION  08/2007    Home Medications:  Allergies as of 09/28/2019      Reactions   Penicillins Rash   14 years ago   Macrobid [nitrofurantoin] Nausea And Vomiting      Medication List       Accurate as of September 28, 2019 10:19 AM. If you have  any questions, ask your nurse or doctor.        acetaminophen 325 MG tablet Commonly known as: TYLENOL Take 2 tablets (650 mg total) by mouth every 6 (six) hours as needed for mild pain (or Fever >/= 101).   atorvastatin 40 MG tablet Commonly known as: LIPITOR Take by mouth.   cephALEXin 500 MG capsule Commonly known as: Keflex Take 1 capsule (500 mg total) by mouth 2 (two) times daily.   ferrous sulfate 325 (65 FE) MG tablet Take by mouth.   HumuLIN 70/30 KwikPen (70-30) 100 UNIT/ML  KwikPen Generic drug: insulin isophane & regular human Inject 40 Units into the skin 2 (two) times daily with a meal.   losartan 50 MG tablet Commonly known as: COZAAR Take 1 tablet (50 mg total) by mouth daily.   metFORMIN 500 MG tablet Commonly known as: GLUCOPHAGE Take 500 mg by mouth 2 (two) times daily.   multivitamin with minerals Tabs tablet Take 1 tablet by mouth daily.   ondansetron 4 MG tablet Commonly known as: ZOFRAN Take 4 mg by mouth daily as needed.   polyethylene glycol 17 g packet Commonly known as: MIRALAX / GLYCOLAX Take 17 g by mouth daily.   promethazine 12.5 MG tablet Commonly known as: PHENERGAN Take 1 tablet (12.5 mg total) by mouth every 6 (six) hours as needed for nausea or vomiting.   sodium chloride flush 0.9 % Soln Commonly known as: NS 10-40 mLs by Intracatheter route every 12 (twelve) hours.   topiramate 25 MG tablet Commonly known as: TOPAMAX 4 Tablet(s) By Mouth Twice Daily       Allergies:  Allergies  Allergen Reactions  . Penicillins Rash    14 years ago   . Macrobid [Nitrofurantoin] Nausea And Vomiting    Family History: No family history on file.  Social History:  reports that she has never smoked. She has never used smokeless tobacco. She reports that she does not drink alcohol and does not use drugs.   Physical Exam: BP 128/74 (BP Location: Left Arm, Patient Position: Sitting, Cuff Size: Normal)   Pulse (!) 103   Ht 5\' 6"  (1.676 m)   Wt 206 lb (93.4 kg)   BMI 33.25 kg/m   Constitutional:  Alert and oriented, No acute distress. HEENT: [place] AT, moist mucus membranes.  Trachea midline, no masses. Cardiovascular: No clubbing, cyanosis, or edema. Respiratory: Normal respiratory effort, no increased work of breathing. GI: Abdomen is soft, nontender, nondistended, no abdominal masses GU: No CVA tenderness; left flank drain in place - scant drainage  Lymph: No cervical or inguinal lymphadenopathy. Skin: No rashes, bruises  or suspicious lesions. Neurologic: Grossly intact, no focal deficits, moving all 4 extremities. Psychiatric: Normal mood and affect.  Laboratory Data: Lab Results  Component Value Date   WBC 9.3 09/12/2019   HGB 9.2 (L) 09/12/2019   HCT 29.7 (L) 09/12/2019   MCV 83.7 09/12/2019   PLT 313 09/12/2019    Lab Results  Component Value Date   CREATININE 0.85 09/12/2019    No results found for: PSA  No results found for: TESTOSTERONE  Lab Results  Component Value Date   HGBA1C 7.5 (H) 09/05/2019    Urinalysis    Component Value Date/Time   COLORURINE YELLOW (A) 06/11/2019 0859   APPEARANCEUR Cloudy (A) 09/05/2019 1304   LABSPEC 1.015 06/11/2019 0859   LABSPEC 1.037 05/27/2013 1839   PHURINE 5.0 06/11/2019 0859   GLUCOSEU Negative 09/05/2019 1304   GLUCOSEU >=500 05/27/2013 1839  HGBUR MODERATE (A) 06/11/2019 0859   BILIRUBINUR Negative 09/05/2019 1304   BILIRUBINUR Negative 05/27/2013 1839   KETONESUR 5 (A) 06/11/2019 0859   PROTEINUR 1+ (A) 09/05/2019 1304   PROTEINUR 100 (A) 06/11/2019 0859   NITRITE Negative 09/05/2019 1304   NITRITE NEGATIVE 06/11/2019 0859   LEUKOCYTESUR 1+ (A) 09/05/2019 1304   LEUKOCYTESUR TRACE (A) 06/11/2019 0859   LEUKOCYTESUR Negative 05/27/2013 1839    Lab Results  Component Value Date   LABMICR See below: 09/05/2019   WBCUA 11-30 (A) 09/05/2019   LABEPIT 0-10 09/05/2019   BACTERIA Many (A) 09/05/2019    Pertinent Imaging: CT  Hospital records, labs, cx, notes  No results found for this or any previous visit.  No results found for this or any previous visit.  No results found for this or any previous visit.  No results found for this or any previous visit.  Results for orders placed during the hospital encounter of 08/14/19  US RENAL  Narrative CLINICAL DATA:  History of hydronephrosis  EXAM: RENAL / URINARY TRACT ULTRASOUND COMPLETE  COMPARISON:  Ultrasound 06/13/2019, CT 06/11/2019  FINDINGS: Right  Kidney:  Renal measurements: 13.2 x 6.8 x 7 cm = volume: 328.7 mL . Echogenicity within normal limits. No mass or hydronephrosis visualized.  Left Kidney:  Renal measurements: 15.5 x 8 x 8.9 cm = volume: 574.4 mL. Diffuse echogenic shadowing at the renal collecting system suggestive of air within the collecting system. This limits evaluation for hydronephrosis. Possible interval complex fluid collection at the lower pole of the left kidney, poorly defined.  Bladder:  Debris within the posterior bladder. Prevoid volume of 198.6 CC, postvoid volume of 23.1 cc.  Other:  None.  IMPRESSION: 1. Diffuse echogenic shadowing at the left renal collecting system suspicious for gas within the collecting system, this limits evaluation for hydronephrosis. Consider correlation with CT. Possible development of small complex fluid collection at the lower pole of left kidney. This could also be evaluated at CT follow-up. 2. Both kidneys are generous in size. There is no right hydronephrosis.  These results will be called to the ordering clinician or representative by the Radiologist Assistant, and communication documented in the PACS or [employer].   Electronically Signed By: Jasmine Pang M.D. On: 08/15/2019 00:09  No results found for this or any previous visit.  No results found for this or any previous visit.  Results for orders placed during the hospital encounter of 09/05/19  CT RENAL STONE STUDY  Narrative CLINICAL DATA:  Left flank pain with nausea and chills for 3 days. History of left urinary tract obstruction with stent approximately 2-3 months ago.  EXAM: CT ABDOMEN AND PELVIS WITHOUT CONTRAST  TECHNIQUE: Multidetector CT imaging of the abdomen and pelvis was performed following the standard protocol without IV contrast.  COMPARISON:  08/14/2019 renal ultrasound.  Most recent CT 06/11/2019  FINDINGS: Lower chest: Clear lung bases. Normal heart size  without pericardial or pleural effusion.  Hepatobiliary: Normal liver. Normal gallbladder, without biliary ductal dilatation.  Pancreas: Normal, without mass or ductal dilatation.  Spleen: Normal in size, without focal abnormality.  Adrenals/Urinary Tract: Normal right adrenal gland. The left adrenal is poorly evaluated. Normal right kidney, without stone, hydronephrosis. No bladder calculi.  Significant progression of left renal infection. Normal renal morphology is essentially replaced (small amount of retained parenchyma is suspected in the lower pole left kidney on 48/2) by a gas and fluid collection, most consistent with abscess. Example 9.8 x 10.5 cm on  35/2. 12.3 cm craniocaudal on 77 coronal. Displacement of the descending colon laterally and adjacent small bowel loops minimally anteriorly.  No hydroureter or ureteric stone.  Stomach/Bowel: Normal stomach, without wall thickening. The descending colon is intimately associated with the left renal process, without well-defined fistulous communication identified. Example 47/2. Normal terminal ileum and appendix. Normal small bowel.  Vascular/Lymphatic: Aortic atherosclerosis. Left periaortic 1.0 cm node on 39/2 is similar, likely reactive. No pelvic sidewall adenopathy.  Reproductive: Normal uterus and adnexa.  Tubal ligation.  Other: No significant free fluid. Mild pelvic floor laxity. No free intraperitoneal air.  Musculoskeletal: Lumbar spondylosis.  IMPRESSION: 1. 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 2. Borderline abdominal adenopathy, likely reactive. 3.  Aortic Atherosclerosis (ICD10-I70.0).  This is age advanced.  These results will be called to the ordering clinician or representative by the Radiologist Assistant, and  communication documented in the PACS or [employer].   Electronically Signed By: Jeronimo Greaves M.D. On: 09/05/2019 14:53   Assessment & Plan:    Complex left renal abscess - I consulted with IR. Rec non-con CT for abscess size/resolution and consider referral for drain removal / repositioning. I will consult with ID after CT results. We discussed the hope is her kidney will heal and the infection resolve but there are cases where patient's need a nephrectomy due to recurrent kidney infection.   No follow-ups on file.  Jerilee Field, MD  [facility] [address] [place], [place] [address] [phone]

## 2019-10-02 ENCOUNTER — Telehealth: Payer: Self-pay | Admitting: *Deleted

## 2019-10-02 NOTE — Telephone Encounter (Signed)
Patient called in this morning and states she went to [facility] ER and had a ct scan done. She also is positive for Covid .

## 2019-10-04 ENCOUNTER — Telehealth: Payer: Self-pay | Admitting: Family Medicine

## 2019-10-04 DIAGNOSIS — N151 Renal and perinephric abscess: Secondary | ICD-10-CM

## 2019-10-04 NOTE — Telephone Encounter (Signed)
Patient called and left message stating she was to contact our office when she is almost out of the ABX. She states she has 2 days left. Please advise

## 2019-10-05 ENCOUNTER — Other Ambulatory Visit: Payer: Self-pay | Admitting: Radiology

## 2019-10-05 ENCOUNTER — Telehealth: Payer: Self-pay | Admitting: Radiology

## 2019-10-05 MED ORDER — CEPHALEXIN 500 MG PO CAPS: 500.0000 mg | ORAL_CAPSULE | Freq: Two times a day (BID) | ORAL | 0 refills | Status: DC

## 2019-10-05 NOTE — Telephone Encounter (Signed)
Requested for patient to get a disk with the CT images done at [facility] on 10/01/2019 for radiologist to see regarding drain for left renal abscess. This will need to be taken to the [facility] Radiology Desk to be scanned into PACS. Call Gabi Mcfate with questions at [phone].

## 2019-10-05 NOTE — Telephone Encounter (Signed)
RX for ABX sent. Patient notified.

## 2019-10-11 NOTE — Telephone Encounter (Signed)
Per Dr Richarda Overlie in Interventional Radiology, patient will need to bring a disk with CT images to be saved to PACS prior to scheduling new drain or drain manipulation. Patient states she will get a disk with images.

## 2019-10-15 ENCOUNTER — Other Ambulatory Visit: Payer: Self-pay | Admitting: Urology

## 2019-10-15 ENCOUNTER — Ambulatory Visit
Admission: RE | Admit: 2019-10-15 | Discharge: 2019-10-15 | Disposition: A | Discharge status: Home / Self Care | Payer: Self-pay | Source: Ambulatory Visit | Attending: Urology | Admitting: Urology

## 2019-10-15 DIAGNOSIS — N2889 Other specified disorders of kidney and ureter: Secondary | ICD-10-CM

## 2019-10-17 ENCOUNTER — Other Ambulatory Visit: Payer: Self-pay

## 2019-10-17 ENCOUNTER — Ambulatory Visit (INDEPENDENT_AMBULATORY_CARE_PROVIDER_SITE_OTHER): Payer: Medicaid Other | Admitting: Urology

## 2019-10-17 ENCOUNTER — Ambulatory Visit
Admission: RE | Admit: 2019-10-17 | Discharge: 2019-10-17 | Disposition: A | Discharge status: Home / Self Care | Payer: Medicaid Other | Source: Ambulatory Visit | Attending: Urology | Admitting: Urology

## 2019-10-17 ENCOUNTER — Encounter (HOSPITAL_COMMUNITY): Payer: Self-pay

## 2019-10-17 ENCOUNTER — Other Ambulatory Visit: Payer: Self-pay | Admitting: Radiology

## 2019-10-17 ENCOUNTER — Telehealth: Payer: Self-pay | Admitting: Urology

## 2019-10-17 ENCOUNTER — Encounter: Payer: Self-pay | Admitting: Urology

## 2019-10-17 ENCOUNTER — Ambulatory Visit: Payer: Medicaid Other

## 2019-10-17 VITALS — BP 104/72 | HR 102 | Ht 66.0 in | Wt 204.0 lb

## 2019-10-17 DIAGNOSIS — N151 Renal and perinephric abscess: Secondary | ICD-10-CM

## 2019-10-17 NOTE — Progress Notes (Signed)
   10/17/2019 3:48 PM   Katelyn Barnes 08-Dec-1980 [id number]  Reason for visit: Follow up renal abscess  HPI: I saw Katelyn Barnes as an add-on in urology clinic today for possible problems with her abscess drain.  Briefly, she is a complex 39 year old female who has been managed primarily by Dr. Mena Goes and has a history of left hydronephrosis and pyelonephritis in May 2021 requiring an urgent left ureteral stent.  I took her to the operating room in June 2021 for a left diagnostic ureteroscopy which was completely normal.  She developed fevers at the end of August 2021 and CT showed a new 10 cm left renal abscess with impressive gas within the kidney and collecting system and she was admitted and IR placed a drain, which remains in place.  She has been on Keflex.  She also had Covid at the end of September.  She denies any fevers or chills over the last few days.  She was added on today for possible pus from the drain site.  On exam, the drain is sutured in place with no evidence of purulence, and thin yellow drainage in the tubing.  There is a small scab at the superior aspect of the drain, but no purulent drainage or evidence of abscess.  She also had a CT today which I personally reviewed that shows persistent gas and abscess in the left kidney, though slightly smaller from prior.  There is no evidence of subcutaneous abscess at the drain site.  I discussed her case at length with both Dr. Loreta Ave of interventional radiology, as well as Dr. Mena Goes.  We all agree that there is some undrained abscess that requires drain upsizing/replacement.  This has been arranged for later this week in [place].  I also discussed with Dr. Mena Goes the possibility of changing her antibiotic to Cipro for better renal parenchymal penetration, and he will discuss with interventional radiology and consider antibiotic change.  -Drain exchange/upsize with interventional radiology scheduled this week -Continue  antibiotics, Dr. Mena Goes will discuss with infectious disease and consider changing to Cipro  [name], MD  [facility] [address], [address] [place], [place] [address] [phone]

## 2019-10-17 NOTE — Telephone Encounter (Signed)
Patient is being treated for renal abscess by Dr.Eskridge.  She called the office today with a complaint of "puss pocket at the drain site".    Dr. Richardo Hanks advised that some drainage is normal and should be cleaned with soap and water.  Patient indicated that she would like to be seen today to have someone take a look at it.  Per Richardo Hanks, okay to add patient to his schedule today.  Patient advised.

## 2019-10-19 ENCOUNTER — Ambulatory Visit (HOSPITAL_COMMUNITY)
Admission: RE | Admit: 2019-10-19 | Discharge: 2019-10-19 | Disposition: A | Discharge status: Home / Self Care | Payer: Medicaid Other | Source: Ambulatory Visit | Attending: Urology | Admitting: Urology

## 2019-10-19 ENCOUNTER — Other Ambulatory Visit: Payer: Self-pay

## 2019-10-19 DIAGNOSIS — Z436 Encounter for attention to other artificial openings of urinary tract: Secondary | ICD-10-CM | POA: Diagnosis present

## 2019-10-19 DIAGNOSIS — N151 Renal and perinephric abscess: Secondary | ICD-10-CM

## 2019-10-19 HISTORY — PX: IR NEPHROSTOMY EXCHANGE LEFT: IMG6069

## 2019-10-19 MED ORDER — LIDOCAINE HCL 1 % IJ SOLN
INTRAMUSCULAR | Status: DC | PRN
  Administered 2019-10-19: 30 mL via INTRADERMAL

## 2019-10-19 MED ORDER — IOHEXOL 300 MG/ML  SOLN
50.0000 mL | Freq: Once | INTRAMUSCULAR | Status: AC | PRN
  Administered 2019-10-19: 15 mL

## 2019-10-19 MED ORDER — LIDOCAINE HCL (PF) 1 % IJ SOLN
INTRAMUSCULAR | Status: AC
  Filled 2019-10-19: qty 30

## 2019-10-19 NOTE — Brief Op Note (Signed)
Interventional Radiology Procedure Note  Procedure: Left renal abscess drain exchange   Complications: None immediate  Estimated Blood Loss: <10 cc  Recommendations:  Drain the bulb suction. Flush three times per day. Record outputs.   Signed,  Katherine Mantle, MD

## 2019-10-22 ENCOUNTER — Telehealth: Payer: Self-pay

## 2019-10-22 DIAGNOSIS — N151 Renal and perinephric abscess: Secondary | ICD-10-CM

## 2019-10-22 NOTE — Telephone Encounter (Signed)
Patient called and left a message stating she has only had 5ml of fluid in her drain and wanted to know when would it be ok to have removed. Please advise?

## 2019-10-23 ENCOUNTER — Other Ambulatory Visit
Admission: RE | Admit: 2019-10-23 | Discharge: 2019-10-23 | Disposition: A | Discharge status: Home / Self Care | Payer: Medicaid Other | Attending: Infectious Diseases | Admitting: Infectious Diseases

## 2019-10-23 ENCOUNTER — Encounter: Payer: Self-pay | Admitting: Infectious Diseases

## 2019-10-23 ENCOUNTER — Other Ambulatory Visit: Payer: Self-pay

## 2019-10-23 ENCOUNTER — Ambulatory Visit: Payer: Medicaid Other | Attending: Infectious Diseases | Admitting: Infectious Diseases

## 2019-10-23 VITALS — BP 110/77 | HR 87 | Temp 98.1°F | Resp 16 | Ht 66.0 in | Wt 204.0 lb

## 2019-10-23 DIAGNOSIS — Z79899 Other long term (current) drug therapy: Secondary | ICD-10-CM | POA: Insufficient documentation

## 2019-10-23 DIAGNOSIS — E1142 Type 2 diabetes mellitus with diabetic polyneuropathy: Secondary | ICD-10-CM | POA: Insufficient documentation

## 2019-10-23 DIAGNOSIS — D649 Anemia, unspecified: Secondary | ICD-10-CM | POA: Insufficient documentation

## 2019-10-23 DIAGNOSIS — Z794 Long term (current) use of insulin: Secondary | ICD-10-CM | POA: Diagnosis not present

## 2019-10-23 DIAGNOSIS — N151 Renal and perinephric abscess: Secondary | ICD-10-CM | POA: Diagnosis present

## 2019-10-23 DIAGNOSIS — Z86718 Personal history of other venous thrombosis and embolism: Secondary | ICD-10-CM | POA: Diagnosis not present

## 2019-10-23 LAB — CBC WITH DIFFERENTIAL/PLATELET
Abs Immature Granulocytes: 0.03 10*3/uL (ref 0.00–0.07)
Basophils Absolute: 0 10*3/uL (ref 0.0–0.1)
Basophils Relative: 0 %
Eosinophils Absolute: 0 10*3/uL (ref 0.0–0.5)
Eosinophils Relative: 0 %
HCT: 31.6 % — ABNORMAL LOW (ref 36.0–46.0)
Hemoglobin: 10.3 g/dL — ABNORMAL LOW (ref 12.0–15.0)
Immature Granulocytes: 0 %
Lymphocytes Relative: 20 %
Lymphs Abs: 1.9 10*3/uL (ref 0.7–4.0)
MCH: 26.9 pg (ref 26.0–34.0)
MCHC: 32.6 g/dL (ref 30.0–36.0)
MCV: 82.5 fL (ref 80.0–100.0)
Monocytes Absolute: 0.6 10*3/uL (ref 0.1–1.0)
Monocytes Relative: 6 %
Neutro Abs: 6.9 10*3/uL (ref 1.7–7.7)
Neutrophils Relative %: 74 %
Platelets: 256 10*3/uL (ref 150–400)
RBC: 3.83 MIL/uL — ABNORMAL LOW (ref 3.87–5.11)
RDW: 16.3 % — ABNORMAL HIGH (ref 11.5–15.5)
WBC: 9.4 10*3/uL (ref 4.0–10.5)
nRBC: 0 % (ref 0.0–0.2)

## 2019-10-23 LAB — COMPREHENSIVE METABOLIC PANEL
ALT: 18 U/L (ref 0–44)
AST: 20 U/L (ref 15–41)
Albumin: 3.8 g/dL (ref 3.5–5.0)
Alkaline Phosphatase: 50 U/L (ref 38–126)
Anion gap: 11 (ref 5–15)
BUN: 22 mg/dL — ABNORMAL HIGH (ref 6–20)
CO2: 19 mmol/L — ABNORMAL LOW (ref 22–32)
Calcium: 9.7 mg/dL (ref 8.9–10.3)
Chloride: 109 mmol/L (ref 98–111)
Creatinine, Ser: 0.79 mg/dL (ref 0.44–1.00)
GFR, Estimated: >60 mL/min (ref >60)
Glucose, Bld: 124 mg/dL — ABNORMAL HIGH (ref 70–99)
Potassium: 4 mmol/L (ref 3.5–5.1)
Sodium: 139 mmol/L (ref 135–145)
Total Bilirubin: 1.1 mg/dL (ref 0.3–1.2)
Total Protein: 8.1 g/dL (ref 6.5–8.1)

## 2019-10-23 LAB — C-REACTIVE PROTEIN: CRP: 1 mg/dL — ABNORMAL HIGH (ref <1.0)

## 2019-10-23 MED ORDER — CIPROFLOXACIN HCL 500 MG PO TABS: 500.0000 mg | ORAL_TABLET | Freq: Two times a day (BID) | ORAL | 0 refills | Status: DC

## 2019-10-23 MED ORDER — PROMETHAZINE HCL 12.5 MG PO TABS: 12.5000 mg | ORAL_TABLET | Freq: Two times a day (BID) | ORAL | 0 refills | Status: DC | PRN

## 2019-10-23 MED ORDER — SODIUM CHLORIDE 0.9% FLUSH: 10.0000 mL | Freq: Two times a day (BID) | INTRAVENOUS | 1 refills | Status: DC

## 2019-10-23 NOTE — Progress Notes (Signed)
NAME: Katelyn Barnes  DOB: 04-06-1980  MRN: [id number]  Date/Time: 10/23/2019 9:06 AM  REQUESTING PROVIDER: Gearldine Shown Subjective:  REASON FOR CONSULT: renal/perirenal abscess ? Katelyn Barnes is a 39 y.o. female with a history of IDDM, DVT is referred to me for ongoing infection of the left kidney. Patient was initially admitted on 06/11/19 to [facility] with left lower quadrant pain. 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  She was also in DKA.  Urine and blood culture grew E. coli which was pretty sensitive.  She was given IV cefepime and while in the hospital.  Urology saw her and left ureteric stent placement on 5/31-she was discharged on 06/16/2019 with 2 weeks of cipro and to follow-up with urology as outpatient.  On 07/06/2019 she had a cystoscopy with left retrograde pyelogram as outpatient and there were no abnormal findings.  A new stent was placed.  And she was asked to remove the stent at home in a few days time which she did. Ultrasound was done on 08/14/2019 and that showed collecting system echogenicity with shadowing which made deduction of hydronephrosis difficult..  She saw Dr. Mena Goes on 08/15/2019 and a repeat urine was sent.  It was reported as mixed urogenital flora. . She was seen by urologist in their office on 09/05/2019 and she was complaining of fever and chills and left renal pain.  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  She was sent to the hospital for admission and IV antibiotics.  She underwent CT-guided drain placement on 09/06/2019.  300 cc of purulent fluid was drained.  The abscess culture was E. coli with good susceptibility.  She was on IV ceftriaxone in  the hospital and was discharged on 10 days of ciprofloxacin on 09/13/2019 with follow-up as outpatient with urologist.  ID was not consulted during either hospitalization. She saw Dr. Mena Goes as outpatient on 09/28/2019 and a repeat CAT scan was done.  A CT done on 10/17/2019 revealed persistent gas and abscess in the left kidney.  Patient was at this time on Keflex 500 mg p.o. twice daily.  The drain was upsized on 10/19/2019 and I am seeing the patient for further management. She does not have any fever She does have some pain in the left flank area. She is anemic She takes care of her mentally challenged son She says her blood sugars runs in the 150s.  Past Medical History:  Diagnosis Date  . DDD (degenerative disc disease), lumbar   . Diabetes mellitus without complication (HCC)    Type II  . DVT (deep venous thrombosis) (HCC) 2016   post back surgery - was on Eliquis 3 months  . Kidney stone     Past Surgical History:  Procedure Laterality Date  . ANTERIOR CERVICAL DECOMP/DISCECTOMY FUSION Right 05/05/2016   Procedure: ANTERIOR CERVICAL DECOMPRESSION FUSION, CERVICAL 7- THORACIC 1 WITH INSTRUMENTATION AND ALLOGRAFT;  Surgeon: Estill Bamberg, MD;  Location: [facility] OR;  Service: Orthopedics;  Laterality: Right;  ANTERIOR CERVICAL DECOMPRESSION FUSION, CERVICAL 7- THORACIC 1 WITH INSTRUMENTATION AND ALLOGRAFT  . BACK SURGERY  2016   discectomy   . CARPAL TUNNEL RELEASE Bilateral   . CYSTOSCOPY W/ RETROGRADES Left 07/06/2019   Procedure: CYSTOSCOPY WITH RETROGRADE PYELOGRAM;  Surgeon: Legrand Rams  C, MD;  Location: [facility] ORS;  Service: Urology;  Laterality: Left;  . CYSTOSCOPY WITH URETEROSCOPY AND STENT PLACEMENT Left 06/11/2019   Procedure: CYSTOSCOPY WITH URETEROSCOPY AND STENT PLACEMENT;  Surgeon: Festus Aloe, MD;  Location: [facility] ORS;  Service: Urology;  Laterality: Left;  . CYSTOSCOPY WITH URETEROSCOPY AND STENT PLACEMENT Left 07/06/2019   Procedure: CYSTOSCOPY WITH URETEROSCOPY AND  STENT exchange;  Surgeon: Billey Co, MD;  Location: [facility] ORS;  Service: Urology;  Laterality: Left;  . IR NEPHROSTOMY EXCHANGE LEFT  10/19/2019  . SHOULDER ARTHROSCOPY    . TUBAL LIGATION  08/2007    Social History   Socioeconomic History  . Marital status: Legally Separated    Spouse name: Not on file  . Number of children: Not on file  . Years of education: Not on file  . Highest education level: Not on file  Occupational History  . Not on file  Tobacco Use  . Smoking status: Never Smoker  . Smokeless tobacco: Never Used  Vaping Use  . Vaping Use: Never used  Substance and Sexual Activity  . Alcohol use: No  . Drug use: No  . Sexual activity: Yes  Other Topics Concern  . Not on file  Social History Narrative  . Not on file   Social Determinants of Health   Financial Resource Strain:   . Difficulty of Paying Living Expenses: Not on file  Food Insecurity:   . Worried About Charity fundraiser in the Last Year: Not on file  . Ran Out of Food in the Last Year: Not on file  Transportation Needs:   . Lack of Transportation (Medical): Not on file  . Lack of Transportation (Non-Medical): Not on file  Physical Activity:   . Days of Exercise per Week: Not on file  . Minutes of Exercise per Session: Not on file  Stress:   . Feeling of Stress : Not on file  Social Connections:   . Frequency of Communication with Friends and Family: Not on file  . Frequency of Social Gatherings with Friends and Family: Not on file  . Attends Religious Services: Not on file  . Active Member of Clubs or Organizations: Not on file  . Attends Archivist Meetings: Not on file  . Marital Status: Not on file  Intimate Partner Violence:   . Fear of Current or Ex-Partner: Not on file  . Emotionally Abused: Not on file  . Physically Abused: Not on file  . Sexually Abused: Not on file    No family history on file. Allergies  Allergen Reactions  . Penicillins Rash    14 years  ago   . Macrobid [Nitrofurantoin] Nausea And Vomiting    ? Current Outpatient Medications  Medication Sig Dispense Refill  . atorvastatin (LIPITOR) 40 MG tablet Take by mouth.    . cephALEXin (KEFLEX) 500 MG capsule Take 1 capsule (500 mg total) by mouth 2 (two) times daily. 60 capsule 0  . ferrous sulfate 325 (65 FE) MG tablet Take by mouth.    . insulin isophane & regular human (HUMULIN 70/30 KWIKPEN) (70-30) 100 UNIT/ML KwikPen Inject 40 Units into the skin 2 (two) times daily with a meal.     . losartan (COZAAR) 50 MG tablet Take 1 tablet (50 mg total) by mouth daily. 30 tablet 0  . metFORMIN (GLUCOPHAGE) 500 MG tablet Take 500 mg by mouth 2 (two) times daily.    . ondansetron (ZOFRAN) 4 MG tablet Take 4 mg  by mouth daily as needed.    . polyethylene glycol (MIRALAX / GLYCOLAX) 17 g packet Take 17 g by mouth daily. 14 each 0  . promethazine (PHENERGAN) 12.5 MG tablet Take 1 tablet (12.5 mg total) by mouth every 6 (six) hours as needed for nausea or vomiting. 30 tablet 0  . sodium chloride flush (NS) 0.9 % SOLN 10-40 mLs by Intracatheter route every 12 (twelve) hours. 125 mL 1  . topiramate (TOPAMAX) 25 MG tablet 4 Tablet(s) By Mouth Twice Daily     No current facility-administered medications for this visit.     Abtx:  Anti-infectives (From admission, onward)   None      REVIEW OF SYSTEMS:  Const: negative fever, negative chills, negative weight loss Eyes: negative diplopia or visual changes, negative eye pain ENT: negative coryza, negative sore throat Resp: negative cough, hemoptysis, dyspnea Cards: negative for chest pain, palpitations, lower extremity edema GU: negative for frequency, dysuria and hematuria, left flank pain GI: Negative for abdominal pain, diarrhea, bleeding, constipation Skin: negative for rash and pruritus Heme: negative for easy bruising and gum/nose bleeding MS: Generalized weakness Neurolo: Has dizziness.  Endocrine: Diabetes  mellitus Allergy/Immunology- : Penicillin causes rash Macrobid nausea vomiting. Objective:  VITALS:  BP 110/77   Pulse 87   Temp 98.1 F (36.7 C) (Oral)   Resp 16   Ht $R'5\' 6"'GN$  (1.676 m)   Wt 204 lb (92.5 kg)   SpO2 95%   BMI 32.93 kg/m PHYSICAL EXAM:  General: Alert, cooperative, no distress, appears stated age.  Pale Head: Normocephalic, without obvious abnormality, atraumatic. Eyes: Conjunctivae clear, anicteric sclerae. Pupils are equal ENT did not examine  neck: Supple, . Back: Left flank drain covered with dressing Minimal purulent fluid in the tube Lungs: Bilateral air entry. Decreased in the bases  Heart: S1-S2 Abdomen: Soft, non-tender,not distended. Bowel sounds normal. No masses Extremities: atraumatic, no cyanosis. No edema. No clubbing Skin: No rashes or lesions. Or bruising Lymph: Cervical, supraclavicular normal. Neurologic: Grossly non-focal Pertinent Labs Lab Results CBC    Component Value Date/Time   WBC 9.3 09/12/2019 0509   RBC 3.55 (L) 09/12/2019 0509   HGB 9.2 (L) 09/12/2019 0509   HGB 14.0 05/27/2013 1839   HCT 29.7 (L) 09/12/2019 0509   HCT 42.2 05/27/2013 1839   PLT 313 09/12/2019 0509   PLT 168 05/27/2013 1839   MCV 83.7 09/12/2019 0509   MCV 87 05/27/2013 1839   MCH 25.9 (L) 09/12/2019 0509   MCHC 31.0 09/12/2019 0509   RDW 16.6 (H) 09/12/2019 0509   RDW 13.8 05/27/2013 1839   LYMPHSABS 2.1 09/12/2019 0509   LYMPHSABS 2.8 05/27/2013 1839   MONOABS 0.5 09/12/2019 0509   MONOABS 0.3 05/27/2013 1839   EOSABS 0.1 09/12/2019 0509   EOSABS 0.2 05/27/2013 1839   BASOSABS 0.0 09/12/2019 0509   BASOSABS 0.1 05/27/2013 1839    CMP Latest Ref Rng & Units 09/12/2019 09/09/2019 09/07/2019  Glucose 70 - 99 mg/dL 225(H) 204(H) 175(H)  BUN 6 - 20 mg/dL 28(H) 16 13  Creatinine 0.44 - 1.00 mg/dL 0.85 0.71 0.75  Sodium 135 - 145 mmol/L 134(L) 135 134(L)  Potassium 3.5 - 5.1 mmol/L 4.3 3.9 3.5  Chloride 98 - 111 mmol/L 102 104 105  CO2 22 - 32 mmol/L  24 21(L) 21(L)  Calcium 8.9 - 10.3 mg/dL 9.3 8.6(L) 8.6(L)  Total Protein 6.5 - 8.1 g/dL - - 7.5  Total Bilirubin 0.3 - 1.2 mg/dL - - 1.0  Alkaline Phos  38 - 126 U/L - - 57  AST 15 - 41 U/L - - 12(L)  ALT 0 - 44 U/L - - 11      Microbiology: No results found for this or any previous visit (from the past 240 hour(s)).  06/11/19   09/05/19    10/17/19  IMAGING RESULTS: I have personally reviewed the films ? 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  She has been on antibiotics since August 2021 initially was on IV and then ciprofloxacin and now on suppressive Keflex 500 mg twice daily. The last urine culture has been from August and and it showed susceptible E. coli. She has had stents initially in the left ureter in August and now recently she has had drains placed in the left kidney area.  On 10/19/2019 the drain was upsized. Today I will send a culture from the fluid. I will also do CBC, CMP, ESR and CRP. Will discontinue Keflex and change to ciprofloxacin 500 mg p.o. twice daily. Discussed the side effects of ciprofloxacin including tendon and ligament weakening and rupture Also asked her to monitor her blood sugar as there is a possibility of hypoglycemia while on Metformin with ciprofloxacin. Discussed with Dr. Junious Silk that she needs a repeat CAT scan in 2 weeks time and if there is no significant improvement she will need surgical intervention.  Diabetes mellitus on insulin and Metformin. Peripheral neuropathy on topiramate Anemia on iron supplements Patient asked for promethazine for nausea with antibiotics.  Gave her as needed twice daily for 30 pills. ? ?Follow-up with me in 3 weeks. ___________________________________________________ Discussed with patient, requesting provider Note:  This document was prepared using Dragon voice recognition  software and may include unintentional dictation errors.

## 2019-10-23 NOTE — Telephone Encounter (Signed)
Rx sent for saline flush to [facility] in [place].

## 2019-10-23 NOTE — Patient Instructions (Signed)
You are here for perirenal/renal abscess left kidney- you have had the first drain placed in Aug and it was upsized on 10/19/19 The last culture from aug was e.coli- will repeat culture from the abscess fluid toay. Will also do blood work cbc, cmp,

## 2019-10-23 NOTE — Telephone Encounter (Signed)
Patient notified, patient verbalized understanding. Office visit scheduled for 10/29 following CT.

## 2019-10-24 MED ORDER — SODIUM CHLORIDE 0.9% FLUSH: 10.0000 mL | Freq: Two times a day (BID) | INTRAVENOUS | 0 refills | Status: AC

## 2019-10-24 NOTE — Addendum Note (Signed)
Addended by: Lizbeth Bark on: 10/24/2019 09:44 AM   Modules accepted: Orders

## 2019-10-25 LAB — BODY FLUID CULTURE

## 2019-11-06 ENCOUNTER — Other Ambulatory Visit: Payer: Self-pay

## 2019-11-06 ENCOUNTER — Ambulatory Visit
Admission: RE | Admit: 2019-11-06 | Discharge: 2019-11-06 | Disposition: A | Discharge status: Home / Self Care | Payer: Medicaid Other | Source: Ambulatory Visit | Attending: Urology | Admitting: Urology

## 2019-11-06 DIAGNOSIS — N151 Renal and perinephric abscess: Secondary | ICD-10-CM | POA: Diagnosis present

## 2019-11-06 MED ORDER — IOHEXOL 300 MG/ML  SOLN
100.0000 mL | Freq: Once | INTRAMUSCULAR | Status: AC | PRN
  Administered 2019-11-06: 100 mL via INTRAVENOUS

## 2019-11-09 ENCOUNTER — Other Ambulatory Visit: Payer: Self-pay

## 2019-11-09 ENCOUNTER — Ambulatory Visit (INDEPENDENT_AMBULATORY_CARE_PROVIDER_SITE_OTHER): Payer: Medicaid Other | Admitting: Urology

## 2019-11-09 ENCOUNTER — Encounter: Payer: Self-pay | Admitting: Urology

## 2019-11-09 VITALS — BP 107/75 | HR 78 | Ht 66.0 in | Wt 211.0 lb

## 2019-11-09 DIAGNOSIS — N151 Renal and perinephric abscess: Secondary | ICD-10-CM

## 2019-11-09 NOTE — Progress Notes (Signed)
11/09/2019 9:20 AM   Katelyn Barnes 10/13/1980 [id number]  Referring provider: Cathie Hoops, PA [address] [place],  [place] [address]  No chief complaint on file.   HPI:  F/u -   Left hydronephrosis/pyelo/renal abscess - she was see 06/11/2019 and underwent an urgent left stent for hydroureteronephrosis down to the left distal ureter in the setting of sepsis and emphysematous pyelitison CT scan.Atintitial stent a limitedleft ureteroscopyrevealed possible transition point in the left distal ureter butno stone, mass or stricture. There was a piece of tissue or mucous in the bladder.A follow-up ultrasound after the stent revealed some increased gas in the parenchyma, but no obvious hydronephrosis. She ultimately improved and was discharged on Cipro.   To be certain, Dr. Richardo Hanks took for repeat LEFT URS and RGP 6/25/2021which was normal. Again there was no stricture, mass or stone in the ureter. A new stent was left with a string and patient removedthe stent. Follow-up ultrasound was done 08/14/2019 continued to show that the collecting system was very echogenic with shadowing which made detection of hydronephrosis difficult. There was a left ureteral jet and her post void was 23 mL. She had no dysuria, flank pain or fever. She could not leave a UA.   She developed fever and chills 09/05/2019. CT revealed a new 10 cm left renal abscess and she was admitted to hospital. IR placed a drain and she improved. Urine Cx grew e coli and she remained on cephalexin. Drain output low.   Since I saw her last she had lower extremity swelling and went to [facility] ED.  CT scan revealed continued abscess with air-fluid level adjacent to the drain on 10/01/2019 . I arranged to have these images imported into our PACS and I went over the images with IR.  I arranged to have her seen by IR and a new drain was placed.  I arranged to have her seen by ID and I have communicated  with Dr. Rivka Safer several times.  Dr. Rivka Safer placed her on Cipro.  Drain culture grew Pseudomonas which might be a contaminant.    Patient has remained stable.No fever. Urine clear. No dysuria. Follow-up CT scan was done 11/06/2019 which showed resolution of the abscess.  I reviewed the images with IR and we agree there was no significant abscess or air-fluid level. We thought the findings on the CT scan might be the drain in the renal parenchyma. Her drain output is only 10 cc per day. I communicated again with Dr. Avon Gully and we will plan to keep Lawanda on Cipro for another 10 to 14 days.  She has follow-up with ID arranged.  PMH: Past Medical History:  Diagnosis Date  . DDD (degenerative disc disease), lumbar   . Diabetes mellitus without complication (HCC)    Type II  . DVT (deep venous thrombosis) (HCC) 2016   post back surgery - was on Eliquis 3 months  . Kidney stone     Surgical History: Past Surgical History:  Procedure Laterality Date  . ANTERIOR CERVICAL DECOMP/DISCECTOMY FUSION Right 05/05/2016   Procedure: ANTERIOR CERVICAL DECOMPRESSION FUSION, CERVICAL 7- THORACIC 1 WITH INSTRUMENTATION AND ALLOGRAFT;  Surgeon: Estill Bamberg, MD;  Location: [facility] OR;  Service: Orthopedics;  Laterality: Right;  ANTERIOR CERVICAL DECOMPRESSION FUSION, CERVICAL 7- THORACIC 1 WITH INSTRUMENTATION AND ALLOGRAFT  . BACK SURGERY  2016   discectomy   . CARPAL TUNNEL RELEASE Bilateral   . CYSTOSCOPY W/ RETROGRADES Left 07/06/2019   Procedure: CYSTOSCOPY WITH  RETROGRADE PYELOGRAM;  Surgeon: Sondra ComeSninsky, Brian C, MD;  Location: [facility] ORS;  Service: Urology;  Laterality: Left;  . CYSTOSCOPY WITH URETEROSCOPY AND STENT PLACEMENT Left 06/11/2019   Procedure: CYSTOSCOPY WITH URETEROSCOPY AND STENT PLACEMENT;  Surgeon: Jerilee FieldEskridge, Mckena Chern, MD;  Location: [facility] ORS;  Service: Urology;  Laterality: Left;  . CYSTOSCOPY WITH URETEROSCOPY AND STENT PLACEMENT Left 07/06/2019   Procedure: CYSTOSCOPY WITH  URETEROSCOPY AND STENT exchange;  Surgeon: Sondra ComeSninsky, Brian C, MD;  Location: [facility] ORS;  Service: Urology;  Laterality: Left;  . IR NEPHROSTOMY EXCHANGE LEFT  10/19/2019  . SHOULDER ARTHROSCOPY    . TUBAL LIGATION  08/2007    Home Medications:  Allergies as of 11/09/2019      Reactions   Penicillins Rash   14 years ago   Macrobid [nitrofurantoin] Nausea And Vomiting      Medication List       Accurate as of November 09, 2019  9:20 AM. If you have any questions, ask your nurse or doctor.        atorvastatin 40 MG tablet Commonly known as: LIPITOR Take by mouth.   cephALEXin 500 MG capsule Commonly known as: Keflex Take 1 capsule (500 mg total) by mouth 2 (two) times daily.   ciprofloxacin 500 MG tablet Commonly known as: Cipro Take 1 tablet (500 mg total) by mouth 2 (two) times daily.   ferrous sulfate 325 (65 FE) MG tablet Take by mouth.   HumuLIN 70/30 KwikPen (70-30) 100 UNIT/ML KwikPen Generic drug: insulin isophane & regular human Inject 40 Units into the skin 2 (two) times daily with a meal.   losartan 50 MG tablet Commonly known as: COZAAR Take 1 tablet (50 mg total) by mouth daily.   metFORMIN 500 MG tablet Commonly known as: GLUCOPHAGE Take 500 mg by mouth 2 (two) times daily.   polyethylene glycol 17 g packet Commonly known as: MIRALAX / GLYCOLAX Take 17 g by mouth daily.   promethazine 12.5 MG tablet Commonly known as: PHENERGAN Take 1 tablet (12.5 mg total) by mouth every 12 (twelve) hours as needed for nausea or vomiting.   sodium chloride flush 0.9 % Soln Commonly known as: NS 10-40 mLs by Intracatheter route every 12 (twelve) hours.   topiramate 25 MG tablet Commonly known as: TOPAMAX 4 Tablet(s) By Mouth Twice Daily       Allergies:  Allergies  Allergen Reactions  . Penicillins Rash    14 years ago   . Macrobid [Nitrofurantoin] Nausea And Vomiting    Family History: No family history on file.  Social History:  reports that she  has never smoked. She has never used smokeless tobacco. She reports that she does not drink alcohol and does not use drugs.   Physical Exam: There were no vitals taken for this visit.  Constitutional:  Alert and oriented, No acute distress. HEENT: [place] AT, moist mucus membranes.  Trachea midline, no masses. Cardiovascular: No clubbing, cyanosis, or edema. Respiratory: Normal respiratory effort, no increased work of breathing. GI: Abdomen is soft, nontender, nondistended, no abdominal masses GU: No CVA tenderness; after discussion and review of CT with IR the left flank drain was removed intake. It was clean and there was no drainage from the site. Site looks healthy.  Skin: No rashes, bruises or suspicious lesions. Neurologic: Grossly intact, no focal deficits, moving all 4 extremities. Psychiatric: Normal mood and affect.  Laboratory Data: Lab Results  Component Value Date   WBC 9.4 10/23/2019   HGB 10.3 (L) 10/23/2019  HCT 31.6 (L) 10/23/2019   MCV 82.5 10/23/2019   PLT 256 10/23/2019    Lab Results  Component Value Date   CREATININE 0.79 10/23/2019    No results found for: PSA  No results found for: TESTOSTERONE  Lab Results  Component Value Date   HGBA1C 7.5 (H) 09/05/2019    Urinalysis    Component Value Date/Time   COLORURINE YELLOW (A) 06/11/2019 0859   APPEARANCEUR Cloudy (A) 09/05/2019 1304   LABSPEC 1.015 06/11/2019 0859   LABSPEC 1.037 05/27/2013 1839   PHURINE 5.0 06/11/2019 0859   GLUCOSEU Negative 09/05/2019 1304   GLUCOSEU >=500 05/27/2013 1839   HGBUR MODERATE (A) 06/11/2019 0859   BILIRUBINUR Negative 09/05/2019 1304   BILIRUBINUR Negative 05/27/2013 1839   KETONESUR 5 (A) 06/11/2019 0859   PROTEINUR 1+ (A) 09/05/2019 1304   PROTEINUR 100 (A) 06/11/2019 0859   NITRITE Negative 09/05/2019 1304   NITRITE NEGATIVE 06/11/2019 0859   LEUKOCYTESUR 1+ (A) 09/05/2019 1304   LEUKOCYTESUR TRACE (A) 06/11/2019 0859   LEUKOCYTESUR Negative 05/27/2013  1839    Lab Results  Component Value Date   LABMICR See below: 09/05/2019   WBCUA 11-30 (A) 09/05/2019   LABEPIT 0-10 09/05/2019   BACTERIA Many (A) 09/05/2019    Pertinent Imaging: CT 08/2019, 10/2019 x 2 No results found for this or any previous visit.  No results found for this or any previous visit.  No results found for this or any previous visit.  No results found for this or any previous visit.  Results for orders placed during the hospital encounter of 08/14/19  US RENAL  Narrative CLINICAL DATA:  History of hydronephrosis  EXAM: RENAL / URINARY TRACT ULTRASOUND COMPLETE  COMPARISON:  Ultrasound 06/13/2019, CT 06/11/2019  FINDINGS: Right Kidney:  Renal measurements: 13.2 x 6.8 x 7 cm = volume: 328.7 mL . Echogenicity within normal limits. No mass or hydronephrosis visualized.  Left Kidney:  Renal measurements: 15.5 x 8 x 8.9 cm = volume: 574.4 mL. Diffuse echogenic shadowing at the renal collecting system suggestive of air within the collecting system. This limits evaluation for hydronephrosis. Possible interval complex fluid collection at the lower pole of the left kidney, poorly defined.  Bladder:  Debris within the posterior bladder. Prevoid volume of 198.6 CC, postvoid volume of 23.1 cc.  Other:  None.  IMPRESSION: 1. Diffuse echogenic shadowing at the left renal collecting system suspicious for gas within the collecting system, this limits evaluation for hydronephrosis. Consider correlation with CT. Possible development of small complex fluid collection at the lower pole of left kidney. This could also be evaluated at CT follow-up. 2. Both kidneys are generous in size. There is no right hydronephrosis.  These results will be called to the ordering clinician or representative by the Radiologist Assistant, and communication documented in the PACS or [employer].   Electronically Signed By: Jasmine Pang M.D. On: 08/15/2019  00:09  No results found for this or any previous visit.  No results found for this or any previous visit.  Results for orders placed during the hospital encounter of 09/05/19  CT RENAL STONE STUDY  Narrative CLINICAL DATA:  Left flank pain with nausea and chills for 3 days. History of left urinary tract obstruction with stent approximately 2-3 months ago.  EXAM: CT ABDOMEN AND PELVIS WITHOUT CONTRAST  TECHNIQUE: Multidetector CT imaging of the abdomen and pelvis was performed following the standard protocol without IV contrast.  COMPARISON:  08/14/2019 renal ultrasound.  Most recent CT  06/11/2019  FINDINGS: Lower chest: Clear lung bases. Normal heart size without pericardial or pleural effusion.  Hepatobiliary: Normal liver. Normal gallbladder, without biliary ductal dilatation.  Pancreas: Normal, without mass or ductal dilatation.  Spleen: Normal in size, without focal abnormality.  Adrenals/Urinary Tract: Normal right adrenal gland. The left adrenal is poorly evaluated. Normal right kidney, without stone, hydronephrosis. No bladder calculi.  Significant progression of left renal infection. Normal renal morphology is essentially replaced (small amount of retained parenchyma is suspected in the lower pole left kidney on 48/2) by a gas and fluid collection, most consistent with abscess. Example 9.8 x 10.5 cm on 35/2. 12.3 cm craniocaudal on 77 coronal. Displacement of the descending colon laterally and adjacent small bowel loops minimally anteriorly.  No hydroureter or ureteric stone.  Stomach/Bowel: Normal stomach, without wall thickening. The descending colon is intimately associated with the left renal process, without well-defined fistulous communication identified. Example 47/2. Normal terminal ileum and appendix. Normal small bowel.  Vascular/Lymphatic: Aortic atherosclerosis. Left periaortic 1.0 cm node on 39/2 is similar, likely reactive. No pelvic  sidewall adenopathy.  Reproductive: Normal uterus and adnexa.  Tubal ligation.  Other: No significant free fluid. Mild pelvic floor laxity. No free intraperitoneal air.  Musculoskeletal: Lumbar spondylosis.  IMPRESSION: 1. 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 2. Borderline abdominal adenopathy, likely reactive. 3.  Aortic Atherosclerosis (ICD10-I70.0).  This is age advanced.  These results will be called to the ordering clinician or representative by the Radiologist Assistant, and communication documented in the PACS or [employer].   Electronically Signed By: Jeronimo Greaves M.D. On: 09/05/2019 14:53   Assessment & Plan:    Left renal abscess - drain removed. Cont cipro. F/u with ID as planned. Check renal US in 2-3 weeks and f/u to review.   No follow-ups on file.  Jerilee Field, MD  [facility] [address], [address] [place], [place] [address] [phone]

## 2019-11-09 NOTE — Patient Instructions (Signed)
Pyelonephritis, Adult °Pyelonephritis is an infection that occurs in the kidney. The kidneys are the organs that filter a person's blood and move waste out of the bloodstream and into the urine. Urine passes from the kidneys, through tubes called ureters, and into the bladder. There are two main types of pyelonephritis: °· Infections that come on quickly without any warning (acute pyelonephritis). °· Infections that last for a long period of time (chronic pyelonephritis). °In most cases, the infection clears up with treatment and does not cause further problems. More severe infections or chronic infections can sometimes spread to the bloodstream or lead to other problems with the kidneys. °What are the causes? °This condition is usually caused by: °· Bacteria traveling from the bladder up to the kidney. This may occur after having a bladder infection (cystitis) or urinary tract infection (UTI). °· Bladder infections caused from bacteria traveling from the bloodstream to the kidney. °What increases the risk? °This condition is more likely to develop in: °· Pregnant women. °· Older people. °· People who have any of these conditions: °? Diabetes. °? Inflammation of the prostate gland (prostatitis), in males. °? Kidney stones or bladder stones. °? Other abnormalities of the kidney or ureter. °? Cancer. °· People who have a catheter placed in the bladder. °· People who are sexually active. °· Women who use spermicides. °· People who have had a prior UTI. °What are the signs or symptoms? °Symptoms of this condition include: °· Frequent urination. °· Strong or persistent urge to urinate. °· Burning or stinging when urinating. °· Abdominal pain. °· Back pain. °· Pain in the side or flank area. °· Fever or chills. °· Blood in the urine, or dark urine. °· Nausea or vomiting. °How is this diagnosed? °This condition may be diagnosed based on: °· Your medical history and a physical exam. °· Urine tests. °· Blood tests. °You may  also have imaging tests of the kidneys, such as an ultrasound or CT scan. °How is this treated? °Treatment for this condition may depend on the severity of the infection. °· If the infection is mild and is found early, you may be treated with antibiotic medicines taken by mouth (orally). You will need to drink fluids to remain hydrated. °· If the infection is more severe, you may need to stay in the hospital and receive antibiotics given directly into a vein through an IV. You may also need to receive fluids through an IV if you are not able to remain hydrated. After your hospital stay, you may need to take oral antibiotics for a period of time. °Other treatments may be required, depending on the cause of the infection. °Follow these instructions at home: °Medicines °· Take your antibiotic medicine as told by your health care provider. Do not stop taking the antibiotic even if you start to feel better. °· Take over-the-counter and prescription medicines only as told by your health care provider. °General instructions ° °· Drink enough fluid to keep your urine pale yellow. °· Avoid caffeine, tea, and carbonated beverages. They tend to irritate the bladder. °· Urinate often. Avoid holding in urine for long periods of time. °· Urinate before and after sex. °· After a bowel movement, women should cleanse from front to back. Use each tissue only once. °· Keep all follow-up visits as told by your health care provider. This is important. °Contact a health care provider if: °· Your symptoms do not get better after 2 days of treatment. °· Your symptoms get worse. °·   You have a fever. °Get help right away if you: °· Are unable to take your antibiotics or fluids. °· Have shaking chills. °· Vomit. °· Have severe flank or back pain. °· Have extreme weakness or fainting. °Summary °· Pyelonephritis is a urinary tract infection (UTI) that occurs in the kidney. °· Treatment for this condition may depend on the severity of the  infection. °· Take your antibiotic medicine as told by your health care provider. Do not stop taking the antibiotic even if you start to feel better. °· Drink enough fluid to keep your urine pale yellow. °· Keep all follow-up visits as told by your health care provider. This is important. °This information is not intended to replace advice given to you by your health care provider. Make sure you discuss any questions you have with your health care provider. °Document Revised: 11/01/2017 Document Reviewed: 11/01/2017 °Elsevier Patient Education © 2020 [employer] ° °

## 2019-11-15 ENCOUNTER — Ambulatory Visit: Payer: Medicaid Other | Attending: Infectious Diseases | Admitting: Infectious Diseases

## 2019-11-15 ENCOUNTER — Other Ambulatory Visit
Admission: RE | Admit: 2019-11-15 | Discharge: 2019-11-15 | Disposition: A | Discharge status: Home / Self Care | Payer: Medicaid Other | Attending: Infectious Diseases | Admitting: Infectious Diseases

## 2019-11-15 ENCOUNTER — Encounter: Payer: Self-pay | Admitting: Infectious Diseases

## 2019-11-15 VITALS — BP 129/79 | HR 90 | Temp 98.2°F | Resp 16 | Ht 66.0 in | Wt 215.0 lb

## 2019-11-15 DIAGNOSIS — Z87442 Personal history of urinary calculi: Secondary | ICD-10-CM | POA: Diagnosis not present

## 2019-11-15 DIAGNOSIS — N151 Renal and perinephric abscess: Secondary | ICD-10-CM | POA: Insufficient documentation

## 2019-11-15 DIAGNOSIS — Z794 Long term (current) use of insulin: Secondary | ICD-10-CM | POA: Diagnosis not present

## 2019-11-15 DIAGNOSIS — E1142 Type 2 diabetes mellitus with diabetic polyneuropathy: Secondary | ICD-10-CM | POA: Insufficient documentation

## 2019-11-15 DIAGNOSIS — Z881 Allergy status to other antibiotic agents status: Secondary | ICD-10-CM | POA: Insufficient documentation

## 2019-11-15 DIAGNOSIS — Z79899 Other long term (current) drug therapy: Secondary | ICD-10-CM | POA: Insufficient documentation

## 2019-11-15 DIAGNOSIS — D649 Anemia, unspecified: Secondary | ICD-10-CM | POA: Insufficient documentation

## 2019-11-15 DIAGNOSIS — E1110 Type 2 diabetes mellitus with ketoacidosis without coma: Secondary | ICD-10-CM | POA: Diagnosis not present

## 2019-11-15 DIAGNOSIS — Z880 Allergy status to penicillin: Secondary | ICD-10-CM | POA: Diagnosis not present

## 2019-11-15 DIAGNOSIS — Z86718 Personal history of other venous thrombosis and embolism: Secondary | ICD-10-CM | POA: Diagnosis not present

## 2019-11-15 LAB — CBC WITH DIFFERENTIAL/PLATELET
Abs Immature Granulocytes: 0.02 10*3/uL (ref 0.00–0.07)
Basophils Absolute: 0 10*3/uL (ref 0.0–0.1)
Basophils Relative: 1 %
Eosinophils Absolute: 0.2 10*3/uL (ref 0.0–0.5)
Eosinophils Relative: 3 %
HCT: 32.4 % — ABNORMAL LOW (ref 36.0–46.0)
Hemoglobin: 10.6 g/dL — ABNORMAL LOW (ref 12.0–15.0)
Immature Granulocytes: 0 %
Lymphocytes Relative: 27 %
Lymphs Abs: 1.7 10*3/uL (ref 0.7–4.0)
MCH: 28.3 pg (ref 26.0–34.0)
MCHC: 32.7 g/dL (ref 30.0–36.0)
MCV: 86.4 fL (ref 80.0–100.0)
Monocytes Absolute: 0.4 10*3/uL (ref 0.1–1.0)
Monocytes Relative: 7 %
Neutro Abs: 4 10*3/uL (ref 1.7–7.7)
Neutrophils Relative %: 62 %
Platelets: 188 10*3/uL (ref 150–400)
RBC: 3.75 MIL/uL — ABNORMAL LOW (ref 3.87–5.11)
RDW: 16.8 % — ABNORMAL HIGH (ref 11.5–15.5)
WBC: 6.4 10*3/uL (ref 4.0–10.5)
nRBC: 0 % (ref 0.0–0.2)

## 2019-11-15 LAB — COMPREHENSIVE METABOLIC PANEL
ALT: 19 U/L (ref 0–44)
AST: 19 U/L (ref 15–41)
Albumin: 4 g/dL (ref 3.5–5.0)
Alkaline Phosphatase: 37 U/L — ABNORMAL LOW (ref 38–126)
Anion gap: 8 (ref 5–15)
BUN: 24 mg/dL — ABNORMAL HIGH (ref 6–20)
CO2: 20 mmol/L — ABNORMAL LOW (ref 22–32)
Calcium: 9.1 mg/dL (ref 8.9–10.3)
Chloride: 109 mmol/L (ref 98–111)
Creatinine, Ser: 1.1 mg/dL — ABNORMAL HIGH (ref 0.44–1.00)
GFR, Estimated: >60 mL/min (ref >60)
Glucose, Bld: 228 mg/dL — ABNORMAL HIGH (ref 70–99)
Potassium: 4.2 mmol/L (ref 3.5–5.1)
Sodium: 137 mmol/L (ref 135–145)
Total Bilirubin: 0.8 mg/dL (ref 0.3–1.2)
Total Protein: 7.9 g/dL (ref 6.5–8.1)

## 2019-11-15 LAB — SEDIMENTATION RATE: Sed Rate: 78 mm/hr — ABNORMAL HIGH (ref 0–20)

## 2019-11-15 LAB — C-REACTIVE PROTEIN: CRP: 0.8 mg/dL (ref <1.0)

## 2019-11-15 MED ORDER — CIPROFLOXACIN HCL 500 MG PO TABS: 500.0000 mg | ORAL_TABLET | Freq: Two times a day (BID) | ORAL | 0 refills | Status: DC

## 2019-11-15 NOTE — Patient Instructions (Signed)
You are here for follow up of the left renal abscess- You are on cipro 500mg  twice aday- the renal drain has been removed after CT done on 10/26 showed much improved abscess- You are witing to have an ultrasound- Continue cipro until the ultrasound is done.

## 2019-11-15 NOTE — Addendum Note (Signed)
Addended by: Clayborne Artist A on: 11/15/2019 02:19 PM   Modules accepted: Orders

## 2019-11-15 NOTE — Progress Notes (Addendum)
NAME: Katelyn Barnes  DOB: 16-Jul-1980  MRN: [id number]  Date/Time: 11/15/2019 10:09 AM   Subjective:  ? Follow up of left renal  Abscess- Last seen on 10/23/19 Started on ciprofloxacin for 30 days Had a repeat Ct scan on 10/26 and the left renal tube was removed on 10/29 as per Dr.Eskridge as she was doing much better and the abscess was resolving with no drainage She is to have another ultrasound soon She is doing well. No fever or chills no flank pain, no hematuria,more energy Is 100% adherent to ciprofloxacin. No side effects- BS okay Her history is as follows  Katelyn Barnes is a 39 y.o. female with a history of IDDM, DVT is referred to me for ongoing infection of the left kidney. Patient was initially admitted on 06/11/19 to [facility] with left lower quadrant pain. 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  She was also in DKA.  Urine and blood culture grew E. coli which was pretty sensitive.  She was given IV cefepime and while in the hospital.  Urology saw her and left ureteric stent placement on 5/31-she was discharged on 06/16/2019 with 2 weeks of cipro and to follow-up with urology as outpatient.  On 07/06/2019 she had a cystoscopy with left retrograde pyelogram as outpatient and there were no abnormal findings.  A new stent was placed.  And she was asked to remove the stent at home in a few days time which she did. Ultrasound was done on 08/14/2019 and that showed collecting system echogenicity with shadowing which made deduction of hydronephrosis difficult..  She saw Dr. Junious Silk on 08/15/2019 and a repeat urine was sent.  It was reported as mixed urogenital flora. . She was seen by urologist in their office on 09/05/2019 and she was complaining of fever and chills and left renal pain.  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  She was sent to the hospital for admission and IV antibiotics.  She underwent CT-guided drain placement on 09/06/2019.  300 cc of purulent fluid was drained.  The abscess culture was E. coli with good susceptibility.  She was on IV ceftriaxone in the hospital and was discharged on 10 days of ciprofloxacin on 09/13/2019 with follow-up as outpatient with urologist.  ID was not consulted during either hospitalization. She saw Dr. Junious Silk as outpatient on 09/28/2019 and a repeat CAT scan was done.  A CT done on 10/17/2019 revealed persistent gas and abscess in the left kidney.  Patient was at this time on Keflex 500 mg p.o. twice daily.  The drain was upsized on 10/19/2019 and I saw the  the patient for further management on 10/23/19 . Started her on cipro and sent cultures from the drain and CBC/CMp- the drain culture had pseudomonas ( which could have been colonizing the drain but it was sensitive to cipro)  Past Medical History:  Diagnosis Date  . DDD (degenerative disc disease), lumbar   . Diabetes mellitus without complication ([place])    Type II  . DVT (deep venous thrombosis) ([place]) 2016   post back surgery - was on Eliquis 3 months  . Kidney stone     Past Surgical History:  Procedure Laterality Date  . ANTERIOR CERVICAL DECOMP/DISCECTOMY FUSION Right  05/05/2016   Procedure: ANTERIOR CERVICAL DECOMPRESSION FUSION, CERVICAL 7- THORACIC 1 WITH INSTRUMENTATION AND ALLOGRAFT;  Surgeon: Phylliss Bob, MD;  Location: [facility];  Service: Orthopedics;  Laterality: Right;  ANTERIOR CERVICAL DECOMPRESSION FUSION, CERVICAL 7- THORACIC 1 WITH INSTRUMENTATION AND ALLOGRAFT  . BACK SURGERY  2016   discectomy   . CARPAL TUNNEL RELEASE Bilateral   . CYSTOSCOPY W/ RETROGRADES Left 07/06/2019   Procedure: CYSTOSCOPY WITH RETROGRADE PYELOGRAM;  Surgeon: Billey Co,  MD;  Location: [facility];  Service: Urology;  Laterality: Left;  . CYSTOSCOPY WITH URETEROSCOPY AND STENT PLACEMENT Left 06/11/2019   Procedure: CYSTOSCOPY WITH URETEROSCOPY AND STENT PLACEMENT;  Surgeon: Festus Aloe, MD;  Location: [facility];  Service: Urology;  Laterality: Left;  . CYSTOSCOPY WITH URETEROSCOPY AND STENT PLACEMENT Left 07/06/2019   Procedure: CYSTOSCOPY WITH URETEROSCOPY AND STENT exchange;  Surgeon: Billey Co, MD;  Location: [facility];  Service: Urology;  Laterality: Left;  . IR NEPHROSTOMY EXCHANGE LEFT  10/19/2019  . SHOULDER ARTHROSCOPY    . TUBAL LIGATION  08/2007    Social History   Socioeconomic History  . Marital status: Legally Separated    Spouse name: Not on file  . Number of children: Not on file  . Years of education: Not on file  . Highest education level: Not on file  Occupational History  . Not on file  Tobacco Use  . Smoking status: Never Smoker  . Smokeless tobacco: Never Used  Vaping Use  . Vaping Use: Never used  Substance and Sexual Activity  . Alcohol use: No  . Drug use: No  . Sexual activity: Yes  Other Topics Concern  . Not on file  Social History Narrative  . Not on file   Social Determinants of Health   Financial Resource Strain:   . Difficulty of Paying Living Expenses: Not on file  Food Insecurity:   . Worried About Charity fundraiser in the Last Year: Not on file  . Ran Out of Food in the Last Year: Not on file  Transportation Needs:   . Lack of Transportation (Medical): Not on file  . Lack of Transportation (Non-Medical): Not on file  Physical Activity:   . Days of Exercise per Week: Not on file  . Minutes of Exercise per Session: Not on file  Stress:   . Feeling of Stress : Not on file  Social Connections:   . Frequency of Communication with Friends and Family: Not on file  . Frequency of Social Gatherings with Friends and Family: Not on file  . Attends Religious Services: Not on file  . Active Member of  Clubs or Organizations: Not on file  . Attends Archivist Meetings: Not on file  . Marital Status: Not on file  Intimate Partner Violence:   . Fear of Current or Ex-Partner: Not on file  . Emotionally Abused: Not on file  . Physically Abused: Not on file  . Sexually Abused: Not on file    No family history on file. Allergies  Allergen Reactions  . Penicillins Rash    14 years ago   . Macrobid [Nitrofurantoin] Nausea And Vomiting    ? Current Outpatient Medications  Medication Sig Dispense Refill  . atorvastatin (LIPITOR) 40 MG tablet Take by mouth.    . cephALEXin (KEFLEX) 500 MG capsule Take 1 capsule (500 mg total) by mouth 2 (two) times daily. (Patient not taking: Reported on 11/09/2019) 60 capsule 0  . ciprofloxacin (CIPRO) 500 MG  tablet Take 1 tablet (500 mg total) by mouth 2 (two) times daily. 60 tablet 0  . ferrous sulfate 325 (65 FE) MG tablet Take by mouth.    . insulin isophane & regular human (HUMULIN 70/30 KWIKPEN) (70-30) 100 UNIT/ML KwikPen Inject 40 Units into the skin 2 (two) times daily with a meal.     . losartan (COZAAR) 50 MG tablet Take 1 tablet (50 mg total) by mouth daily. 30 tablet 0  . metFORMIN (GLUCOPHAGE) 500 MG tablet Take 500 mg by mouth 2 (two) times daily.    . polyethylene glycol (MIRALAX / GLYCOLAX) 17 g packet Take 17 g by mouth daily. (Patient not taking: Reported on 11/09/2019) 14 each 0  . promethazine (PHENERGAN) 12.5 MG tablet Take 1 tablet (12.5 mg total) by mouth every 12 (twelve) hours as needed for nausea or vomiting. 30 tablet 0  . sodium chloride flush (NS) 0.9 % SOLN 10-40 mLs by Intracatheter route every 12 (twelve) hours. 2400 mL 0  . topiramate (TOPAMAX) 25 MG tablet 4 Tablet(s) By Mouth Twice Daily     No current facility-administered medications for this visit.     Abtx:  Anti-infectives (From admission, onward)   None      REVIEW OF SYSTEMS:  Const: negative fever, negative chills, negative weight loss Eyes:  negative diplopia or visual changes, negative eye pain ENT: negative coryza, negative sore throat Resp: negative cough, hemoptysis, dyspnea Cards: negative for chest pain, palpitations, lower extremity edema GU: negative for frequency, dysuria and hematuria, left flank pain GI: Negative for abdominal pain, diarrhea, bleeding, constipation Skin: negative for rash and pruritus Heme: negative for easy bruising and gum/nose bleeding MS: no fatigue or weakness Neurolo: minimal dizziness  Endocrine: Diabetes mellitus Allergy/Immunology- : Penicillin causes rash Macrobid nausea vomiting. Objective:  VITALS: BP 129/79, HR 90. TEMP 98.2, pulse ox 98 PHYSICAL EXAM:  General: Alert, cooperative, no distress, appears stated age.  Pale Head: Normocephalic, without obvious abnormality, atraumatic. Eyes: Conjunctivae clear, anicteric sclerae. Pupils are equal ENT did not examine  neck: Supple, . Back: Left flank drain covered with dressing Minimal purulent fluid in the tube Lungs: Bilateral air entry. Decreased in the bases  Heart: S1-S2 Abdomen: Soft, non-tender,not distended. Bowel sounds normal. No masses Extremities: atraumatic, no cyanosis. No edema. No clubbing Skin: No rashes or lesions. Or bruising Lymph: Cervical, supraclavicular normal. Neurologic: Grossly non-focal Pertinent Labs Lab Results CBC    Component Value Date/Time   WBC 9.4 10/23/2019 1035   RBC 3.83 (L) 10/23/2019 1035   HGB 10.3 (L) 10/23/2019 1035   HGB 14.0 05/27/2013 1839   HCT 31.6 (L) 10/23/2019 1035   HCT 42.2 05/27/2013 1839   PLT 256 10/23/2019 1035   PLT 168 05/27/2013 1839   MCV 82.5 10/23/2019 1035   MCV 87 05/27/2013 1839   MCH 26.9 10/23/2019 1035   MCHC 32.6 10/23/2019 1035   RDW 16.3 (H) 10/23/2019 1035   RDW 13.8 05/27/2013 1839   LYMPHSABS 1.9 10/23/2019 1035   LYMPHSABS 2.8 05/27/2013 1839   MONOABS 0.6 10/23/2019 1035   MONOABS 0.3 05/27/2013 1839   EOSABS 0.0 10/23/2019 1035    EOSABS 0.2 05/27/2013 1839   BASOSABS 0.0 10/23/2019 1035   BASOSABS 0.1 05/27/2013 1839    CMP Latest Ref Rng & Units 10/23/2019 09/12/2019 09/09/2019  Glucose 70 - 99 mg/dL 124(H) 225(H) 204(H)  BUN 6 - 20 mg/dL 22(H) 28(H) 16  Creatinine 0.44 - 1.00 mg/dL 0.79 0.85 0.71  Sodium 135 -  145 mmol/L 139 134(L) 135  Potassium 3.5 - 5.1 mmol/L 4.0 4.3 3.9  Chloride 98 - 111 mmol/L 109 102 104  CO2 22 - 32 mmol/L 19(L) 24 21(L)  Calcium 8.9 - 10.3 mg/dL 9.7 9.3 8.6(L)  Total Protein 6.5 - 8.1 g/dL 8.1 - -  Total Bilirubin 0.3 - 1.2 mg/dL 1.1 - -  Alkaline Phos 38 - 126 U/L 50 - -  AST 15 - 41 U/L 20 - -  ALT 0 - 44 U/L 18 - -     06/11/19   09/05/19    10/17/19  IMAGING RESULTS: I have personally reviewed the films 11/06/19 ?   Heterogeneous enhancement of the left upper kidney. 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  She has been on antibiotics since August 2021 initially was on IV and then ciprofloxacin and then suppressive Keflex 500 mg twice daily. The last urine culture has been from August and and it showed susceptible E. coli. She has had stents initially in the left ureter in August and now recently she has had drains placed in the left kidney area.  On 10/19/2019 the drain was upsized. I saw her on 10/12 and stopped keflex and started on cipro 514m PO BID for a month CBC, CMP, ESR and CRP.Okay  Discussed with Dr. EJunious Silkand a repeat CAT scan was done on 11/06/19- . Dr..Eskeriidge removed the tube on 11/09/19 after reviewing  the films and discussing with  IR time and felt there was significant improvement. She is continuing cipro5059mPO bID for another 2 weeks   Diabetes mellitus on insulin and Metformin. Peripheral neuropathy on topiramate Anemia on iron  supplements  Pharmacist resident saw the patient during this visit and discussed cipro and side effects  ? Will repeat CBC. CMP. ESR.CRP today Follow PRN ___________________________________________________ Discussed with patient in detail Note:  This document was prepared using Dragon voice recognition software and may include unintentional dictation errors.

## 2019-11-28 ENCOUNTER — Ambulatory Visit
Admission: RE | Admit: 2019-11-28 | Discharge: 2019-11-28 | Disposition: A | Discharge status: Home / Self Care | Payer: Medicaid Other | Source: Ambulatory Visit | Attending: Urology | Admitting: Urology

## 2019-11-28 ENCOUNTER — Other Ambulatory Visit: Payer: Self-pay

## 2019-11-28 DIAGNOSIS — N151 Renal and perinephric abscess: Secondary | ICD-10-CM | POA: Diagnosis present

## 2019-12-03 ENCOUNTER — Telehealth: Payer: Self-pay | Admitting: *Deleted

## 2019-12-03 ENCOUNTER — Telehealth: Payer: Self-pay

## 2019-12-03 NOTE — Telephone Encounter (Signed)
Notified patient as advised, patient verbalized understanding.  

## 2019-12-03 NOTE — Telephone Encounter (Signed)
Pt calling stating she ran of of abx and was wondering after her ultrasound does she need to continue? Pt stated she called ID and no one returned her call about the abx. Please advise  (pt has appt on Wednesday to discuss [place])

## 2019-12-03 NOTE — Telephone Encounter (Signed)
Notified patient as advised, patient vpoiced understanding.

## 2019-12-03 NOTE — Telephone Encounter (Signed)
-----   Message from Jerilee Field, MD sent at 11/29/2019  7:28 PM EST ----- Let Katelyn Barnes know I was really pleased with her renal US. Left kidney looks good. There was a little area above the kidney of fluid but no obvious gas/air, infection or abscess. Looks good!  ----- Message ----- From: Levada Schilling, CMA Sent: 11/29/2019  10:50 AM EST To: Jerilee Field, MD   ----- Message ----- From: Interface, Rad Results In Sent: 11/29/2019  10:48 AM EST To: [name]

## 2019-12-05 ENCOUNTER — Encounter: Payer: Self-pay | Admitting: Urology

## 2019-12-05 ENCOUNTER — Ambulatory Visit (INDEPENDENT_AMBULATORY_CARE_PROVIDER_SITE_OTHER): Payer: Medicaid Other | Admitting: Urology

## 2019-12-05 ENCOUNTER — Other Ambulatory Visit: Payer: Self-pay

## 2019-12-05 VITALS — BP 106/72 | HR 92 | Ht 66.0 in | Wt 211.0 lb

## 2019-12-05 DIAGNOSIS — R8271 Bacteriuria: Secondary | ICD-10-CM | POA: Diagnosis not present

## 2019-12-05 DIAGNOSIS — N151 Renal and perinephric abscess: Secondary | ICD-10-CM

## 2019-12-05 DIAGNOSIS — N261 Atrophy of kidney (terminal): Secondary | ICD-10-CM

## 2019-12-05 NOTE — Progress Notes (Signed)
12/05/2019 10:24 AM   Katelyn Barnes [birth year] [id number]  Referring provider: Cathie Hoops, PA [address] [place],  [place] [address]  Chief Complaint  Patient presents with  . Follow-up    HPI:  Please see 11/09/2019 note for detailed history. She returns today for symptom check, UA and to review recent renal US. I removed the the left renal/peri-renal drain 11/09/2019 after reviewing images with IR. The patient continued three more weeks of Cipro. She underwent left renal US which did not show any air/gas or fluid collection. There was a heterogeneous area without a definite fluid component in the midpole measuring 3.9 x 3.6 x 3.7 cm. Recall the abscess was more upper pole, peri-renal. The right kidney measured 13 cm and left 10.7 cm. She is doing well with no fever or chills. No dysuria. No flank pain.   PMH: Past Medical History:  Diagnosis Date  . DDD (degenerative disc disease), lumbar   . Diabetes mellitus without complication ([facility])    Type II  . DVT (deep venous thrombosis) ([facility]) 2016   post back surgery - was on Eliquis 3 months  . Kidney stone     Surgical History: Past Surgical History:  Procedure Laterality Date  . ANTERIOR CERVICAL DECOMP/DISCECTOMY FUSION Right 05/05/2016   Procedure: ANTERIOR CERVICAL DECOMPRESSION FUSION, CERVICAL 7- THORACIC 1 WITH INSTRUMENTATION AND ALLOGRAFT;  Surgeon: Estill Bamberg, MD;  Location: [facility];  Service: Orthopedics;  Laterality: Right;  ANTERIOR CERVICAL DECOMPRESSION FUSION, CERVICAL 7- THORACIC 1 WITH INSTRUMENTATION AND ALLOGRAFT  . BACK SURGERY  2016   discectomy   . CARPAL TUNNEL RELEASE Bilateral   . CYSTOSCOPY W/ RETROGRADES Left 07/06/2019   Procedure: CYSTOSCOPY WITH RETROGRADE PYELOGRAM;  Surgeon: [name], MD;  Location: [facility];  Service: Urology;  Laterality: Left;  . CYSTOSCOPY WITH URETEROSCOPY AND STENT PLACEMENT Left 06/11/2019   Procedure: CYSTOSCOPY WITH URETEROSCOPY AND  STENT PLACEMENT;  Surgeon: Jerilee Field, MD;  Location: [facility];  Service: Urology;  Laterality: Left;  . CYSTOSCOPY WITH URETEROSCOPY AND STENT PLACEMENT Left 07/06/2019   Procedure: CYSTOSCOPY WITH URETEROSCOPY AND STENT exchange;  Surgeon: [name], MD;  Location: [facility];  Service: Urology;  Laterality: Left;  . IR NEPHROSTOMY EXCHANGE LEFT  10/19/2019  . SHOULDER ARTHROSCOPY    . TUBAL LIGATION  08/2007    Home Medications:  Allergies as of 12/05/2019      Reactions   Penicillins Rash   14 years ago   Macrobid [nitrofurantoin] Nausea And Vomiting      Medication List       Accurate as of December 05, 2019 10:24 AM. If you have any questions, ask your nurse or doctor.        STOP taking these medications   ciprofloxacin 500 MG tablet Commonly known as: Cipro Stopped by: Jerilee Field, MD     TAKE these medications   atorvastatin 40 MG tablet Commonly known as: LIPITOR Take 40 mg by mouth.   ferrous sulfate 325 (65 FE) MG tablet Take 325 mg by mouth.   HumuLIN 70/30 KwikPen (70-30) 100 UNIT/ML KwikPen Generic drug: insulin isophane & regular human Inject 40 Units into the skin 2 (two) times daily with a meal.   losartan 50 MG tablet Commonly known as: COZAAR Take 1 tablet (50 mg total) by mouth daily.   metFORMIN 500 MG tablet Commonly known as: GLUCOPHAGE Take 500 mg by mouth 2 (two) times daily.   multivitamin with minerals  tablet Take 1 tablet by mouth daily.   promethazine 12.5 MG tablet Commonly known as: PHENERGAN Take 1 tablet (12.5 mg total) by mouth every 12 (twelve) hours as needed for nausea or vomiting.   topiramate 25 MG tablet Commonly known as: TOPAMAX 4 Tablet(s) By Mouth Twice Daily       Allergies:  Allergies  Allergen Reactions  . Penicillins Rash    14 years ago   . Macrobid [Nitrofurantoin] Nausea And Vomiting    Family History: History reviewed. No pertinent family history.  Social History:  reports  that she has never smoked. She has never used smokeless tobacco. She reports that she does not drink alcohol and does not use drugs.   Physical Exam: BP 106/72   Pulse 92   Ht 5\' 6"  (1.676 m)   Wt 211 lb (95.7 kg)   BMI 34.06 kg/m   Constitutional:  Alert and oriented, No acute distress. HEENT: [place] AT, moist mucus membranes.  Trachea midline, no masses. Cardiovascular: No clubbing, cyanosis, or edema. Respiratory: Normal respiratory effort, no increased work of breathing. GI: Abdomen is soft, nontender, nondistended, no abdominal masses GU: No CVA tenderness Skin: No rashes, bruises or suspicious lesions. Neurologic: Grossly intact, no focal deficits, moving all 4 extremities. Psychiatric: Normal mood and affect.  Laboratory Data: Lab Results  Component Value Date   WBC 6.4 11/15/2019   HGB 10.6 (L) 11/15/2019   HCT 32.4 (L) 11/15/2019   MCV 86.4 11/15/2019   PLT 188 11/15/2019    Lab Results  Component Value Date   CREATININE 1.10 (H) 11/15/2019    No results found for: PSA  No results found for: TESTOSTERONE  Lab Results  Component Value Date   HGBA1C 7.5 (H) 09/05/2019    Urinalysis    Component Value Date/Time   COLORURINE YELLOW (A) 06/11/2019 0859   APPEARANCEUR Cloudy (A) 09/05/2019 1304   LABSPEC 1.015 06/11/2019 0859   LABSPEC 1.037 05/27/2013 1839   PHURINE 5.0 06/11/2019 0859   GLUCOSEU Negative 09/05/2019 1304   GLUCOSEU >=500 05/27/2013 1839   HGBUR MODERATE (A) 06/11/2019 0859   BILIRUBINUR Negative 09/05/2019 1304   BILIRUBINUR Negative 05/27/2013 1839   KETONESUR 5 (A) 06/11/2019 0859   PROTEINUR 1+ (A) 09/05/2019 1304   PROTEINUR 100 (A) 06/11/2019 0859   NITRITE Negative 09/05/2019 1304   NITRITE NEGATIVE 06/11/2019 0859   LEUKOCYTESUR 1+ (A) 09/05/2019 1304   LEUKOCYTESUR TRACE (A) 06/11/2019 0859   LEUKOCYTESUR Negative 05/27/2013 1839    Lab Results  Component Value Date   LABMICR See below: 09/05/2019   WBCUA 11-30 (A)  09/05/2019   LABEPIT 0-10 09/05/2019   BACTERIA Many (A) 09/05/2019    Pertinent Imaging: CT 10/21, 11/21 11/21  No results found for this or any previous visit.  No results found for this or any previous visit.  No results found for this or any previous visit.  No results found for this or any previous visit.  Results for orders placed during the hospital encounter of 11/28/19  11/30/19 RENAL  Narrative CLINICAL DATA:  Evaluate for left renal abscess  EXAM: RENAL / URINARY TRACT ULTRASOUND COMPLETE  COMPARISON:  CT abdomen pelvis 11/06/2019  FINDINGS: Right Kidney:  Renal measurements: 13.0 x 5.8 x 6.3 cm = volume: 249 mL. Echogenicity within normal limits. No mass or hydronephrosis visualized.  Left Kidney:  Renal measurements: 10.7 x 5.6 x 4.9 cm = volume: 156 mL. Echogenicity within normal limits. No hydronephrosis. There is an indeterminate heterogeneous  area without a definite fluid component in the midpole measuring 3.9 x 3.6 x 3.7 cm.  Bladder:  Appears normal for degree of bladder distention. Small amount of debris.  Other:  None.  IMPRESSION: 1. Indeterminate heterogeneous area in the midpole of the left kidney measuring up to 3.9 cm without a definite fluid component. Findings could represent phlegmonous change though hematoma or loculated abscess not excluded.  2.  Normal sonographic appearance of the right kidney.  3.  Small amount of debris in the bladder.   Electronically Signed By: Emmaline Kluver M.D. On: 11/29/2019 10:45  No results found for this or any previous visit.  No results found for this or any previous visit.  Results for orders placed during the hospital encounter of 09/05/19  CT RENAL STONE STUDY  Narrative CLINICAL DATA:  Left flank pain with nausea and chills for 3 days. History of left urinary tract obstruction with stent approximately 2-3 months ago.  EXAM: CT ABDOMEN AND PELVIS WITHOUT  CONTRAST  TECHNIQUE: Multidetector CT imaging of the abdomen and pelvis was performed following the standard protocol without IV contrast.  COMPARISON:  08/14/2019 renal ultrasound.  Most recent CT 06/11/2019  FINDINGS: Lower chest: Clear lung bases. Normal heart size without pericardial or pleural effusion.  Hepatobiliary: Normal liver. Normal gallbladder, without biliary ductal dilatation.  Pancreas: Normal, without mass or ductal dilatation.  Spleen: Normal in size, without focal abnormality.  Adrenals/Urinary Tract: Normal right adrenal gland. The left adrenal is poorly evaluated. Normal right kidney, without stone, hydronephrosis. No bladder calculi.  Significant progression of left renal infection. Normal renal morphology is essentially replaced (small amount of retained parenchyma is suspected in the lower pole left kidney on 48/2) by a gas and fluid collection, most consistent with abscess. Example 9.8 x 10.5 cm on 35/2. 12.3 cm craniocaudal on 77 coronal. Displacement of the descending colon laterally and adjacent small bowel loops minimally anteriorly.  No hydroureter or ureteric stone.  Stomach/Bowel: Normal stomach, without wall thickening. The descending colon is intimately associated with the left renal process, without well-defined fistulous communication identified. Example 47/2. Normal terminal ileum and appendix. Normal small bowel.  Vascular/Lymphatic: Aortic atherosclerosis. Left periaortic 1.0 cm node on 39/2 is similar, likely reactive. No pelvic sidewall adenopathy.  Reproductive: Normal uterus and adnexa.  Tubal ligation.  Other: No significant free fluid. Mild pelvic floor laxity. No free intraperitoneal air.  Musculoskeletal: Lumbar spondylosis.  IMPRESSION: 1. 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 2. Borderline abdominal adenopathy, likely reactive. 3.  Aortic Atherosclerosis (ICD10-I70.0).  This is age advanced.  These results will be called to the ordering clinician or representative by the Radiologist Assistant, and communication documented in the PACS or [employer].   Electronically Signed By: Jeronimo Greaves M.D. On: 09/05/2019 14:53   Assessment & Plan:    1. Abscess, renal/perirenal - resolved but with a heterogenous area in the mid-pole. Will continue to monitor and repeat a renal US in 6 weeks.    2. Renal atrophy - We talked the left kidney is measuring smaller (atrophy) and it might scar down some. Check [place] as above.   3. bacteriuria - Her UA does have moderate bacteria but she is asymptomatic. I sent for culture and I might treat it if + given her recent history. - Urinalysis, Complete  No follow-ups on file.  Jerilee Field, MD  [facility] [address] [place], [place] [address] [phone]

## 2019-12-10 LAB — CULTURE, URINE COMPREHENSIVE

## 2019-12-11 LAB — MICROSCOPIC EXAMINATION

## 2019-12-11 LAB — URINALYSIS, COMPLETE
Bilirubin, UA: NEGATIVE
Glucose, UA: NEGATIVE
Ketones, UA: NEGATIVE
Nitrite, UA: NEGATIVE
Protein,UA: NEGATIVE
Specific Gravity, UA: 1.02 (ref 1.005–1.030)
Urobilinogen, Ur: 0.2 mg/dL (ref 0.2–1.0)
pH, UA: 5 (ref 5.0–7.5)

## 2020-01-15 ENCOUNTER — Other Ambulatory Visit: Payer: Self-pay

## 2020-01-15 ENCOUNTER — Ambulatory Visit
Admission: RE | Admit: 2020-01-15 | Discharge: 2020-01-15 | Disposition: A | Discharge status: Home / Self Care | Payer: Medicaid Other | Source: Ambulatory Visit | Attending: Urology | Admitting: Urology

## 2020-01-15 DIAGNOSIS — N261 Atrophy of kidney (terminal): Secondary | ICD-10-CM | POA: Diagnosis not present

## 2020-01-16 ENCOUNTER — Ambulatory Visit (INDEPENDENT_AMBULATORY_CARE_PROVIDER_SITE_OTHER): Payer: Medicaid Other | Admitting: Urology

## 2020-01-16 ENCOUNTER — Encounter: Payer: Self-pay | Admitting: Urology

## 2020-01-16 VITALS — BP 100/66 | HR 93 | Ht 66.0 in | Wt 210.0 lb

## 2020-01-16 DIAGNOSIS — N12 Tubulo-interstitial nephritis, not specified as acute or chronic: Secondary | ICD-10-CM

## 2020-01-16 DIAGNOSIS — N118 Other chronic tubulo-interstitial nephritis: Secondary | ICD-10-CM

## 2020-01-16 NOTE — Patient Instructions (Signed)
A one-time earlier cases yesterday

## 2020-01-16 NOTE — Progress Notes (Signed)
   01/16/2020 9:55 AM   Katelyn Barnes 1980-09-18 [id number]  Reason for visit: Follow up left pyelonephritis and renal abscesses  HPI: I saw Katelyn Barnes back in urology clinic for follow-up of the above issues.  She is a 40 year old female with complex urologic history.  She was primarily managed by Dr. Mena Goes, but is transitioning over to be as he is no longer covering clinic at [facility].  Briefly, she has a history of left hydronephrosis and pyelonephritis in May 2021 requiring an urgent left ureteral stent.  She underwent a follow-up diagnostic ureteroscopy with me in June 2021 that was completely normal with no left ureteral abnormalities or hydronephrosis, and her stent was removed.  She developed fevers at the end of August 2021 and CT showed a new 10 cm left renal abscess with impressive gas within the kidney and collecting system and she was admitted for IR percutaneous drain placement.  She required a follow-up additional drain placement for persistent abscess.  She was ultimately treated with a prolonged course of culture appropriate Cipro.  She is also been followed by infectious disease.  Drain was removed on 11/09/2019.  I reviewed the notes from infectious disease and from Dr. Mena Goes.  She has been doing well since that time.  She denies any urinary symptoms or left-sided flank pain.  Urine culture 12/05/2019 showed no significant growth.  A follow-up renal ultrasound on 11/29/2019 showed improving heterogenous area in the midpole of the left kidney measuring 4 cm, and Dr. Mena Goes recommended close follow-up with repeat ultrasound in 1 to 2 months.  I personally viewed and interpreted her ultrasound yesterday that shows no hydronephrosis or other abnormalities aside from slightly atrophic left kidney.  With a long conversation today about her imaging findings and complex history over the last 6 months.  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  We also discussed the need to monitor the renal function and kidney size with a repeat ultrasound in 6 months, and if this is normal and additional ultrasound in 1 year.  We discussed UTI prevention strategies at length including addition of cranberry tablet prophylaxis.  We discussed return precautions extensively.  RTC 6 months with renal ultrasound and BMP prior  [name], MD  [facility] [address], [address] [place], [place] [address] [phone]

## 2020-01-18 ENCOUNTER — Telehealth: Payer: Self-pay

## 2020-01-18 NOTE — Telephone Encounter (Signed)
-----   Message from Jerilee Field, MD sent at 01/18/2020  3:27 PM EST ----- Please let Katelyn Barnes know her ultrasound of the kidneys looks great! I dont see any sign of any fluid or infection in the left kidney. The left kidney does have some scarring or is smaller than the right (what they call "atrophy" in the [place] report), but that's just a result of the severe infection and abscess of the left kidney and a much better result than if she had needed the left kidney removed.   ----- Message ----- From: Levada Schilling, CMA Sent: 01/16/2020   7:53 AM EST To: Jerilee Field, MD   ----- Message ----- From: Interface, Rad Results In Sent: 01/15/2020   4:32 PM EST To: [name] Clinical

## 2020-01-18 NOTE — Telephone Encounter (Signed)
Per DPR, [facility] notifying patient, patient verbalized understanding.

## 2020-02-18 ENCOUNTER — Telehealth: Payer: Self-pay

## 2020-02-18 ENCOUNTER — Other Ambulatory Visit
Admission: RE | Admit: 2020-02-18 | Discharge: 2020-02-18 | Disposition: A | Discharge status: Home / Self Care | Payer: Medicaid Other | Source: Home / Self Care | Attending: Urology | Admitting: Urology

## 2020-02-18 ENCOUNTER — Encounter: Payer: Self-pay | Admitting: Urology

## 2020-02-18 ENCOUNTER — Ambulatory Visit (INDEPENDENT_AMBULATORY_CARE_PROVIDER_SITE_OTHER): Payer: Medicaid Other | Admitting: Urology

## 2020-02-18 ENCOUNTER — Ambulatory Visit
Admission: RE | Admit: 2020-02-18 | Discharge: 2020-02-18 | Disposition: A | Discharge status: Home / Self Care | Payer: Medicaid Other | Source: Ambulatory Visit | Attending: Urology | Admitting: Urology

## 2020-02-18 ENCOUNTER — Other Ambulatory Visit: Payer: Self-pay

## 2020-02-18 VITALS — BP 99/69 | HR 87 | Temp 99.4°F | Ht 66.0 in | Wt 221.0 lb

## 2020-02-18 DIAGNOSIS — R109 Unspecified abdominal pain: Secondary | ICD-10-CM

## 2020-02-18 DIAGNOSIS — N10 Acute tubulo-interstitial nephritis: Secondary | ICD-10-CM

## 2020-02-18 DIAGNOSIS — N118 Other chronic tubulo-interstitial nephritis: Secondary | ICD-10-CM

## 2020-02-18 LAB — BASIC METABOLIC PANEL
Anion gap: 12 (ref 5–15)
BUN: 23 mg/dL — ABNORMAL HIGH (ref 6–20)
CO2: 19 mmol/L — ABNORMAL LOW (ref 22–32)
Calcium: 9.2 mg/dL (ref 8.9–10.3)
Chloride: 109 mmol/L (ref 98–111)
Creatinine, Ser: 1.03 mg/dL — ABNORMAL HIGH (ref 0.44–1.00)
GFR, Estimated: >60 mL/min (ref >60)
Glucose, Bld: 98 mg/dL (ref 70–99)
Potassium: 4.1 mmol/L (ref 3.5–5.1)
Sodium: 140 mmol/L (ref 135–145)

## 2020-02-18 LAB — PREGNANCY, URINE: Preg Test, Ur: NEGATIVE

## 2020-02-18 LAB — CBC WITH DIFFERENTIAL/PLATELET
Abs Immature Granulocytes: 0.02 10*3/uL (ref 0.00–0.07)
Basophils Absolute: 0 10*3/uL (ref 0.0–0.1)
Basophils Relative: 1 %
Eosinophils Absolute: 0.1 10*3/uL (ref 0.0–0.5)
Eosinophils Relative: 2 %
HCT: 35.8 % — ABNORMAL LOW (ref 36.0–46.0)
Hemoglobin: 11.7 g/dL — ABNORMAL LOW (ref 12.0–15.0)
Immature Granulocytes: 0 %
Lymphocytes Relative: 34 %
Lymphs Abs: 2.1 10*3/uL (ref 0.7–4.0)
MCH: 29.8 pg (ref 26.0–34.0)
MCHC: 32.7 g/dL (ref 30.0–36.0)
MCV: 91.3 fL (ref 80.0–100.0)
Monocytes Absolute: 0.4 10*3/uL (ref 0.1–1.0)
Monocytes Relative: 6 %
Neutro Abs: 3.6 10*3/uL (ref 1.7–7.7)
Neutrophils Relative %: 57 %
Platelets: 181 10*3/uL (ref 150–400)
RBC: 3.92 MIL/uL (ref 3.87–5.11)
RDW: 13.6 % (ref 11.5–15.5)
WBC: 6.3 10*3/uL (ref 4.0–10.5)
nRBC: 0 % (ref 0.0–0.2)

## 2020-02-18 LAB — URINALYSIS, COMPLETE (UACMP) WITH MICROSCOPIC
Bilirubin Urine: NEGATIVE
Glucose, UA: NEGATIVE mg/dL
Hgb urine dipstick: NEGATIVE
Ketones, ur: NEGATIVE mg/dL
Nitrite: NEGATIVE
Protein, ur: NEGATIVE mg/dL
RBC / HPF: NONE SEEN RBC/hpf (ref 0–5)
Specific Gravity, Urine: 1.02 (ref 1.005–1.030)
pH: 5.5 (ref 5.0–8.0)

## 2020-02-18 LAB — LACTIC ACID, PLASMA: Lactic Acid, Venous: 1.6 mmol/L (ref 0.5–1.9)

## 2020-02-18 MED ORDER — CIPROFLOXACIN HCL 500 MG PO TABS: 500.0000 mg | ORAL_TABLET | Freq: Two times a day (BID) | ORAL | 0 refills | Status: DC

## 2020-02-18 MED ORDER — CEFTRIAXONE SODIUM 1 G IJ SOLR
1.0000 g | Freq: Once | INTRAMUSCULAR | Status: AC
  Administered 2020-02-18: 1 g via INTRAMUSCULAR

## 2020-02-18 NOTE — Telephone Encounter (Signed)
Patient called today stating that she starting having flank pain and nausea on Friday. She continued to not feel well all weekend and did vomit a few times. Her flank pain has worsened and she is still nauseous, with her history she is concerned. She denies fever, chills, and no urinary symptoms. Per shannon patient was added on to schedule today to be seen. Stat labs prior with UA and culture. Patient verbalized understanding

## 2020-02-18 NOTE — Progress Notes (Signed)
02/18/2020 6:03 PM   Katelyn Barnes 1980/09/07 [id number]  Referring provider: Cathie Hoops, PA [address] [place],  [place] [address]  Chief Complaint  Patient presents with  . Acute Visit    Flank pain and nausea   Urological history 1. 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 Left renal abcess - underwent IR drainage 09/28/2019  3. 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 female who presents today complaining of nausea and left sided pain.  She had intense nausea that woke her up late Friday night.  When she awoke she also noted that she had left-sided pain that radiated into the left groin.  She has not actually vomited, but she feels like acid is coming into her mouth.  She has been able to keep the reflux down.  The nausea is worse laying down.  The pain does not change with movements.  She was at the end of her menstrual cycle and it ceased on Saturday.  Patient denies any modifying or aggravating factors.  Patient denies any gross hematuria or dysuria.  Patient denies any fevers, chills or vomiting.   WBC count 6.3, serum creatinine 1.03, lactic acid 1.6 and negative pregnancy test.  UA positive for 11-20 WBCs, few bacteria and white blood cell clumps.  RUS findings concerning for pyelonephritis.  PMH: Past Medical History:  Diagnosis Date  . DDD (degenerative disc disease), lumbar   . Diabetes mellitus without complication (HCC)    Type II  . DVT (deep venous thrombosis) (HCC) 2016   post back surgery - was on Eliquis 3 months  . Kidney stone     Surgical History: Past Surgical History:  Procedure Laterality Date  . ANTERIOR CERVICAL DECOMP/DISCECTOMY FUSION Right 05/05/2016   Procedure: ANTERIOR CERVICAL DECOMPRESSION FUSION, CERVICAL 7- THORACIC 1 WITH INSTRUMENTATION AND ALLOGRAFT;  Surgeon: Estill Bamberg, MD;  Location: [facility] OR;  Service:  Orthopedics;  Laterality: Right;  ANTERIOR CERVICAL DECOMPRESSION FUSION, CERVICAL 7- THORACIC 1 WITH INSTRUMENTATION AND ALLOGRAFT  . BACK SURGERY  2016   discectomy   . CARPAL TUNNEL RELEASE Bilateral   . CYSTOSCOPY W/ RETROGRADES Left 07/06/2019   Procedure: CYSTOSCOPY WITH RETROGRADE PYELOGRAM;  Surgeon: [name], MD;  Location: [facility] ORS;  Service: Urology;  Laterality: Left;  . CYSTOSCOPY WITH URETEROSCOPY AND STENT PLACEMENT Left 06/11/2019   Procedure: CYSTOSCOPY WITH URETEROSCOPY AND STENT PLACEMENT;  Surgeon: Jerilee Field, MD;  Location: [facility] ORS;  Service: Urology;  Laterality: Left;  . CYSTOSCOPY WITH URETEROSCOPY AND STENT PLACEMENT Left 07/06/2019   Procedure: CYSTOSCOPY WITH URETEROSCOPY AND STENT exchange;  Surgeon: [name], MD;  Location: [facility] ORS;  Service: Urology;  Laterality: Left;  . IR NEPHROSTOMY EXCHANGE LEFT  10/19/2019  . SHOULDER ARTHROSCOPY    . TUBAL LIGATION  08/2007    Home Medications:  Allergies as of 02/18/2020      Reactions   Penicillins Rash   14 years ago   Macrobid [nitrofurantoin] Nausea And Vomiting      Medication List       Accurate as of February 18, 2020 11:59 PM. If you have any questions, ask your nurse or doctor.        STOP taking these medications   promethazine 12.5 MG tablet Commonly known as: PHENERGAN Stopped by: Michiel Cowboy, PA-C     TAKE these medications   atorvastatin 40 MG tablet Commonly  known as: LIPITOR Take 40 mg by mouth.   ciprofloxacin 500 MG tablet Commonly known as: CIPRO Take 1 tablet (500 mg total) by mouth every 12 (twelve) hours. Started by: Michiel Cowboy, PA-C   ferrous sulfate 325 (65 FE) MG tablet Take 325 mg by mouth.   HumuLIN 70/30 KwikPen (70-30) 100 UNIT/ML KwikPen Generic drug: insulin isophane & regular human Inject 40 Units into the skin 2 (two) times daily with a meal.   losartan 50 MG tablet Commonly known as: COZAAR Take 1 tablet (50 mg total) by mouth  daily.   metFORMIN 500 MG tablet Commonly known as: GLUCOPHAGE Take 500 mg by mouth 2 (two) times daily.   multivitamin with minerals tablet Take 1 tablet by mouth daily.   topiramate 25 MG tablet Commonly known as: TOPAMAX 4 Tablet(s) By Mouth Twice Daily       Allergies:  Allergies  Allergen Reactions  . Penicillins Rash    14 years ago   . Macrobid [Nitrofurantoin] Nausea And Vomiting    Family History: No family history on file.  Social History:  reports that she has never smoked. She has never used smokeless tobacco. She reports that she does not drink alcohol and does not use drugs.  ROS: Pertinent ROS in HPI  Physical Exam: BP 99/69   Pulse 87   Temp 99.4 F (37.4 C) (Oral)   Ht 5\' 6"  (1.676 m)   Wt 221 lb (100.2 kg)   LMP 02/15/2020 (Exact Date)   BMI 35.67 kg/m   Constitutional:  Well nourished. Alert and oriented, No acute distress. HEENT: [place] AT, mask in place.  Trachea midline Cardiovascular: No clubbing, cyanosis, or edema. Respiratory: Normal respiratory effort, no increased work of breathing. GU: Left CVA tenderness.  No bladder fullness or masses.   Neurologic: Grossly intact, no focal deficits, moving all 4 extremities. Psychiatric: Normal mood and affect.   Laboratory Data: Component     Latest Ref Rng & Units 02/18/2020          WBC     4.0 - 10.5 K/uL 6.3  RBC     3.87 - 5.11 MIL/uL 3.92  Hemoglobin     12.0 - 15.0 g/dL 04/17/2020 (L)  HCT     24.8 - 46.0 % 35.8 (L)  MCV     80.0 - 100.0 fL 91.3  MCH     26.0 - 34.0 pg 29.8  MCHC     30.0 - 36.0 g/dL 25.0  RDW     03.7 - 04.8 % 13.6  Platelets     150 - 400 K/uL 181  nRBC     0.0 - 0.2 % 0.0  Neutrophils     % 57  NEUT#     1.7 - 7.7 K/uL 3.6  Lymphocytes     % 34  Lymphocyte #     0.7 - 4.0 K/uL 2.1  Monocytes Relative     % 6  Monocyte #     0.1 - 1.0 K/uL 0.4  Eosinophil     % 2  Eosinophils Absolute     0.0 - 0.5 K/uL 0.1  Basophil     % 1  Basophils  Absolute     0.0 - 0.1 K/uL 0.0  Immature Granulocytes     % 0  Abs Immature Granulocytes     0.00 - 0.07 K/uL 0.02  WBC, UA     0 - 5 /hpf   Epithelial Cells (non renal)  0 - 10 /hpf   Crystals     N/A   Crystal Type     N/A   Bacteria, UA     None seen/Few   Yeast, UA     None seen   Renal Epithel, UA     None seen /hpf    Component     Latest Ref Rng & Units 02/18/2020          Sodium     135 - 145 mmol/L 140  Potassium     3.5 - 5.1 mmol/L 4.1  Chloride     98 - 111 mmol/L 109  CO2     22 - 32 mmol/L 19 (L)  Glucose     70 - 99 mg/dL 98  BUN     6 - 20 mg/dL 23 (H)  Creatinine     0.44 - 1.00 mg/dL 7.34 (H)  Calcium     8.9 - 10.3 mg/dL 9.2  GFR, Estimated     >60 mL/min >60  Anion gap     5 - 15 12   Component     Latest Ref Rng & Units 02/18/2020          Lactic Acid, Venous     0.5 - 1.9 mmol/L 1.6    Urinalysis Component     Latest Ref Rng & Units 02/18/2020          Color, Urine     YELLOW YELLOW  Appearance     CLEAR HAZY (A)  Specific Gravity, Urine     1.005 - 1.030 1.020  pH     5.0 - 8.0 5.5  Glucose, UA     NEGATIVE mg/dL NEGATIVE  Hgb urine dipstick     NEGATIVE NEGATIVE  Bilirubin Urine     NEGATIVE NEGATIVE  Ketones, ur     NEGATIVE mg/dL NEGATIVE  Protein     NEGATIVE mg/dL NEGATIVE  Nitrite     NEGATIVE NEGATIVE  Leukocytes,Ua     NEGATIVE TRACE (A)  Squamous Epithelial / LPF     0 - 5 0-5  WBC, UA     0 - 5 WBC/hpf 11-20  RBC / HPF     0 - 5 RBC/hpf NONE SEEN  Bacteria, UA     NONE SEEN FEW (A)  WBC Clumps      PRESENT   I have reviewed the labs.   Pertinent Imaging: Narrative & Impression  CLINICAL DATA:  Left flank pain, history of renal abscess  EXAM: RENAL / URINARY TRACT ULTRASOUND COMPLETE  COMPARISON:  Renal ultrasound January 15, 2020 and November 28, 2019.  FINDINGS: Right Kidney:  Renal measurements: 12.2 x 6.0 x 5.9 cm = volume: 234 mL. Echogenicity within normal limits. No mass  or hydronephrosis visualized.  Left Kidney:  Renal measurements: 9.1 x 5.2 x 4.7 cm = volume: 117 mL. Echogenicity within normal limits. No mass or hydronephrosis visualized. On color Doppler imaging there is relatively decreased color Doppler flow in the left kidney compared to the right  Bladder:  Appears normal for degree of bladder distention.  Other:  None.  IMPRESSION: 1. Relatively decreased color Doppler flow in the left kidney compared to the right. Which is nonspecific and of uncertain clinical significance, possibly technical in nature. However, this can also be seen with pyelonephritis, recommend correlation with laboratory analysis for further evaluation. These results will be called to the ordering clinician or representative by the Radiologist Assistant, and communication documented  in the PACS or [employer].  2.  Similar left renal atrophy.  3.  No hydronephrosis   Electronically Signed   By: Maudry Mayhew MD   On: 02/18/2020 15:43   I have independently reviewed the films.  See HPI.   Assessment & Plan:    1. Flank pain  -Likely secondary to pyelonephritis.  We will continue to monitor and if she should worsen clinically will obtain a contrast CT as renal ultrasounds are not preferred imaging for evaluation for abscess, but patient has had several CT scans over the last month and I wanted to avoid more radiation unless is necessary.   -Given 1 gram Rocephin IM today - Prescribed Cipro 500 mg BID x 7 days - UA suspicious for infection - urine sent for culture - CBC, BMP and Lactic Acid   2. 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  These notes generated with voice recognition software. I apologize for typographical errors.  Michiel Cowboy, PA-C  [facility] [address]  [address] [place], [place] [address] [phone]  Addendum: Patient does not feel she is improving, so we obtained a CT scan.    Contrast CT 02/20/2020 shows persistence of the renal abscess.  We will extend her Cipro 500 mg, BID x 30 days, prescribed VIcodin for pain and Phenergan 25 mg for nausea.  She will follow up with Dr. Richardo Hanks next week for further discussion regarding removal of her kidney.  She will contact us or seek treatment in the ED if she should develop fevers > 101.3 that are sustained over a few days, intractable nausea/vomiting or uncontrolled pain.

## 2020-02-19 ENCOUNTER — Telehealth: Payer: Self-pay | Admitting: Urology

## 2020-02-19 NOTE — Telephone Encounter (Signed)
Spoke with Katelyn Barnes this morning.  She states that her symptoms have not changed.  She denies any fever or chills, but the pain has not decreased or worsened.  We will continue to monitor her and call her tomorrow with an update.  I advised her that if her pain should become worse, she should have high fever or vomiting to contact her office immediately or seek treatment in the ED.

## 2020-02-20 ENCOUNTER — Telehealth: Payer: Self-pay | Admitting: Family Medicine

## 2020-02-20 ENCOUNTER — Other Ambulatory Visit: Payer: Self-pay | Admitting: Urology

## 2020-02-20 ENCOUNTER — Ambulatory Visit
Admission: RE | Admit: 2020-02-20 | Discharge: 2020-02-20 | Disposition: A | Discharge status: Home / Self Care | Payer: Medicaid Other | Source: Ambulatory Visit | Attending: Urology | Admitting: Urology

## 2020-02-20 ENCOUNTER — Other Ambulatory Visit: Payer: Self-pay

## 2020-02-20 DIAGNOSIS — R112 Nausea with vomiting, unspecified: Secondary | ICD-10-CM

## 2020-02-20 LAB — URINE CULTURE: Culture: 20000 — AB

## 2020-02-20 MED ORDER — HYDROCODONE-ACETAMINOPHEN 5-325 MG PO TABS: 1.0000 | ORAL_TABLET | Freq: Four times a day (QID) | ORAL | 0 refills | Status: DC | PRN

## 2020-02-20 MED ORDER — IOHEXOL 300 MG/ML  SOLN
100.0000 mL | Freq: Once | INTRAMUSCULAR | Status: AC | PRN
  Administered 2020-02-20: 100 mL via INTRAVENOUS

## 2020-02-20 MED ORDER — PROMETHAZINE HCL 25 MG PO TABS: 25.0000 mg | ORAL_TABLET | Freq: Four times a day (QID) | ORAL | 0 refills | Status: DC | PRN

## 2020-02-20 MED ORDER — CIPROFLOXACIN HCL 500 MG PO TABS: 500.0000 mg | ORAL_TABLET | Freq: Two times a day (BID) | ORAL | 0 refills | Status: DC

## 2020-02-20 NOTE — Telephone Encounter (Signed)
-----   Message from Harle Battiest, PA-C sent at 02/20/2020  9:01 AM EST ----- Please call Mrs. Katelyn Barnes and see if she is improving.  If she is not, we need to get a stat CT on her and we need to get another urine specimen for urinalysis and urine culture.

## 2020-02-20 NOTE — Telephone Encounter (Signed)
Patient states she had water at 9.

## 2020-02-20 NOTE — Telephone Encounter (Signed)
Patient states she is not feeling any better. She was nauseous this morning and took some medication for nausea and it is not helping.

## 2020-02-20 NOTE — Progress Notes (Signed)
Orders in for STAT CT

## 2020-02-20 NOTE — Telephone Encounter (Signed)
I'm going to order a STAT CT and it will be with contrast, so she will likely need to NPO.  Tell her not to eat or drink anything until she hears from [place] and we need to know when she last ate or drank anything.

## 2020-02-20 NOTE — Progress Notes (Signed)
I spoken to Mrs. Cockerell regarding her CT scan results.  We will make plans for her to see Dr. Richardo Hanks next week so that he may further discuss her treatment options going forward which will likely be removal of her left kidney.  In the interim, we will have her remain on Cipro 500 mg twice daily for 4 weeks, Phenergan for nausea and Vicodin for pain.  I have instructed her that if she should experience a fever above 101 to contact the office immediately or seek treatment in the emergency room for admission to the hospital for IV antibiotics and supportive care.  She voiced her understanding and appointment will be made.  Prescriptions were sent to her pharmacy in [place], [place].

## 2020-02-21 ENCOUNTER — Telehealth: Payer: Self-pay | Admitting: Urology

## 2020-02-28 ENCOUNTER — Other Ambulatory Visit: Payer: Self-pay

## 2020-02-28 ENCOUNTER — Encounter: Payer: Self-pay | Admitting: Urology

## 2020-02-28 ENCOUNTER — Ambulatory Visit (INDEPENDENT_AMBULATORY_CARE_PROVIDER_SITE_OTHER): Payer: Medicaid Other | Admitting: Urology

## 2020-02-28 VITALS — BP 108/73 | HR 85 | Temp 98.3°F | Ht 66.0 in | Wt 223.0 lb

## 2020-02-28 DIAGNOSIS — N118 Other chronic tubulo-interstitial nephritis: Secondary | ICD-10-CM

## 2020-02-28 DIAGNOSIS — N12 Tubulo-interstitial nephritis, not specified as acute or chronic: Secondary | ICD-10-CM

## 2020-02-28 LAB — MICROSCOPIC EXAMINATION

## 2020-02-28 LAB — URINALYSIS, COMPLETE
Bilirubin, UA: NEGATIVE
Glucose, UA: NEGATIVE
Ketones, UA: NEGATIVE
Nitrite, UA: NEGATIVE
Protein,UA: NEGATIVE
RBC, UA: NEGATIVE
Specific Gravity, UA: 1.02 (ref 1.005–1.030)
Urobilinogen, Ur: 0.2 mg/dL (ref 0.2–1.0)
pH, UA: 6 (ref 5.0–7.5)

## 2020-02-28 MED ORDER — CELECOXIB 200 MG PO CAPS: 200.0000 mg | ORAL_CAPSULE | Freq: Every day | ORAL | 0 refills | Status: DC

## 2020-02-28 NOTE — Progress Notes (Signed)
   02/28/2020 1:06 PM   Wilkie Aye Laiya Wisby 03-Feb-1980 [id number]  Reason for visit: Follow up recurrent left pyelonephritis, atrophic left kidney  HPI: Briefly, she is a 40 year old female that originally presented in May 2021 with left hydronephrosis, pyelonephritis, and suspected sepsis from urinary source and underwent urgent left ureteral stent placement with Dr. Mena Goes.  She had no ureteral stones or abnormalities at that time, aside from a small mucous plug that was seen in the bladder.  She underwent a follow-up diagnostic ureteroscopy with me in June 2021 after treatment for infection, which showed a normal left kidney and no evidence of stricture, stones, or other ureteral abnormalities.  She has been managed primarily by Dr. Mena Goes in her [place] Michiel Cowboy since that time.  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  She has grown out Pseudomonas and E. coli previously.  I last saw her on 01/16/2020 when she was doing pretty well without flank pain or urinary symptoms, and ultrasound had showed no hydronephrosis and some resolving heterogenous area within the midpole of the left kidney.  She return to clinic on 02/18/2020 and saw Michiel Cowboy for worsening left-sided flank pain and nausea.  Urinalysis at that time showed 20k multiple species and recollection was recommended.  A renal ultrasound showed decreased flow in the left kidney, but no hydronephrosis, and an atrophic left kidney.  This prompted a follow-up CT abdomen pelvis with contrast that showed atrophic left kidney, heterogenous enhancement of the left upper pole similar to prior CT in October 2021.  She was started on Cipro, Phenergan for nausea, and pain medication, and set up for follow-up with me.  I personally viewed and interpreted the images.  My read on her case is  that she has developed an XGP atrophic left kidney, and again I think we need to seriously consider left nephrectomy in the setting of her recurrent infections and likely decreased renal function on that side.  Today she continues to endorse some left-sided flank pain, but denies any fevers, and vitals are normal and she is afebrile in clinic today.  We reviewed options at length including another prolonged course of antibiotics, anti-inflammatories, antinausea/pain medications, consideration of repeat drain placement with interventional radiology, or possibility of left hand-assisted laparoscopic nephrectomy.  We discussed the risks of nephrectomy at length, especially the risk for CKD in the future with her diabetes.  I recommended pursuing a renal scan prior to deciding on nephrectomy.  I think they have a good understanding of her situation, and we discussed return precautions extensively.  Finally, I also offered her second opinion at [facility] or [facility] if they would like to explore other options.  NM Renal scan to evaluate left sided function, if <20% need to strongly consider nephrectomy for XGP kidney Continue Cipro 500 mg twice daily x4 weeks, repeat urinalysis and culture today to evaluate for any resistant bacteria RTC 2-3 weeks symptom check, review renal scan results  I spent 45 total minutes on the day of the encounter including pre-visit review of the medical record, face-to-face time with the patient, and post visit ordering of labs/imaging/tests.    [name], MD  [facility] [address], [address] [place], [place] [address] [phone]

## 2020-02-28 NOTE — Patient Instructions (Signed)
Continue Cipro twice daily, this is the best antibiotic for your infection, it penetrates the kidney well.  Take Celebrex daily to help with pain and inflammation for the next 2 weeks.  Okay to take nausea medications as needed, as well as pain medications.  Do not take Aleve/ibuprofen/Aleve while you are taking the Celebrex.  We will get a imaging test called a renal scan to evaluate how much function is left in the left kidney, and if this is less than 20% we need to think more seriously about removing the left kidney.   Minimally Invasive Nephrectomy Minimally invasive nephrectomy is a surgical procedure to remove a kidney. This procedure is done through several small incisions in the abdomen. You may have surgery to:  Remove the entire kidney and some surrounding structures (radical nephrectomy).  Remove only the damaged or diseased part of the kidney (partial nephrectomy). You may need this surgery if:  Your kidney is severely damaged from disease, infection, or cancer.  You were born with an abnormal kidney.  You are donating a healthy kidney. Tell a health care provider about:  Any allergies you have.  All medicines you are taking, including vitamins, herbs, eye drops, creams, and over-the-counter medicines.  Any problems you or family members have had with anesthetic medicines.  Any blood disorders you have.  Any surgeries you have had.  Any medical conditions you have.  Whether you are pregnant or may be pregnant. What are the risks? Generally, this is a safe procedure. However, problems may occur, including:  Bleeding.  Infection.  Damage to other body structures near the kidney.  Urine leaking into the abdomen.  Pneumonia.  A blood clot that forms in the leg and travels to the lung (pulmonary embolism).  Allergic reactions to medicines. A bone (usually part of a rib) or more tissue may need to be removed so the surgeon can get to the kidney. If this  happens, the minimally invasive procedure may need to be changed to an open procedure. What happens before the procedure? Staying hydrated Follow instructions from your health care provider about hydration, which may include:  Up to 2 hours before the procedure - you may continue to drink clear liquids, such as water, clear fruit juice, black coffee, and plain tea.   Eating and drinking restrictions Follow instructions from your health care provider about eating and drinking, which may include:  8 hours before the procedure - stop eating heavy meals or foods, such as meat, fried foods, or fatty foods.  6 hours before the procedure - stop eating light meals or foods, such as toast or cereal.  6 hours before the procedure - stop drinking milk or drinks that contain milk.  2 hours before the procedure - stop drinking clear liquids. Medicines Ask your health care provider about:  Changing or stopping your regular medicines. This is especially important if you are taking diabetes medicines or blood thinners.  Taking medicines such as aspirin and ibuprofen. These medicines can thin your blood. Do not take these medicines unless your health care provider tells you to take them.  Taking over-the-counter medicines, vitamins, herbs, and supplements. Surgery safety Ask your health care provider:  How your surgery site will be marked.  What steps will be taken to help prevent infection. These steps may include: ? Removing hair at the surgery site. ? Washing skin with a germ-killing soap. ? Taking antibiotic medicine. General instructions  Do not use any products that contain nicotine or  tobacco for at least 4 weeks before the procedure. These products include cigarettes, e-cigarettes, and chewing tobacco. If you need help quitting, ask your health care provider.  Your health care provider may instruct you to do breathing exercises before the procedure to help prevent pneumonia. Do these  exercises as directed.  You may need to have your blood drawn (type and cross) in case you need to receive blood (get a transfusion) during the procedure.  Plan to have a responsible adult take you home from the hospital or clinic.  Plan to have a responsible adult care for you for the time you are told after you leave the hospital or clinic. This is important. What happens during the procedure? Before surgery begins  An IV will be inserted into one of your veins.  You will be given a medicine to make you fall asleep (general anesthetic).  Your health care provider may take steps to help blood flow in your legs. You may have: ? Tight stockings, also called compression stockings, on your legs. ? Compression sleeves wrapped around your legs. A machine called a sequential compression device (SCD) will pump air into the sleeves.  A small, thin tube will be placed in your bladder (urinary catheter) to drain urine.  A tube will be placed through your nose and into your stomach (nasogastric tube) to drain stomach fluids. During surgery  A small incision will be made in your abdomen to insert a thin, lighted tube (laparoscope) with a camera attached. This lets your surgeon see your kidney during the procedure.  More small incisions will be made to insert other surgical tools. One incision may be slightly larger to remove your kidney. ? In some cases, the surgeon will do the procedure by controlling tools that are attached to a surgical robot. This is called a robot-assisted nephrectomy.  The next steps depend on the type of procedure you are having. ? If you are having a partial nephrectomy:  The blood vessels attached to your kidney will be clamped.  Part of your kidney will be removed. The kidney will be closed with stitches (sutures), and the clamp on the blood vessels will be removed. ? If you are having a radical nephrectomy:  All of the blood vessels that attach to your kidney will  be closed and cut.  Part of the tube that carries urine from your kidney to your bladder (ureter) will be removed.  Your kidney will be removed.  All of the surgical tools will be removed from your body.  A small tube (drain) may be placed near one of the incisions to drain extra fluid from the surgery area.  The incisions may be closed with sutures or another type of closure.  A bandage (dressing) will be placed over the incision area. The procedure may vary among health care providers and hospitals.      What happens after the procedure?  Your blood pressure, heart rate, breathing rate, and blood oxygen level will be monitored until you leave the hospital or clinic.  Your nasogastric tube will be removed.  You may continue to have: ? An IV until you can drink fluids on your own. ? A urinary catheter. Your urine output will be checked. ? A drain. Your surgical drain will be removed after a few days. Your health care provider will tell you how to care for the drain at home. ? Pain medicine. This will be given through your IV. ? Compression stockings or compression sleeves.This  helps to prevent blood clots and reduce swelling in your legs.  You will be encouraged to: ? Get out of bed and walk as soon as possible. ? Do breathing exercises, such as coughing and breathing deeply. This helps prevent pneumonia. Summary  Minimally invasive nephrectomy is a surgical procedure to remove a kidney. In some cases, only the damaged or diseased part of the kidney is removed.  This procedure is done through several small incisions in the abdomen.  Follow instructions from your health care provider about taking medicines and about eating and drinking before the procedure.  You will be given a medicine to make you fall asleep (general anesthetic) during the procedure. This information is not intended to replace advice given to you by your health care provider. Make sure you discuss any  questions you have with your health care provider. Document Revised: 04/23/2019 Document Reviewed: 04/23/2019 Elsevier Patient Education  2021 [employer].

## 2020-03-04 ENCOUNTER — Telehealth: Payer: Self-pay | Admitting: *Deleted

## 2020-03-04 NOTE — Telephone Encounter (Signed)
Patient called in this morning and wanted [place] to know that she is not having a fever but some chills . Patient check her blood sugar it was 110.

## 2020-03-05 LAB — CULTURE, URINE COMPREHENSIVE

## 2020-03-06 ENCOUNTER — Telehealth: Payer: Self-pay

## 2020-03-06 DIAGNOSIS — R8271 Bacteriuria: Secondary | ICD-10-CM

## 2020-03-06 MED ORDER — SULFAMETHOXAZOLE-TRIMETHOPRIM 800-160 MG PO TABS: 1.0000 | ORAL_TABLET | Freq: Two times a day (BID) | ORAL | 0 refills | Status: DC

## 2020-03-06 NOTE — Telephone Encounter (Signed)
Called pt informed her of the information below. Pt gave verbal understanding. Rx sent.

## 2020-03-06 NOTE — Telephone Encounter (Signed)
-----   Message from Brian C Sninsky, MD sent at 03/06/2020  7:34 AM EST ----- Her urine grew out a small amount of a different bacteria, and I think it is worth adding Bactrim DS BID x 2 weeks, she should take this in addition to the cipro, thanks.   Brian Sninsky, MD 03/06/2020   

## 2020-03-06 NOTE — Telephone Encounter (Signed)
-----   Message from [name], MD sent at 03/06/2020  7:34 AM EST ----- Her urine grew out a small amount of a different bacteria, and I think it is worth adding Bactrim DS BID x 2 weeks, she should take this in addition to the cipro, thanks.   Legrand Rams, MD 03/06/2020

## 2020-03-10 ENCOUNTER — Ambulatory Visit: Payer: Medicaid Other

## 2020-03-11 ENCOUNTER — Other Ambulatory Visit: Payer: Self-pay

## 2020-03-11 ENCOUNTER — Ambulatory Visit
Admission: RE | Admit: 2020-03-11 | Discharge: 2020-03-11 | Disposition: A | Discharge status: Home / Self Care | Payer: Medicaid Other | Source: Ambulatory Visit | Attending: Urology | Admitting: Urology

## 2020-03-11 DIAGNOSIS — N12 Tubulo-interstitial nephritis, not specified as acute or chronic: Secondary | ICD-10-CM | POA: Diagnosis present

## 2020-03-11 MED ORDER — FUROSEMIDE 10 MG/ML IJ SOLN
50.6000 mg | Freq: Once | INTRAMUSCULAR | Status: AC
  Administered 2020-03-11: 50.6 mg via INTRAVENOUS
  Filled 2020-03-11: qty 5.1

## 2020-03-11 MED ORDER — TECHNETIUM TC 99M MERTIATIDE
5.3400 | Freq: Once | INTRAVENOUS | Status: AC | PRN
  Administered 2020-03-11: 5.34 via INTRAVENOUS

## 2020-03-13 ENCOUNTER — Encounter: Payer: Self-pay | Admitting: Urology

## 2020-03-13 ENCOUNTER — Other Ambulatory Visit: Payer: Self-pay

## 2020-03-13 ENCOUNTER — Ambulatory Visit (INDEPENDENT_AMBULATORY_CARE_PROVIDER_SITE_OTHER): Payer: Medicaid Other | Admitting: Urology

## 2020-03-13 VITALS — BP 112/72 | HR 87 | Ht 66.0 in | Wt 221.0 lb

## 2020-03-13 DIAGNOSIS — N118 Other chronic tubulo-interstitial nephritis: Secondary | ICD-10-CM

## 2020-03-13 MED ORDER — CIPROFLOXACIN HCL 500 MG PO TABS: 500.0000 mg | ORAL_TABLET | Freq: Two times a day (BID) | ORAL | 0 refills | Status: DC

## 2020-03-13 NOTE — Progress Notes (Signed)
   03/13/2020 6:17 PM   Katelyn Barnes [name] 1980/08/18 [id number]  Reason for visit: Follow up recurrent left pyelonephritis, XGP/atrophic left kidney  HPI: Briefly, she is a 40 year old female that originally presented in May 2021 with left hydronephrosis, pyelonephritis, and suspected sepsis from urinary source and underwent urgent left ureteral stent placement with Dr. Junious Silk.  She had no ureteral stones or abnormalities at that time, aside from a small mucous plug that was seen in the bladder.  She underwent a follow-up diagnostic ureteroscopy with me in June 2021 after treatment for infection, which showed a normal left kidney and no evidence of stricture, stones, or other ureteral abnormalities.  She has been managed primarily by Dr. Junious Silk in our [place] since that time.  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  She has grown out Pseudomonas and E. coli previously.  She returned to clinic on 02/18/2020 and saw Zara Council for worsening left-sided flank pain and nausea.  Urinalysis at that time showed 20k multiple species and recollection was recommended.  A renal ultrasound showed decreased flow in the left kidney, but no hydronephrosis, and an atrophic left kidney.  This prompted a follow-up CT abdomen pelvis with contrast that showed atrophic left kidney, heterogenous enhancement of the left upper pole similar to prior CT in October 2021.  She was started on Cipro, Phenergan for nausea, and pain medication.  At our last visit, we added Bactrim based on recent culture, as well as NSAIDs for her flank pain.  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  I personally reviewed and interpreted  her NM renal scan on 03/11/2020 that shows only 10% function of the left kidney.  Today, she does feel better after the addition of Bactrim and NSAIDs, and flank pain is most completely resolved.  Unfortunately, I think she is a very high risk to develop recurrent pyelo-, abscess, and infections, and in the setting of minimally functioning kidney, I recommended pursuing left hand-assisted laparoscopic nephrectomy.  We discussed the risks and benefits including bleeding, infection, prolonged hospitalization, postoperative pain.  She understands these risks and would like to proceed.  Her renal function is normal with creatinine of 1, EGFR greater than 60.  Will work to schedule left laparoscopic nephrectomy in the next few weeks   [place], [place] [address], [place], [place] [address] [phone]

## 2020-03-13 NOTE — Patient Instructions (Signed)
Minimally Invasive Nephrectomy Minimally invasive nephrectomy is a surgical procedure to remove a kidney. This procedure is done through several small incisions in the abdomen. You may have surgery to:  Remove the entire kidney and some surrounding structures (radical nephrectomy).  Remove only the damaged or diseased part of the kidney (partial nephrectomy). You may need this surgery if:  Your kidney is severely damaged from disease, infection, or cancer.  You were born with an abnormal kidney.  You are donating a healthy kidney. Tell a health care provider about:  Any allergies you have.  All medicines you are taking, including vitamins, herbs, eye drops, creams, and over-the-counter medicines.  Any problems you or family members have had with anesthetic medicines.  Any blood disorders you have.  Any surgeries you have had.  Any medical conditions you have.  Whether you are pregnant or may be pregnant. What are the risks? Generally, this is a safe procedure. However, problems may occur, including:  Bleeding.  Infection.  Damage to other body structures near the kidney.  Urine leaking into the abdomen.  Pneumonia.  A blood clot that forms in the leg and travels to the lung (pulmonary embolism).  Allergic reactions to medicines. A bone (usually part of a rib) or more tissue may need to be removed so the surgeon can get to the kidney. If this happens, the minimally invasive procedure may need to be changed to an open procedure. What happens before the procedure? Staying hydrated Follow instructions from your health care provider about hydration, which may include:  Up to 2 hours before the procedure - you may continue to drink clear liquids, such as water, clear fruit juice, black coffee, and plain tea.   Eating and drinking restrictions Follow instructions from your health care provider about eating and drinking, which may include:  8 hours before the procedure -  stop eating heavy meals or foods, such as meat, fried foods, or fatty foods.  6 hours before the procedure - stop eating light meals or foods, such as toast or cereal.  6 hours before the procedure - stop drinking milk or drinks that contain milk.  2 hours before the procedure - stop drinking clear liquids. Medicines Ask your health care provider about:  Changing or stopping your regular medicines. This is especially important if you are taking diabetes medicines or blood thinners.  Taking medicines such as aspirin and ibuprofen. These medicines can thin your blood. Do not take these medicines unless your health care provider tells you to take them.  Taking over-the-counter medicines, vitamins, herbs, and supplements. Surgery safety Ask your health care provider:  How your surgery site will be marked.  What steps will be taken to help prevent infection. These steps may include: ? Removing hair at the surgery site. ? Washing skin with a germ-killing soap. ? Taking antibiotic medicine. General instructions  Do not use any products that contain nicotine or tobacco for at least 4 weeks before the procedure. These products include cigarettes, e-cigarettes, and chewing tobacco. If you need help quitting, ask your health care provider.  Your health care provider may instruct you to do breathing exercises before the procedure to help prevent pneumonia. Do these exercises as directed.  You may need to have your blood drawn (type and cross) in case you need to receive blood (get a transfusion) during the procedure.  Plan to have a responsible adult take you home from the hospital or clinic.  Plan to have a responsible adult  care for you for the time you are told after you leave the hospital or clinic. This is important. What happens during the procedure? Before surgery begins  An IV will be inserted into one of your veins.  You will be given a medicine to make you fall asleep (general  anesthetic).  Your health care provider may take steps to help blood flow in your legs. You may have: ? Tight stockings, also called compression stockings, on your legs. ? Compression sleeves wrapped around your legs. A machine called a sequential compression device (SCD) will pump air into the sleeves.  A small, thin tube will be placed in your bladder (urinary catheter) to drain urine.  A tube will be placed through your nose and into your stomach (nasogastric tube) to drain stomach fluids. During surgery  A small incision will be made in your abdomen to insert a thin, lighted tube (laparoscope) with a camera attached. This lets your surgeon see your kidney during the procedure.  More small incisions will be made to insert other surgical tools. One incision may be slightly larger to remove your kidney. ? In some cases, the surgeon will do the procedure by controlling tools that are attached to a surgical robot. This is called a robot-assisted nephrectomy.  The next steps depend on the type of procedure you are having. ? If you are having a partial nephrectomy:  The blood vessels attached to your kidney will be clamped.  Part of your kidney will be removed. The kidney will be closed with stitches (sutures), and the clamp on the blood vessels will be removed. ? If you are having a radical nephrectomy:  All of the blood vessels that attach to your kidney will be closed and cut.  Part of the tube that carries urine from your kidney to your bladder (ureter) will be removed.  Your kidney will be removed.  All of the surgical tools will be removed from your body.  A small tube (drain) may be placed near one of the incisions to drain extra fluid from the surgery area.  The incisions may be closed with sutures or another type of closure.  A bandage (dressing) will be placed over the incision area. The procedure may vary among health care providers and hospitals.      What happens  after the procedure?  Your blood pressure, heart rate, breathing rate, and blood oxygen level will be monitored until you leave the hospital or clinic.  Your nasogastric tube will be removed.  You may continue to have: ? An IV until you can drink fluids on your own. ? A urinary catheter. Your urine output will be checked. ? A drain. Your surgical drain will be removed after a few days. Your health care provider will tell you how to care for the drain at home. ? Pain medicine. This will be given through your IV. ? Compression stockings or compression sleeves.This helps to prevent blood clots and reduce swelling in your legs.  You will be encouraged to: ? Get out of bed and walk as soon as possible. ? Do breathing exercises, such as coughing and breathing deeply. This helps prevent pneumonia. Summary  Minimally invasive nephrectomy is a surgical procedure to remove a kidney. In some cases, only the damaged or diseased part of the kidney is removed.  This procedure is done through several small incisions in the abdomen.  Follow instructions from your health care provider about taking medicines and about eating and drinking before  the procedure.  You will be given a medicine to make you fall asleep (general anesthetic) during the procedure. This information is not intended to replace advice given to you by your health care provider. Make sure you discuss any questions you have with your health care provider. Document Revised: 04/23/2019 Document Reviewed: 04/23/2019 Elsevier Patient Education  2021 [employer]   Minimally Invasive Nephrectomy, Care After This sheet gives you information about how to care for yourself after your procedure. Your health care provider may also give you more specific instructions. If you have problems or questions, contact your health care provider. What can I expect after the procedure? After the procedure, it is common to have:  Pain.  Soreness and  numbness in the area around your incisions. Follow these instructions at home: Medicines  Take over-the-counter and prescription medicines only as told by your health care provider.  Avoid using NSAIDs regularly over a long period of time. These include aspirin and ibuprofen. Doing so may damage your remaining kidney.  Ask your health care provider if the medicine prescribed to you: ? Requires you to avoid driving or using machinery. ? Can cause constipation. You may need to take these actions to prevent or treat constipation:  Drink enough fluid to keep your urine pale yellow.  Take over-the-counter or prescription medicines.  Eat foods that are high in fiber, such as beans, whole grains, and fresh fruits and vegetables.  Limit foods that are high in fat and processed sugars, such as fried or sweet foods.   Incision care and drain care  Follow instructions from your health care provider about how to take care of your incisions. Make sure you: ? Wash your hands with soap and water for at least 20 seconds before and after you change your bandage (dressing). If soap and water are not available, use hand sanitizer. ? Change your dressing as told by your health care provider. ? Leave stitches (sutures), skin glue, or adhesive strips in place. These skin closures may need to be in place for 2 weeks or longer. If adhesive strip edges start to loosen and curl up, you may trim the loose edges. Do not remove adhesive strips completely unless your health care provider tells you to do that.  Check your incision area every day for signs of infection. Check for: ? Redness, swelling, or more pain. ? Fluid or blood. ? Warmth. ? Pus or a bad smell.  If you have a drain in place, follow your health care provider's instructions for drain care. This may include emptying the drain and keeping track of the amount of drainage.   Activity  Rest as told by your health care provider.  Avoid sitting for a  long time without moving. Get up to take short walks every 1-2 hours. This is important to improve blood flow and breathing. Ask for help if you feel weak or unsteady.  Do not lift anything that is heavier than 10 lb (4.5 kg), or the limit that you are told, until your health care provider says that it is safe.  Do not play contact sports. Doing so may damage your remaining kidney.  Return to your normal activities as told by your health care provider. Ask your health care provider what activities are safe for you.   Catheter care If you have a urinary catheter, care for it as told by your health care provider. You may be told to:  Wash your hands with soap and water for  at least 20 seconds before and after touching the catheter, tubing, or drainage bag. If soap and water are not available, use hand sanitizer.  Empty the drainage bag every 2-4 hours, or more often if needed. Do not let the bag get completely full.  Monitor the amount and color of your urine as told by your health care provider.  Keep the area around the catheter clean and dry.  Make sure to keep the catheter secured to avoid pulling and accidental removal.  Always keep the urine collection bag below the level of your bladder.  Check the catheter tubing regularly to make sure there are no kinks or blockages.  Make sure that the catheter is not placed under water. General instructions  Do not take baths, swim, or use a hot tub until your health care provider approves. Ask your health care provider if you may take showers. You may only be allowed to take sponge baths.  Do deep breathing exercises as told by your health care provider. These help to prevent lung infection (pneumonia).  Wear compression stockings as told by your health care provider. These stockings help to prevent blood clots and reduce swelling in your legs.  Keep all follow-up visits. This is important. Contact a health care provider if:  You have a  fever or chills.  You have pain that is not controlled by your pain medicine.  You have redness, swelling, or more pain at an incision site.  You have a cough.  An incision is warm to the touch.  You have blood in your urine.  You have not had a bowel movement in 3 days.  You have diarrhea. Get help right away if:  You have trouble breathing or feel short of breath.  You have chest pain.  You have severe pain.  There is fluid or blood coming from an incision.  There is pus or a bad smell coming from an incision.  You cannot urinate.  You have warmth, redness, and tenderness in your leg. These symptoms may represent a serious problem that is an emergency. Do not wait to see if the symptoms will go away. Get medical help right away. Call your local emergency services (911 in the [place] Do not drive yourself to the hospital. Summary  Follow instructions from your health care provider about how to take care of your incisions. Check your incision area every day for signs of infection.  Take over-the-counter and prescription medicines only as told by your health care provider.  Return to your normal activities as told by your health care provider. Ask your health care provider what activities are safe for you.  Keep all follow-up visits. This is important. This information is not intended to replace advice given to you by your health care provider. Make sure you discuss any questions you have with your health care provider. Document Revised: 04/23/2019 Document Reviewed: 04/23/2019 Elsevier Patient Education  2021 [employer].

## 2020-03-19 ENCOUNTER — Other Ambulatory Visit: Payer: Self-pay | Admitting: Urology

## 2020-03-19 DIAGNOSIS — N118 Other chronic tubulo-interstitial nephritis: Secondary | ICD-10-CM

## 2020-03-24 ENCOUNTER — Telehealth: Payer: Self-pay | Admitting: Urology

## 2020-03-24 DIAGNOSIS — N151 Renal and perinephric abscess: Secondary | ICD-10-CM

## 2020-03-24 MED ORDER — CIPROFLOXACIN HCL 500 MG PO TABS: 500.0000 mg | ORAL_TABLET | Freq: Two times a day (BID) | ORAL | 0 refills | Status: DC

## 2020-03-24 NOTE — Telephone Encounter (Signed)
RX sent

## 2020-03-24 NOTE — Telephone Encounter (Signed)
This afternoon Pt. Katelyn Barnes stating that her antibiotic will run our tomorrow and she is scheduled for surgery on 04/04/20.   Pt. Also stated that she though that Dr. Richardo Hanks wanted her to stay on the antibiotic until her surgery and would appreciate a call to confirm this. @ [phone]

## 2020-03-24 NOTE — Telephone Encounter (Signed)
Yes, continue cipro 500mg  BID x 14 days, this will take her through surgery, thanks  , MD 03/24/2020

## 2020-03-25 ENCOUNTER — Encounter
Admission: RE | Admit: 2020-03-25 | Discharge: 2020-03-25 | Disposition: A | Discharge status: Home / Self Care | Payer: Medicaid Other | Source: Ambulatory Visit | Attending: Urology | Admitting: Urology

## 2020-03-25 ENCOUNTER — Other Ambulatory Visit: Payer: Self-pay

## 2020-03-25 DIAGNOSIS — Z01818 Encounter for other preprocedural examination: Secondary | ICD-10-CM | POA: Diagnosis present

## 2020-03-25 HISTORY — DX: Anemia, unspecified: D64.9

## 2020-03-25 HISTORY — DX: COVID-19: U07.1

## 2020-03-25 NOTE — Patient Instructions (Addendum)
Your procedure is scheduled on: 04/04/20- Friday Report to the Registration Desk on the 1st floor of [facility]. To find out your arrival time, please call [phone] between 1PM - 3PM on: 04/03/20 - Thursday Report to [facility] for labs and Covid testing on 04/02/20 at 10 am, you will park and walk in.  REMEMBER: Instructions that are not followed completely may result in serious medical risk, up to and including death; or upon the discretion of your surgeon and anesthesiologist your surgery may need to be rescheduled.  Do not eat food or drink any fluids after midnight the night before surgery.  No gum chewing, lozengers or hard candies.  TAKE THESE MEDICATIONS THE MORNING OF SURGERY WITH A SIP OF WATER: - topiramate (TOPAMAX) 25 MG tablet. - atorvastatin (LIPITOR) 40 MG tablet - ciprofloxacin (CIPRO) 500 MG tablet  Stop Metformin  2 days prior to surgery. Do not take 03/23, 03/24 or the morning of surgery.  Take NO insulin dose on the morning of surgery.  One week prior to surgery: Stop Anti-inflammatories (NSAIDS) such as Advil, Aleve, Ibuprofen, Motrin, Naproxen, Naprosyn and Aspirin based products such as Excedrin, Goodys Powder, BC Powder.  Stop ANY OVER THE COUNTER supplements until after surgery.  No Alcohol for 24 hours before or after surgery.  No Smoking including e-cigarettes for 24 hours prior to surgery.  No chewable tobacco products for at least 6 hours prior to surgery.  No nicotine patches on the day of surgery.  Do not use any "recreational" drugs for at least a week prior to your surgery.  Please be advised that the combination of cocaine and anesthesia may have negative outcomes, up to and including death. If you test positive for cocaine, your surgery will be cancelled.  On the morning of surgery brush your teeth with toothpaste and water, you may rinse your mouth with mouthwash if you wish. Do not swallow any toothpaste or mouthwash.  Do not  wear jewelry, make-up, hairpins, clips or nail polish.  Do not wear lotions, powders, or perfumes.   Do not shave body from the neck down 48 hours prior to surgery just in case you cut yourself which could leave a site for infection.  Also, freshly shaved skin may become irritated if using the CHG soap.  Contact lenses, hearing aids and dentures may not be worn into surgery.  Do not bring valuables to the hospital. [facility] is not responsible for any missing/lost belongings or valuables.   Use CHG Soap or wipes as directed on instruction sheet.  Notify your doctor if there is any change in your medical condition (cold, fever, infection).  Wear comfortable clothing (specific to your surgery type) to the hospital.  Plan for stool softeners for home use; pain medications have a tendency to cause constipation. You can also help prevent constipation by eating foods high in fiber such as fruits and vegetables and drinking plenty of fluids as your diet allows.  After surgery, you can help prevent lung complications by doing breathing exercises.  Take deep breaths and cough every 1-2 hours. Your doctor may order a device called an Incentive Spirometer to help you take deep breaths. When coughing or sneezing, hold a pillow firmly against your incision with both hands. This is called "splinting." Doing this helps protect your incision. It also decreases belly discomfort.  If you are being admitted to the hospital overnight, leave your suitcase in the car. After surgery it may be brought to your  room.  If you are being discharged the day of surgery, you will not be allowed to drive home. You will need a responsible adult (18 years or older) to drive you home and stay with you that night.   If you are taking public transportation, you will need to have a responsible adult (18 years or older) with you. Please confirm with your physician that it is acceptable to use public transportation.    Please call the Pre-admissions Testing Dept. at [phone] if you have any questions about these instructions.  Surgery Visitation Policy:  Patients undergoing a surgery or procedure may have one family member or support person with them as long as that person is not COVID-19 positive or experiencing its symptoms.  That person may remain in the waiting area during the procedure.  Inpatient Visitation:    Visiting hours are 7 a.m. to 8 p.m. Inpatients will be allowed two visitors daily. The visitors may change each day during the patient's stay. No visitors under the age of 33. Any visitor under the age of 47 must be accompanied by an adult. The visitor must pass COVID-19 screenings, use hand sanitizer when entering and exiting the patient's room and wear a mask at all times, including in the patient's room. Patients must also wear a mask when staff or their visitor are in the room. Masking is required regardless of vaccination status.

## 2020-04-02 ENCOUNTER — Other Ambulatory Visit: Payer: Self-pay

## 2020-04-02 ENCOUNTER — Other Ambulatory Visit
Admission: RE | Admit: 2020-04-02 | Discharge: 2020-04-02 | Disposition: A | Discharge status: Home / Self Care | Payer: Medicaid Other | Source: Ambulatory Visit | Attending: Urology | Admitting: Urology

## 2020-04-02 DIAGNOSIS — Z01812 Encounter for preprocedural laboratory examination: Secondary | ICD-10-CM | POA: Diagnosis present

## 2020-04-02 DIAGNOSIS — Z20822 Contact with and (suspected) exposure to covid-19: Secondary | ICD-10-CM | POA: Diagnosis not present

## 2020-04-02 LAB — CBC
HCT: 34.3 % — ABNORMAL LOW (ref 36.0–46.0)
Hemoglobin: 11.1 g/dL — ABNORMAL LOW (ref 12.0–15.0)
MCH: 30.3 pg (ref 26.0–34.0)
MCHC: 32.4 g/dL (ref 30.0–36.0)
MCV: 93.7 fL (ref 80.0–100.0)
Platelets: 150 10*3/uL (ref 150–400)
RBC: 3.66 MIL/uL — ABNORMAL LOW (ref 3.87–5.11)
RDW: 13.6 % (ref 11.5–15.5)
WBC: 5.7 10*3/uL (ref 4.0–10.5)
nRBC: 0 % (ref 0.0–0.2)

## 2020-04-02 LAB — TYPE AND SCREEN
ABO/RH(D): A POS
Antibody Screen: NEGATIVE

## 2020-04-02 LAB — COMPREHENSIVE METABOLIC PANEL
ALT: 31 U/L (ref 0–44)
AST: 33 U/L (ref 15–41)
Albumin: 4.3 g/dL (ref 3.5–5.0)
Alkaline Phosphatase: 33 U/L — ABNORMAL LOW (ref 38–126)
Anion gap: 6 (ref 5–15)
BUN: 22 mg/dL — ABNORMAL HIGH (ref 6–20)
CO2: 19 mmol/L — ABNORMAL LOW (ref 22–32)
Calcium: 9.1 mg/dL (ref 8.9–10.3)
Chloride: 112 mmol/L — ABNORMAL HIGH (ref 98–111)
Creatinine, Ser: 0.96 mg/dL (ref 0.44–1.00)
GFR, Estimated: >60 mL/min (ref >60)
Glucose, Bld: 78 mg/dL (ref 70–99)
Potassium: 4 mmol/L (ref 3.5–5.1)
Sodium: 137 mmol/L (ref 135–145)
Total Bilirubin: 1.3 mg/dL — ABNORMAL HIGH (ref 0.3–1.2)
Total Protein: 7.8 g/dL (ref 6.5–8.1)

## 2020-04-02 LAB — SARS CORONAVIRUS 2 (TAT 6-24 HRS): SARS Coronavirus 2: NEGATIVE

## 2020-04-02 NOTE — Progress Notes (Signed)
Perioperative Services Pre-Admission/Anesthesia Testing   Date: 04/02/20 Name: Katelyn Barnes MRN:   [id number]  Re: Consideration of preoperative prophylactic antibiotic change   Request sent to: [name], MD (routed and/or faxed via [facility])  Planned Surgical Procedure(s):   Notes: 1. 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 Advising that PCN has caused her to experience low severity rash in the past (age 40)  2. Received cephalosporin with no documented complications . CEFAZOLIN received on 07/12/2014 . CEFIPIME received on 06/11/2019, 06/12/2019, and 09/05/2019 . CEFTRIAXONE received on 09/07/2019  3. Screened as appropriate for cephalosporin use during medication reconciliation . No immediate angioedema, dysphagia, SOB, anaphylaxis symptoms. . No severe rash involving mucous membranes or skin necrosis. . No hospital admissions related to side effects of PCN/cephalosporin use.  . No documented reaction to PCN or cephalosporin in the last 10 years.  Request:  As an evidence based approach to reducing the rate of incidence for post-operative SSI and the development of MDROs, could an agent with narrower coverage for preoperative prophylaxis in this patient's upcoming surgical course be considered?   1. Currently ordered preoperative prophylactic ABX: ciprofloxacin.   2. Specifically requesting change to cephalosporin (CEFAZOLIN).   3. Please communicate decision with me and I will change the orders in Epic as per your direction.   Things to consider:  Many patients report that they were "allergic" to PCN earlier in life, however this does not translate into a true lifelong allergy. Patients can lose sensitivity to specific IgE antibodies over time if PCN is avoided (Kleris & Lugar, 2019).   Up to 10% of the adult population and 15% of hospitalized patients report an allergy to PCN, however clinical studies suggest that 90% of those reporting an allergy can  tolerate PCN antibiotics (Kleris & Lugar, 2019).   Cross-sensitivity between PCN and cephalosporins has been documented as being as high as 10%, however this estimation included data believed to have been collected in a setting where there was contamination. Newer data suggests that the prevalence of cross-sensitivity between PCN and cephalosporins is actually estimated to be closer to 1% (Hermanides et al., 2018).    Patients labeled as PCN allergic, whether they are truly allergic or not, have been found to have inferior outcomes in terms of rates of serious infection, and these patients tend to have longer hospital stays [facility] & Lugar, 2019).   Treatment related secondary infections, such as Clostridioides difficile, have been linked to the improper use of broad spectrum antibiotics in patients improperly labeled as PCN allergic (Kleris & Lugar, 2019).      Anaphylaxis from cephalosporins is rare and the evidence suggests that there is no increased risk of an anaphylactic type reaction when cephalosporins are used in a PCN allergic patient (Pichichero, 2006).   Citations: Hermanides J, Lemkes BA, Prins Gwenyth Bender MW, Terreehorst I. Presumed ?-Lactam Allergy and Cross-reactivity in the Operating Theater: A Practical Approach. Anesthesiology. 2018 Aug;129(2):335-342. doi: 10.1097/ALN.0000000000002252. PMID: 43329518.  Kleris, R. S., & Lugar, P. L. (2019). Things We Do For No Reason: Failing to Question a Penicillin Allergy History. Journal of hospital medicine, 14(10), [phone]. Advance online publication. airportbarriers.com  Dinah Beers, Bud Face et al: Best practice statement on urologic procedures and antimicrobial prophylaxis. J Urol 2020; 203: 351.  Pichichero, M. E. (2006). Cephalosporins can be prescribed safely for penicillin-allergic patients. Journal of family medicine, 55(2), 106-112. Accessed:  https://cdn.mdedge.com/files/s82fs-public/Document/[email or web address]/[email or web address].pdf   Quentin Mulling, MSN, APRN, FNP-C, CEN [place]   [facility] Nurse Practitioner FAX: [phone] 04/02/20 11:42 AM

## 2020-04-02 NOTE — Telephone Encounter (Signed)
Error

## 2020-04-04 ENCOUNTER — Ambulatory Visit: Payer: Medicaid Other | Admitting: Anesthesiology

## 2020-04-04 ENCOUNTER — Other Ambulatory Visit: Payer: Self-pay

## 2020-04-04 ENCOUNTER — Encounter: Payer: Self-pay | Admitting: Urology

## 2020-04-04 ENCOUNTER — Encounter
Admission: RE | Disposition: A | Discharge status: Home / Self Care | Payer: Self-pay | Source: Home / Self Care | Attending: Urology

## 2020-04-04 ENCOUNTER — Inpatient Hospital Stay
Admission: RE | Admit: 2020-04-04 | Discharge: 2020-04-07 | DRG: 661 | Disposition: A | Discharge status: Home / Self Care | Payer: Medicaid Other | Attending: Urology | Admitting: Urology

## 2020-04-04 DIAGNOSIS — N118 Other chronic tubulo-interstitial nephritis: Secondary | ICD-10-CM

## 2020-04-04 DIAGNOSIS — Z981 Arthrodesis status: Secondary | ICD-10-CM

## 2020-04-04 DIAGNOSIS — Z881 Allergy status to other antibiotic agents status: Secondary | ICD-10-CM

## 2020-04-04 DIAGNOSIS — N261 Atrophy of kidney (terminal): Secondary | ICD-10-CM | POA: Diagnosis present

## 2020-04-04 DIAGNOSIS — Z880 Allergy status to penicillin: Secondary | ICD-10-CM

## 2020-04-04 DIAGNOSIS — N10 Acute tubulo-interstitial nephritis: Principal | ICD-10-CM | POA: Diagnosis present

## 2020-04-04 DIAGNOSIS — Z8616 Personal history of COVID-19: Secondary | ICD-10-CM

## 2020-04-04 DIAGNOSIS — R310 Gross hematuria: Secondary | ICD-10-CM | POA: Diagnosis not present

## 2020-04-04 DIAGNOSIS — Z87442 Personal history of urinary calculi: Secondary | ICD-10-CM

## 2020-04-04 DIAGNOSIS — R110 Nausea: Secondary | ICD-10-CM | POA: Diagnosis present

## 2020-04-04 DIAGNOSIS — E119 Type 2 diabetes mellitus without complications: Secondary | ICD-10-CM | POA: Diagnosis present

## 2020-04-04 HISTORY — PX: LAPAROSCOPIC NEPHRECTOMY, HAND ASSISTED: SHX1929

## 2020-04-04 LAB — POCT I-STAT, CHEM 8
BUN: 24 mg/dL — ABNORMAL HIGH (ref 6–20)
Calcium, Ion: 1.28 mmol/L (ref 1.15–1.40)
Chloride: 110 mmol/L (ref 98–111)
Creatinine, Ser: 1 mg/dL (ref 0.44–1.00)
Glucose, Bld: 118 mg/dL — ABNORMAL HIGH (ref 70–99)
HCT: 31 % — ABNORMAL LOW (ref 36.0–46.0)
Hemoglobin: 10.5 g/dL — ABNORMAL LOW (ref 12.0–15.0)
Potassium: 3.8 mmol/L (ref 3.5–5.1)
Sodium: 142 mmol/L (ref 135–145)
TCO2: 19 mmol/L — ABNORMAL LOW (ref 22–32)

## 2020-04-04 LAB — GLUCOSE, CAPILLARY
Glucose-Capillary: 194 mg/dL — ABNORMAL HIGH (ref 70–99)
Glucose-Capillary: 157 mg/dL — ABNORMAL HIGH (ref 70–99)
Glucose-Capillary: 182 mg/dL — ABNORMAL HIGH (ref 70–99)

## 2020-04-04 LAB — POCT PREGNANCY, URINE: Preg Test, Ur: NEGATIVE

## 2020-04-04 SURGERY — NEPHRECTOMY, HAND-ASSISTED, LAPAROSCOPIC
Anesthesia: General | Site: Abdomen | Laterality: Left

## 2020-04-04 MED ORDER — HYDROMORPHONE HCL 1 MG/ML IJ SOLN
0.5000 mg | INTRAMUSCULAR | Status: DC | PRN
Start: 2020-04-04 — End: 2020-04-07
  Administered 2020-04-04 – 2020-04-07 (×7): 1 mg via INTRAVENOUS
  Filled 2020-04-04 (×7): qty 1

## 2020-04-04 MED ORDER — THROMBIN 5000 UNITS EX SOLR
CUTANEOUS | Status: DC | PRN
  Administered 2020-04-04: 5000 [IU] via TOPICAL

## 2020-04-04 MED ORDER — PROPOFOL 10 MG/ML IV BOLUS
INTRAVENOUS | Status: DC | PRN
  Administered 2020-04-04: 180 mg via INTRAVENOUS
  Administered 2020-04-04: 20 mg via INTRAVENOUS

## 2020-04-04 MED ORDER — PROPOFOL 500 MG/50ML IV EMUL
INTRAVENOUS | Status: AC
  Filled 2020-04-04: qty 50

## 2020-04-04 MED ORDER — FENTANYL CITRATE (PF) 100 MCG/2ML IJ SOLN
INTRAMUSCULAR | Status: AC
  Filled 2020-04-04: qty 2

## 2020-04-04 MED ORDER — LOSARTAN POTASSIUM 50 MG PO TABS
50.0000 mg | ORAL_TABLET | Freq: Every day | ORAL | Status: DC
  Administered 2020-04-04 – 2020-04-06 (×2): 50 mg via ORAL
  Filled 2020-04-04 (×3): qty 1

## 2020-04-04 MED ORDER — BUPIVACAINE LIPOSOME 1.3 % IJ SUSP
INTRAMUSCULAR | Status: DC | PRN
  Administered 2020-04-04: 60 mL

## 2020-04-04 MED ORDER — ONDANSETRON HCL 4 MG/2ML IJ SOLN
4.0000 mg | INTRAMUSCULAR | Status: DC | PRN
  Administered 2020-04-04 – 2020-04-06 (×4): 4 mg via INTRAVENOUS
  Filled 2020-04-04 (×4): qty 2

## 2020-04-04 MED ORDER — DEXMEDETOMIDINE (PRECEDEX) IN NS 20 MCG/5ML (4 MCG/ML) IV SYRINGE
PREFILLED_SYRINGE | INTRAVENOUS | Status: DC | PRN
  Administered 2020-04-04: 12 ug via INTRAVENOUS
  Administered 2020-04-04: 4 ug via INTRAVENOUS

## 2020-04-04 MED ORDER — BUPIVACAINE HCL (PF) 0.5 % IJ SOLN
INTRAMUSCULAR | Status: AC
  Filled 2020-04-04: qty 30

## 2020-04-04 MED ORDER — CIPROFLOXACIN IN D5W 400 MG/200ML IV SOLN
INTRAVENOUS | Status: AC
  Filled 2020-04-04: qty 200

## 2020-04-04 MED ORDER — ADULT MULTIVITAMIN W/MINERALS CH
1.0000 | ORAL_TABLET | Freq: Every day | ORAL | Status: DC
  Administered 2020-04-04 – 2020-04-07 (×3): 1 via ORAL
  Filled 2020-04-04 (×4): qty 1

## 2020-04-04 MED ORDER — HEPARIN SODIUM (PORCINE) 5000 UNIT/ML IJ SOLN
5000.0000 [IU] | Freq: Three times a day (TID) | INTRAMUSCULAR | Status: DC
  Administered 2020-04-04 – 2020-04-07 (×9): 5000 [IU] via SUBCUTANEOUS
  Filled 2020-04-04 (×9): qty 1

## 2020-04-04 MED ORDER — OXYCODONE HCL 5 MG/5ML PO SOLN: 5.0000 mg | Freq: Once | ORAL | Status: DC | PRN

## 2020-04-04 MED ORDER — PHENYLEPHRINE HCL (PRESSORS) 10 MG/ML IV SOLN
INTRAVENOUS | Status: DC | PRN
  Administered 2020-04-04: 200 ug via INTRAVENOUS
  Administered 2020-04-04 (×2): 100 ug via INTRAVENOUS

## 2020-04-04 MED ORDER — SODIUM CHLORIDE 0.45 % IV SOLN: INTRAVENOUS | Status: DC

## 2020-04-04 MED ORDER — HEPARIN SODIUM (PORCINE) 5000 UNIT/ML IJ SOLN
INTRAMUSCULAR | Status: DC | PRN
  Administered 2020-04-04: 5000 [IU] via SUBCUTANEOUS

## 2020-04-04 MED ORDER — THROMBIN (RECOMBINANT) 5000 UNITS EX SOLR
CUTANEOUS | Status: AC
  Filled 2020-04-04: qty 5000

## 2020-04-04 MED ORDER — FAMOTIDINE 20 MG PO TABS
ORAL_TABLET | ORAL | Status: AC
  Administered 2020-04-04: 20 mg via ORAL
  Filled 2020-04-04: qty 1

## 2020-04-04 MED ORDER — ONDANSETRON HCL 4 MG/2ML IJ SOLN
INTRAMUSCULAR | Status: AC
  Filled 2020-04-04: qty 4

## 2020-04-04 MED ORDER — DOCUSATE SODIUM 100 MG PO CAPS
100.0000 mg | ORAL_CAPSULE | Freq: Two times a day (BID) | ORAL | Status: DC
  Administered 2020-04-04 – 2020-04-07 (×7): 100 mg via ORAL
  Filled 2020-04-04 (×7): qty 1

## 2020-04-04 MED ORDER — ATORVASTATIN CALCIUM 20 MG PO TABS
40.0000 mg | ORAL_TABLET | Freq: Every day | ORAL | Status: DC
  Administered 2020-04-06 – 2020-04-07 (×2): 40 mg via ORAL
  Filled 2020-04-04 (×3): qty 2

## 2020-04-04 MED ORDER — PHENYLEPHRINE HCL (PRESSORS) 10 MG/ML IV SOLN
INTRAVENOUS | Status: AC
  Filled 2020-04-04: qty 1

## 2020-04-04 MED ORDER — SODIUM CHLORIDE 0.9 % IV SOLN
INTRAVENOUS | Status: DC | PRN
  Administered 2020-04-04: 25 ug/min via INTRAVENOUS

## 2020-04-04 MED ORDER — TOPIRAMATE 100 MG PO TABS
100.0000 mg | ORAL_TABLET | Freq: Two times a day (BID) | ORAL | Status: DC
  Administered 2020-04-04 – 2020-04-07 (×6): 100 mg via ORAL
  Filled 2020-04-04 (×8): qty 1

## 2020-04-04 MED ORDER — ACETAMINOPHEN 10 MG/ML IV SOLN
INTRAVENOUS | Status: AC
  Filled 2020-04-04: qty 100

## 2020-04-04 MED ORDER — PROMETHAZINE HCL 25 MG/ML IJ SOLN: 6.2500 mg | Freq: Once | INTRAMUSCULAR | Status: AC

## 2020-04-04 MED ORDER — CHLORHEXIDINE GLUCONATE 0.12 % MT SOLN
OROMUCOSAL | Status: AC
  Administered 2020-04-04: 15 mL via OROMUCOSAL
  Filled 2020-04-04: qty 15

## 2020-04-04 MED ORDER — CIPROFLOXACIN IN D5W 400 MG/200ML IV SOLN
400.0000 mg | INTRAVENOUS | Status: AC
  Administered 2020-04-04: 400 mg via INTRAVENOUS

## 2020-04-04 MED ORDER — SUGAMMADEX SODIUM 500 MG/5ML IV SOLN
INTRAVENOUS | Status: AC
  Filled 2020-04-04: qty 5

## 2020-04-04 MED ORDER — KETAMINE HCL 50 MG/5ML IJ SOSY
PREFILLED_SYRINGE | INTRAMUSCULAR | Status: AC
  Filled 2020-04-04: qty 5

## 2020-04-04 MED ORDER — MIDAZOLAM HCL 2 MG/2ML IJ SOLN
INTRAMUSCULAR | Status: AC
  Filled 2020-04-04: qty 2

## 2020-04-04 MED ORDER — MIDAZOLAM HCL 2 MG/2ML IJ SOLN
INTRAMUSCULAR | Status: DC | PRN
  Administered 2020-04-04: 2 mg via INTRAVENOUS

## 2020-04-04 MED ORDER — SODIUM CHLORIDE 0.9 % IV SOLN: INTRAVENOUS | Status: DC

## 2020-04-04 MED ORDER — DEXAMETHASONE SODIUM PHOSPHATE 10 MG/ML IJ SOLN
INTRAMUSCULAR | Status: AC
  Filled 2020-04-04: qty 1

## 2020-04-04 MED ORDER — SUGAMMADEX SODIUM 500 MG/5ML IV SOLN
INTRAVENOUS | Status: DC | PRN
  Administered 2020-04-04: 400 mg via INTRAVENOUS

## 2020-04-04 MED ORDER — CIPROFLOXACIN HCL 500 MG PO TABS
500.0000 mg | ORAL_TABLET | Freq: Two times a day (BID) | ORAL | Status: AC
  Administered 2020-04-04 – 2020-04-05 (×4): 500 mg via ORAL
  Filled 2020-04-04 (×4): qty 1

## 2020-04-04 MED ORDER — ONDANSETRON HCL 4 MG/2ML IJ SOLN: 4.0000 mg | Freq: Once | INTRAMUSCULAR | Status: DC | PRN

## 2020-04-04 MED ORDER — GLYCOPYRROLATE 0.2 MG/ML IJ SOLN
INTRAMUSCULAR | Status: AC
  Filled 2020-04-04: qty 1

## 2020-04-04 MED ORDER — METFORMIN HCL 500 MG PO TABS
500.0000 mg | ORAL_TABLET | Freq: Two times a day (BID) | ORAL | Status: DC
Start: 2020-04-04 — End: 2020-04-05
  Administered 2020-04-04: 500 mg via ORAL
  Filled 2020-04-04 (×2): qty 1

## 2020-04-04 MED ORDER — DEXAMETHASONE SODIUM PHOSPHATE 10 MG/ML IJ SOLN
INTRAMUSCULAR | Status: DC | PRN
  Administered 2020-04-04: 10 mg via INTRAVENOUS

## 2020-04-04 MED ORDER — LIDOCAINE HCL (CARDIAC) PF 100 MG/5ML IV SOSY
PREFILLED_SYRINGE | INTRAVENOUS | Status: DC | PRN
  Administered 2020-04-04: 100 mg via INTRAVENOUS

## 2020-04-04 MED ORDER — PROMETHAZINE HCL 25 MG/ML IJ SOLN
INTRAMUSCULAR | Status: AC
  Administered 2020-04-04: 6.25 mg via INTRAVENOUS
  Filled 2020-04-04: qty 1

## 2020-04-04 MED ORDER — LIDOCAINE HCL (PF) 2 % IJ SOLN
INTRAMUSCULAR | Status: AC
  Filled 2020-04-04: qty 5

## 2020-04-04 MED ORDER — ACETAMINOPHEN 325 MG PO TABS
650.0000 mg | ORAL_TABLET | ORAL | Status: DC | PRN
  Administered 2020-04-06: 650 mg via ORAL
  Filled 2020-04-04: qty 2

## 2020-04-04 MED ORDER — HYDROMORPHONE HCL 1 MG/ML IJ SOLN
INTRAMUSCULAR | Status: DC | PRN
  Administered 2020-04-04 (×2): .5 mg via INTRAVENOUS

## 2020-04-04 MED ORDER — GLYCOPYRROLATE 0.2 MG/ML IJ SOLN
INTRAMUSCULAR | Status: DC | PRN
  Administered 2020-04-04: .2 mg via INTRAVENOUS

## 2020-04-04 MED ORDER — FENTANYL CITRATE (PF) 100 MCG/2ML IJ SOLN
INTRAMUSCULAR | Status: DC | PRN
  Administered 2020-04-04: 100 ug via INTRAVENOUS
  Administered 2020-04-04 (×2): 50 ug via INTRAVENOUS

## 2020-04-04 MED ORDER — FENTANYL CITRATE (PF) 100 MCG/2ML IJ SOLN
25.0000 ug | INTRAMUSCULAR | Status: DC | PRN
  Administered 2020-04-04: 50 ug via INTRAVENOUS

## 2020-04-04 MED ORDER — ORAL CARE MOUTH RINSE: 15.0000 mL | Freq: Once | OROMUCOSAL | Status: AC

## 2020-04-04 MED ORDER — EPHEDRINE 5 MG/ML INJ
INTRAVENOUS | Status: AC
  Filled 2020-04-04: qty 10

## 2020-04-04 MED ORDER — HYDROMORPHONE HCL 1 MG/ML IJ SOLN
INTRAMUSCULAR | Status: AC
  Filled 2020-04-04: qty 1

## 2020-04-04 MED ORDER — ACETAMINOPHEN 10 MG/ML IV SOLN
INTRAVENOUS | Status: DC | PRN
  Administered 2020-04-04: 1000 mg via INTRAVENOUS

## 2020-04-04 MED ORDER — PROMETHAZINE HCL 25 MG PO TABS
25.0000 mg | ORAL_TABLET | Freq: Four times a day (QID) | ORAL | Status: DC | PRN
  Administered 2020-04-04 – 2020-04-05 (×2): 25 mg via ORAL
  Filled 2020-04-04 (×2): qty 1

## 2020-04-04 MED ORDER — KETAMINE HCL 10 MG/ML IJ SOLN
INTRAMUSCULAR | Status: DC | PRN
  Administered 2020-04-04: 10 mg via INTRAVENOUS
  Administered 2020-04-04: 30 mg via INTRAVENOUS

## 2020-04-04 MED ORDER — FAMOTIDINE 20 MG PO TABS: 20.0000 mg | ORAL_TABLET | Freq: Once | ORAL | Status: AC

## 2020-04-04 MED ORDER — OXYCODONE HCL 5 MG PO TABS: 5.0000 mg | ORAL_TABLET | Freq: Once | ORAL | Status: DC | PRN

## 2020-04-04 MED ORDER — ROCURONIUM BROMIDE 10 MG/ML (PF) SYRINGE
PREFILLED_SYRINGE | INTRAVENOUS | Status: AC
  Filled 2020-04-04: qty 20

## 2020-04-04 MED ORDER — ROCURONIUM BROMIDE 100 MG/10ML IV SOLN
INTRAVENOUS | Status: DC | PRN
  Administered 2020-04-04 (×4): 20 mg via INTRAVENOUS
  Administered 2020-04-04: 80 mg via INTRAVENOUS

## 2020-04-04 MED ORDER — ACETAMINOPHEN 10 MG/ML IV SOLN: 1000.0000 mg | Freq: Once | INTRAVENOUS | Status: DC | PRN

## 2020-04-04 MED ORDER — CHLORHEXIDINE GLUCONATE 0.12 % MT SOLN: 15.0000 mL | Freq: Once | OROMUCOSAL | Status: AC

## 2020-04-04 MED ORDER — EPHEDRINE SULFATE 50 MG/ML IJ SOLN
INTRAMUSCULAR | Status: DC | PRN
  Administered 2020-04-04: 5 mg via INTRAVENOUS

## 2020-04-04 MED ORDER — DEXMEDETOMIDINE (PRECEDEX) IN NS 20 MCG/5ML (4 MCG/ML) IV SYRINGE
PREFILLED_SYRINGE | INTRAVENOUS | Status: AC
  Filled 2020-04-04: qty 5

## 2020-04-04 MED ORDER — HEPARIN SODIUM (PORCINE) 5000 UNIT/ML IJ SOLN
INTRAMUSCULAR | Status: AC
  Filled 2020-04-04: qty 1

## 2020-04-04 MED ORDER — ONDANSETRON HCL 4 MG/2ML IJ SOLN
INTRAMUSCULAR | Status: DC | PRN
  Administered 2020-04-04 (×2): 4 mg via INTRAVENOUS

## 2020-04-04 MED ORDER — SUCCINYLCHOLINE CHLORIDE 200 MG/10ML IV SOSY
PREFILLED_SYRINGE | INTRAVENOUS | Status: AC
  Filled 2020-04-04: qty 10

## 2020-04-04 MED ORDER — INSULIN ASPART PROT & ASPART (70-30 MIX) 100 UNIT/ML ~~LOC~~ SUSP: 40.0000 [IU] | Freq: Two times a day (BID) | SUBCUTANEOUS | Status: DC

## 2020-04-04 MED ORDER — BUPIVACAINE LIPOSOME 1.3 % IJ SUSP
INTRAMUSCULAR | Status: AC
  Filled 2020-04-04: qty 20

## 2020-04-04 MED ORDER — OXYCODONE HCL 5 MG PO TABS
5.0000 mg | ORAL_TABLET | ORAL | Status: DC | PRN
  Administered 2020-04-04 – 2020-04-05 (×2): 5 mg via ORAL
  Filled 2020-04-04 (×2): qty 1

## 2020-04-04 SURGICAL SUPPLY — 83 items
ANCHOR TIS RET SYS 1550ML (BAG) IMPLANT
APPLICATOR SURGIFLO ENDO (HEMOSTASIS) ×2 IMPLANT
APPLIER CLIP ROT 10 11.4 M/L (STAPLE)
APPLIER CLIP ROT 13.4 12 LRG (CLIP)
BASIN GRAD PLASTIC 32OZ STRL (MISCELLANEOUS) ×2 IMPLANT
CANISTER SUCT 1200ML W/VALVE (MISCELLANEOUS) IMPLANT
CHLORAPREP W/TINT 26 (MISCELLANEOUS) ×2 IMPLANT
CLEANER CAUTERY TIP 5X5 PAD (MISCELLANEOUS) ×1 IMPLANT
CLIP APPLIE ROT 10 11.4 M/L (STAPLE) IMPLANT
CLIP APPLIE ROT 13.4 12 LRG (CLIP) IMPLANT
CLIP VESOLOCK LG 6/CT PURPLE (CLIP) ×6 IMPLANT
COVER WAND RF STERILE (DRAPES) ×2 IMPLANT
CUTTER ECHEON FLEX ENDO 45 340 (ENDOMECHANICALS) IMPLANT
DEFOGGER SCOPE WARMER CLEARIFY (MISCELLANEOUS) ×2 IMPLANT
DERMABOND ADVANCED (GAUZE/BANDAGES/DRESSINGS) ×2
DERMABOND ADVANCED .7 DNX12 (GAUZE/BANDAGES/DRESSINGS) ×2 IMPLANT
DRAPE INCISE IOBAN 66X45 STRL (DRAPES) ×2 IMPLANT
DRAPE STERI POUCH LG 24X46 STR (DRAPES) ×2 IMPLANT
DRAPE SURG 17X11 SM STRL (DRAPES) ×8 IMPLANT
DRSG TELFA 3X8 NADH (GAUZE/BANDAGES/DRESSINGS) IMPLANT
ELECT CAUTERY BLADE 6.4 (BLADE) ×2 IMPLANT
ELECT REM PT RETURN 9FT ADLT (ELECTROSURGICAL) ×2
ELECTRODE REM PT RTRN 9FT ADLT (ELECTROSURGICAL) ×1 IMPLANT
GLOVE SURG ENC MOIS LTX SZ6.5 (GLOVE) ×2 IMPLANT
GLOVE SURG UNDER POLY LF SZ7.5 (GLOVE) ×4 IMPLANT
GOWN STRL REUS W/ TWL LRG LVL3 (GOWN DISPOSABLE) ×2 IMPLANT
GOWN STRL REUS W/ TWL XL LVL3 (GOWN DISPOSABLE) ×1 IMPLANT
GOWN STRL REUS W/TWL LRG LVL3 (GOWN DISPOSABLE) ×2
GOWN STRL REUS W/TWL XL LVL3 (GOWN DISPOSABLE) ×1
GRASPER SUT TROCAR 14GX15 (MISCELLANEOUS) ×2 IMPLANT
HANDLE YANKAUER SUCT BULB TIP (MISCELLANEOUS) IMPLANT
HEMOSTAT SURGICEL 2X14 (HEMOSTASIS) IMPLANT
HOLDER FOLEY CATH W/STRAP (MISCELLANEOUS) ×2 IMPLANT
IRRIGATION STRYKERFLOW (MISCELLANEOUS) ×1 IMPLANT
IRRIGATOR STRYKERFLOW (MISCELLANEOUS) ×2
KIT PINK PAD W/HEAD ARE REST (MISCELLANEOUS) ×2
KIT PINK PAD W/HEAD ARM REST (MISCELLANEOUS) ×1 IMPLANT
KIT TURNOVER KIT A (KITS) IMPLANT
L-HOOK LAP DISP 36CM (ELECTROSURGICAL) ×2
LABEL OR SOLS (LABEL) ×2 IMPLANT
LHOOK LAP DISP 36CM (ELECTROSURGICAL) ×1 IMPLANT
LIGASURE LAP ATLAS 10MM 37CM (INSTRUMENTS) ×2 IMPLANT
LOOP RED MAXI  1X406MM (MISCELLANEOUS)
LOOP VESSEL MAXI 1X406 RED (MISCELLANEOUS) IMPLANT
MANIFOLD NEPTUNE II (INSTRUMENTS) ×2 IMPLANT
NEEDLE HYPO 21X1.5 SAFETY (NEEDLE) ×2 IMPLANT
NEEDLE HYPO 22GX1.5 SAFETY (NEEDLE) ×2 IMPLANT
PACK LAP CHOLECYSTECTOMY (MISCELLANEOUS) ×2 IMPLANT
PAD CLEANER CAUTERY TIP 5X5 (MISCELLANEOUS) ×1
PENCIL ELECTRO HAND CTR (MISCELLANEOUS) ×2 IMPLANT
RELOAD STAPLER WHITE 60MM (STAPLE) ×5 IMPLANT
SCISSORS METZENBAUM CVD 33 (INSTRUMENTS) IMPLANT
SET TUBE SMOKE EVAC HIGH FLOW (TUBING) ×2 IMPLANT
SPOGE SURGIFLO 8M (HEMOSTASIS) ×1
SPONGE KITTNER 5P (MISCELLANEOUS) ×2 IMPLANT
SPONGE LAP 18X18 RF (DISPOSABLE) ×4 IMPLANT
SPONGE SURGIFLO 8M (HEMOSTASIS) ×1 IMPLANT
STAPLE RELOAD 2.5MM WHITE (STAPLE) IMPLANT
STAPLE RELOAD 45 WHT (STAPLE) IMPLANT
STAPLE RELOAD 45MM WHITE (STAPLE)
STAPLER ECHELON LONG 60 440 (INSTRUMENTS) ×2 IMPLANT
STAPLER RELOAD WHITE 60MM (STAPLE) ×10
STAPLER SKIN PROX 35W (STAPLE) ×2 IMPLANT
STAPLER VASCULAR ECHELON 35 (CUTTER) IMPLANT
SURGILUBE 2OZ TUBE FLIPTOP (MISCELLANEOUS) ×2 IMPLANT
SUT MNCRL AB 4-0 PS2 18 (SUTURE) ×2 IMPLANT
SUT PDS AB 1 TP1 96 (SUTURE) ×2 IMPLANT
SUT VIC AB 0 CT1 36 (SUTURE) IMPLANT
SUT VIC AB 1 CT1 36 (SUTURE) IMPLANT
SUT VIC AB 2-0 SH 27 (SUTURE) ×1
SUT VIC AB 2-0 SH 27XBRD (SUTURE) ×1 IMPLANT
SUT VIC AB 2-0 UR6 27 (SUTURE) IMPLANT
SUT VIC AB 4-0 FS2 27 (SUTURE) IMPLANT
SUT VICRYL 0 AB UR-6 (SUTURE) IMPLANT
SUT VICRYL 0 TIES 12 18 (SUTURE) ×2 IMPLANT
SYR 20ML LL LF (SYRINGE) ×2 IMPLANT
SYS LAPSCP GELPORT 120MM (MISCELLANEOUS) ×2
SYSTEM LAPSCP GELPORT 120MM (MISCELLANEOUS) ×1 IMPLANT
TRAY FOLEY MTR SLVR 16FR STAT (SET/KITS/TRAYS/PACK) ×2 IMPLANT
TROCAR ENDOPATH XCEL 12X100 BL (ENDOMECHANICALS) ×4 IMPLANT
TROCAR XCEL 12X100 BLDLESS (ENDOMECHANICALS) ×2 IMPLANT
WATER STERILE IRR 1000ML POUR (IV SOLUTION) ×2 IMPLANT
WATER STERILE IRR 3000ML UROMA (IV SOLUTION) ×2 IMPLANT

## 2020-04-04 NOTE — Op Note (Signed)
Date of procedure: 04/04/20  Preoperative diagnosis:  1. Left xanthogranulomatous pyelonephritis  Postoperative diagnosis:  1. Same  Procedure: 1. Laparoscopic hand-assisted left simple nephrectomy(with 22 modifier)  Surgeon: Legrand Rams, MD  Assistant: Vanna Scotland, MD  Anesthesia: General  Complications: None  Intraoperative findings:  1.  Extremely fibrotic and scarred atrophic left kidney, no purulence noted Intra-Op  EBL: 200 mL  Specimens: Left kidney  Drains: 16 French Foley  Indication: Katelyn Barnes is a 40 y.o. patient who originally presented in May 2021 with severe left-sided pyelonephritis and hydronephrosis and a stent was placed.  Follow-up diagnostic ureteroscopy showed no abnormalities.  She has developed recurrent episodes of left pyelonephritis and left renal abscesses over the last 9 months despite multiple courses of antibiotics as well as percutaneous drainage with interventional radiology.  Recent CT and NM renal scan showed a atrophic left kidney with only 11% function.  After extensive conversation of the risks benefits, she opted for a left nephrectomy.  After reviewing the management options for treatment, they elected to proceed with the above surgical procedure(s). We have discussed the potential benefits and risks of the procedure, side effects of the proposed treatment, the likelihood of the patient achieving the goals of the procedure, and any potential problems that might occur during the procedure or recuperation. Informed consent has been obtained.  Description of procedure:  The patient was taken to the operating room and general anesthesia was induced. SCDs were placed for DVT prophylaxis and 5000 units of subcutaneous heparin were given. The patient was placed in the modified flank position with the left side up, prepped and draped in the usual sterile fashion, and preoperative antibiotics(Cipro) were administered. A preoperative  time-out was performed.   A 6 cm incision was made above the umbilicus.  Using electrocautery we dissected down through subcutaneous tissue until the fascia was identified.  This was opened sharply with Metzenbaums scissors and the peritoneal cavity was entered.  The hand-assisted gel port was placed, and the abdomen was insufflated.  Under direct vision two 11 mm ports were placed in the left lower quadrant spaced 8 cm apart.  The Carter-Thomason was used to pre-place Vicryl sutures for closure and these were snapped in place and ports replaced.   I started by taking down the colon using the LigaSure device and this was reflected medially.  I could appreciate the atrophic and fibrotic kidney and the planes were not clearly evident.  I started by identifying the gonadal vein and the ureter medially and these were followed proximally towards the hilum.  There was significant fibrosis and inflammation, and it took at least 90 minutes to follow these structures proximally.  The hilum still was not clear, and at this point I opted to work laterally and superiorly.  Laterally, there was significant scar tissue from her prior percutaneous drain, and the LigaSure and cautery hook were used to dissect the kidney from the sidewall where it was quite stuck.  Superiorly, the kidney was also very stuck to the spleen with numerous adhesions, and the LigaSure and cautery hook were again used to carefully open this plane.  No splenic injury was noted.  Ultimately, I was able to identify a plane near the upper pole and worked this down inferiorly until a large packet with the hilum was isolated.  This was divided into 2 packets with the artery and vein and taken with the 60 mm vascular stapler.  A small amount of adrenal tissue was taken with  the kidney.  Weck clips were placed on the ureter and this was divided sharply.    The kidney was removed through the GelPort and sent for pathology.  The nephrectomy bed was copiously  irrigated with saline.  There was excellent hemostasis with the pneumoperitoneum down to 7 mmHg.  Surgicel and Surgi-Flo were placed in the nephrectomy bed and where the kidney had been stuck to the lateral sidewall.  The fascia of the midline incision was closed using a running 0 looped PDS, and the previously placed port site stitches were tied.  The incisions were again irrigated copiously.  Interrupted Vicryl sutures were used to reapproximate the subcutaneous tissue at the midline, and running 4-0 Monocryl was used to close the skin.  Dermabond was applied.   22 modifier as this case took twice as long as usual, and an additional hour and a half was taken dissecting through fibrotic inflammatory tissue surrounding the entire kidney and the hilum.  Disposition: Stable to PACU  Plan: Admit overnight, clear liquid diet, advance tolerated as tolerated   Legrand Rams, MD

## 2020-04-04 NOTE — Anesthesia Preprocedure Evaluation (Signed)
Anesthesia Evaluation  Patient identified by MRN, date of birth, ID band Patient awake    Reviewed: Allergy & Precautions, NPO status , Patient's Chart, lab work & pertinent test results  History of Anesthesia Complications Negative for: history of anesthetic complications  Airway Mallampati: II  TM Distance: >3 FB Neck ROM: Full    Dental no notable dental hx. (+) Teeth Intact   Pulmonary neg pulmonary ROS, neg sleep apnea, neg COPD, Patient abstained from smoking.Not current smoker,    Pulmonary exam normal breath sounds clear to auscultation       Cardiovascular Exercise Tolerance: Good METShypertension, (-) CAD and (-) Past MI (-) dysrhythmias  Rhythm:Regular Rate:Normal - Systolic murmurs    Neuro/Psych Anxiety Depression  Neuromuscular disease negative psych ROS   GI/Hepatic neg GERD  ,(+)     (-) substance abuse  ,   Endo/Other  diabetes, Type 2, Insulin Dependent  Renal/GU Renal disease     Musculoskeletal   Abdominal   Peds  Hematology  (+) Blood dyscrasia, anemia ,   Anesthesia Other Findings Past Medical History: No date: Anemia     Comment:  iron transfusions x 2 No date: COVID-19 No date: DDD (degenerative disc disease), lumbar No date: Diabetes mellitus without complication (HCC)     Comment:  Type II 2016: DVT (deep venous thrombosis) (HCC)     Comment:  post back surgery - was on Eliquis 3 months No date: Kidney stone  Reproductive/Obstetrics                             Anesthesia Physical Anesthesia Plan  ASA: II  Anesthesia Plan: General   Post-op Pain Management:    Induction: Intravenous  PONV Risk Score and Plan: 4 or greater and Ondansetron, Dexamethasone and Midazolam  Airway Management Planned: Oral ETT  Additional Equipment: None  Intra-op Plan:   Post-operative Plan: Extubation in OR  Informed Consent: I have reviewed the patients History  and Physical, chart, labs and discussed the procedure including the risks, benefits and alternatives for the proposed anesthesia with the patient or authorized representative who has indicated his/her understanding and acceptance.     Dental advisory given  Plan Discussed with: CRNA and Surgeon  Anesthesia Plan Comments: (Discussed risks of anesthesia with patient, including PONV, sore throat, lip/dental damage. Rare risks discussed as well, such as bleeding requiring blood transfusion (she is OK with this), cardiorespiratory and neurological sequelae. Patient understands.)        Anesthesia Quick Evaluation

## 2020-04-04 NOTE — Anesthesia Procedure Notes (Signed)
Procedure Name: Intubation Performed by: Fletcher-Harrison, Jalee Saine, CRNA Pre-anesthesia Checklist: Patient identified, Emergency Drugs available, Suction available and Patient being monitored Patient Re-evaluated:Patient Re-evaluated prior to induction Oxygen Delivery Method: Circle system utilized Preoxygenation: Pre-oxygenation with 100% oxygen Induction Type: IV induction Ventilation: Mask ventilation without difficulty Laryngoscope Size: McGraph and 3 Grade View: Grade I Tube type: Oral Number of attempts: 1 Airway Equipment and Method: Stylet and Oral airway Placement Confirmation: ETT inserted through vocal cords under direct vision,  positive ETCO2,  breath sounds checked- equal and bilateral and CO2 detector Secured at: 21 cm Tube secured with: Tape Dental Injury: Teeth and Oropharynx as per pre-operative assessment        

## 2020-04-04 NOTE — Progress Notes (Signed)
Patient refused Ted hose. Education given.  Patient wearing SCD's and receiving Heparin SQ.

## 2020-04-04 NOTE — Transfer of Care (Signed)
Immediate Anesthesia Transfer of Care Note  Patient: Katelyn Barnes  Procedure(s) Performed: HAND ASSISTED LAPAROSCOPIC NEPHRECTOMY (Left Abdomen)  Patient Location: PACU  Anesthesia Type:General  Level of Consciousness: drowsy  Airway & Oxygen Therapy: Patient Spontanous Breathing and Patient connected to face mask oxygen  Post-op Assessment: Report given to RN  Post vital signs: stable  Last Vitals:  Vitals Value Taken Time  BP 118/74 04/04/20 1217  Temp    Pulse 91 04/04/20 1221  Resp 23 04/04/20 1221  SpO2 100 % 04/04/20 1221  Vitals shown include unvalidated device data.  Last Pain:  Vitals:   04/04/20 0623  TempSrc: Temporal  PainSc: 0-No pain         Complications: No complications documented.

## 2020-04-04 NOTE — H&P (Signed)
   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  Here today for left laparoscopic nephrectomy.  She denies any fevers, chills, chest pain, or shortness of breath.  PMH: Past Medical History:  Diagnosis Date  . Anemia    iron transfusions x 2  . COVID-19   . DDD (degenerative disc disease), lumbar   . Diabetes mellitus without complication (HCC)    Type II  . DVT (deep venous thrombosis) (HCC) 2016   post back surgery - was on Eliquis 3 months  . Kidney stone     Surgical History: Past Surgical History:  Procedure Laterality Date  . ANTERIOR CERVICAL DECOMP/DISCECTOMY FUSION Right 05/05/2016   Procedure: ANTERIOR CERVICAL DECOMPRESSION FUSION, CERVICAL 7- THORACIC 1 WITH INSTRUMENTATION AND ALLOGRAFT;  Surgeon: Estill Bamberg, MD;  Location: [facility] OR;  Service: Orthopedics;  Laterality: Right;  ANTERIOR CERVICAL DECOMPRESSION FUSION, CERVICAL 7- THORACIC 1 WITH INSTRUMENTATION AND ALLOGRAFT  . BACK SURGERY  2016   discectomy   . CARPAL TUNNEL RELEASE Bilateral   . CYSTOSCOPY W/ RETROGRADES Left 07/06/2019   Procedure: CYSTOSCOPY WITH RETROGRADE PYELOGRAM;  Surgeon: [name], MD;  Location: [facility] ORS;  Service: Urology;  Laterality: Left;  . CYSTOSCOPY WITH URETEROSCOPY AND STENT PLACEMENT Left 06/11/2019   Procedure: CYSTOSCOPY WITH URETEROSCOPY AND STENT PLACEMENT;  Surgeon: Jerilee Field, MD;  Location: [facility] ORS;  Service: Urology;  Laterality: Left;  . CYSTOSCOPY WITH URETEROSCOPY AND STENT PLACEMENT Left 07/06/2019   Procedure: CYSTOSCOPY WITH URETEROSCOPY AND STENT exchange;  Surgeon: [name], MD;  Location: [facility] ORS;  Service: Urology;  Laterality: Left;  . IR NEPHROSTOMY EXCHANGE LEFT  10/19/2019  . SHOULDER ARTHROSCOPY     . TUBAL LIGATION  08/2007     Family History: History reviewed. No pertinent family history.  Social History:  reports that she has never smoked. She has never used smokeless tobacco. She reports that she does not drink alcohol and does not use drugs.  Physical Exam: BP 115/72   Pulse 80   Temp (!) 97.2 F (36.2 C) (Temporal)   Resp 12   Ht 5\' 6"  (1.676 m)   Wt 99.8 kg   LMP 03/11/2020 (Exact Date) Comment: Tubal Ligation  SpO2 100%   BMI 35.51 kg/m    Constitutional:  Alert and oriented, No acute distress. Cardiovascular: Regular rate and rhythm Respiratory: Clear to auscultation bilaterally GI: Abdomen is soft, nontender, nondistended, no abdominal masses  Laboratory Data: Reviewed   Assessment & Plan:   40 year old female with recurrent left pyelonephritis and left renal abscesses over the last 9 months, has failed multiple long courses of antibiotics as well as percutaneous drainage. NM renal scan showed only 11% function on the left side with imaging consistent with XGP kidney.  We had extensive conversation about the risks and benefits of nephrectomy including bleeding, infection, recurrence, flank pain, deterioration of renal function, and possible need for additional procedures in the future.  Left laparoscopic nephrectomy today  40, MD 04/04/2020  [facility] [address] [place], [place] [id number]

## 2020-04-04 NOTE — Progress Notes (Signed)
Initial Nutrition Assessment  DOCUMENTATION CODES:   Obesity unspecified  INTERVENTION:   -RD will follow for diet advancement and add supplements as appropriate   NUTRITION DIAGNOSIS:   Increased nutrient needs related to post-op healing as evidenced by estimated needs.  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  Here today for left laparoscopic nephrectomy.  She denies any fevers, chills, chest pain, or shortness of breath.  Pt admitted with recurrent lt pyelonephritis.   Reviewed I/O's: +500 ml x 24 hours  UOP: 300 ml x 24 hours  Per urology notes, plan for nephrectomy today. Pt has with recurrent lt pyelonephritis and lt renal abscess over the past 9 months; failed courses of antibiotics and percutaneous drainage.   Pt unavailable at time of visit. Unable to obtain further nutriton-related history or completion nutriton-focused physical exam at this time.   Medications reviewed and include 0.9% sodium chloride infusion @ 100 ml/hr.   Labs reviewed: CBGS: 194.  (inpatient orders for glycemic control are none).   Diet Order:   Diet Order            Diet NPO time specified  Diet effective midnight                 EDUCATION NEEDS:   No education needs have been identified at this time  Skin:  Skin Assessment: Skin Integrity Issues: Skin Integrity Issues:: Incisions Incisions: closed abdomen  Last BM:  Unknown  Height:   Ht Readings from Last 1 Encounters:  04/04/20 5\' 6"  (1.676 m)    Weight:   Wt Readings from Last 1 Encounters:  04/04/20 99.8 kg    Ideal Body Weight:   64.5 kg  BMI:  Body mass index is 35.51 kg/m.  Estimated Nutritional Needs:   Kcal:  1750-1950  Protein:  85-100 grams  Fluid:  > 1.7 L    04/06/20, RD, LDN, CDCES Registered Dietitian II Certified Diabetes Care and Education Specialist Please refer to [facility] for RD and/or RD on-call/weekend/after hours pager

## 2020-04-04 NOTE — Anesthesia Postprocedure Evaluation (Signed)
Anesthesia Post Note  Patient: Amita Atayde  Procedure(s) Performed: HAND ASSISTED LAPAROSCOPIC NEPHRECTOMY (Left Abdomen)  Patient location during evaluation: PACU Anesthesia Type: General Level of consciousness: awake and alert Pain management: pain level controlled Vital Signs Assessment: post-procedure vital signs reviewed and stable Respiratory status: spontaneous breathing, nonlabored ventilation, respiratory function stable and patient connected to nasal cannula oxygen Cardiovascular status: blood pressure returned to baseline and stable Postop Assessment: no apparent nausea or vomiting Anesthetic complications: no   No complications documented.   Last Vitals:  Vitals:   04/04/20 1300 04/04/20 1330  BP: 127/76 124/72  Pulse: 94 95  Resp: 15   Temp: 36.9 C 36.9 C  SpO2: 96% 98%    Last Pain:  Vitals:   04/04/20 1330  TempSrc: Oral  PainSc:                  Corinda Gubler

## 2020-04-05 ENCOUNTER — Encounter: Payer: Self-pay | Admitting: Urology

## 2020-04-05 LAB — BASIC METABOLIC PANEL
Anion gap: 5 (ref 5–15)
BUN: 23 mg/dL — ABNORMAL HIGH (ref 6–20)
CO2: 19 mmol/L — ABNORMAL LOW (ref 22–32)
Calcium: 8.5 mg/dL — ABNORMAL LOW (ref 8.9–10.3)
Chloride: 114 mmol/L — ABNORMAL HIGH (ref 98–111)
Creatinine, Ser: 1.13 mg/dL — ABNORMAL HIGH (ref 0.44–1.00)
GFR, Estimated: >60 mL/min (ref >60)
Glucose, Bld: 136 mg/dL — ABNORMAL HIGH (ref 70–99)
Potassium: 3.8 mmol/L (ref 3.5–5.1)
Sodium: 138 mmol/L (ref 135–145)

## 2020-04-05 LAB — CBC
HCT: 30.2 % — ABNORMAL LOW (ref 36.0–46.0)
Hemoglobin: 9.7 g/dL — ABNORMAL LOW (ref 12.0–15.0)
MCH: 30.4 pg (ref 26.0–34.0)
MCHC: 32.1 g/dL (ref 30.0–36.0)
MCV: 94.7 fL (ref 80.0–100.0)
Platelets: 131 10*3/uL — ABNORMAL LOW (ref 150–400)
RBC: 3.19 MIL/uL — ABNORMAL LOW (ref 3.87–5.11)
RDW: 13.9 % (ref 11.5–15.5)
WBC: 7.2 10*3/uL (ref 4.0–10.5)
nRBC: 0 % (ref 0.0–0.2)

## 2020-04-05 LAB — GLUCOSE, CAPILLARY
Glucose-Capillary: 132 mg/dL — ABNORMAL HIGH (ref 70–99)
Glucose-Capillary: 153 mg/dL — ABNORMAL HIGH (ref 70–99)
Glucose-Capillary: 137 mg/dL — ABNORMAL HIGH (ref 70–99)
Glucose-Capillary: 117 mg/dL — ABNORMAL HIGH (ref 70–99)

## 2020-04-05 MED ORDER — SODIUM CHLORIDE 0.9 % IV SOLN
12.5000 mg | Freq: Four times a day (QID) | INTRAVENOUS | Status: DC | PRN
  Filled 2020-04-05: qty 0.5

## 2020-04-05 MED ORDER — CHLORHEXIDINE GLUCONATE CLOTH 2 % EX PADS: 6.0000 | MEDICATED_PAD | Freq: Every day | CUTANEOUS | Status: DC

## 2020-04-05 MED ORDER — INSULIN ASPART 100 UNIT/ML ~~LOC~~ SOLN: 0.0000 [IU] | Freq: Every day | SUBCUTANEOUS | Status: DC

## 2020-04-05 MED ORDER — INSULIN ASPART 100 UNIT/ML ~~LOC~~ SOLN
0.0000 [IU] | Freq: Three times a day (TID) | SUBCUTANEOUS | Status: DC
  Administered 2020-04-05: 2 [IU] via SUBCUTANEOUS
  Administered 2020-04-05: 3 [IU] via SUBCUTANEOUS
  Administered 2020-04-06 – 2020-04-07 (×5): 2 [IU] via SUBCUTANEOUS
  Filled 2020-04-05 (×7): qty 1

## 2020-04-05 MED ORDER — INSULIN ASPART 100 UNIT/ML ~~LOC~~ SOLN
4.0000 [IU] | Freq: Three times a day (TID) | SUBCUTANEOUS | Status: DC
  Administered 2020-04-06 – 2020-04-07 (×4): 4 [IU] via SUBCUTANEOUS
  Filled 2020-04-05 (×5): qty 1

## 2020-04-05 MED ORDER — OXYCODONE HCL 5 MG PO TABS
5.0000 mg | ORAL_TABLET | ORAL | Status: DC | PRN
  Administered 2020-04-05 – 2020-04-06 (×5): 10 mg via ORAL
  Administered 2020-04-07: 5 mg via ORAL
  Administered 2020-04-07: 10 mg via ORAL
  Filled 2020-04-05 (×6): qty 2
  Filled 2020-04-05: qty 1

## 2020-04-05 NOTE — Progress Notes (Signed)
   Subjective Left flank pain overnight, improved with IV pain medications Nausea overnight improved with IV Zofran Tolerating clear liquids this morning  Physical Exam: BP 117/72 (BP Location: Left Arm)   Pulse 100   Temp 98.5 F (36.9 C) (Oral)   Resp 16   Ht 5\' 6"  (1.676 m)   Wt 99.8 kg   LMP 03/11/2020 (Exact Date) Comment: Tubal Ligation  SpO2 98%   BMI 35.51 kg/m    Constitutional:  Alert and oriented, No acute distress. Respiratory: Normal respiratory effort, no increased work of breathing. GI: Abdomen is soft, appropriately-tender, non-distended Incisions clean dry and intact with Dermabond, no port site tenderness Drains: Foley with clear yellow urine  Laboratory Data: Reviewed in epic, creatinine and hematocrit stable   Assessment & Plan:   40 year old female with XGP non-functioning left kidney, POD#1 left laparoscopic nephrectomy.  Recovering appropriately, labs and exam reassuring.  Continue clear liquid diet today, okay to advance to general if nausea improved Remove Foley Ambulate x4, abdominal binder when ambulating.  Continue IS DM- SSI Dispo: Tentatively discharge home tomorrow if tolerating general diet and pain controlled   41, MD

## 2020-04-05 NOTE — Progress Notes (Signed)
   04/04/20 2128  Assess: MEWS Score  Temp 99.3 F (37.4 C)  BP 123/72  Pulse Rate (!) 111  Resp (!) 22  SpO2 100 %  Assess: MEWS Score  MEWS Temp 0  MEWS Systolic 0  MEWS Pulse 2  MEWS RR 1  MEWS LOC 0  MEWS Score 3  MEWS Score Color Yellow  Assess: if the MEWS score is Yellow or Red  Were vital signs taken at a resting state? Yes  Focused Assessment No change from prior assessment  Early Detection of Sepsis Score *See Row Information* Low  MEWS guidelines implemented *See Row Information* Yes  Treat  MEWS Interventions Administered prn meds/treatments  Pain Scale 0-10  Pain Score 7  Pain Type Acute pain  Pain Location Abdomen  Take Vital Signs  Increase Vital Sign Frequency  Yellow: Q 2hr X 2 then Q 4hr X 2, if remains yellow, continue Q 4hrs  Escalate  MEWS: Escalate Yellow: discuss with charge nurse/RN and consider discussing with provider and RRT  Notify: Charge Nurse/RN  Name of Charge Nurse/RN Notified Anette Guarneri, RN  Date Charge Nurse/RN Notified 04/04/20  Time Charge Nurse/RN Notified 2200  Document  Patient Outcome Stabilized after interventions  Inserted for [employer]

## 2020-04-06 DIAGNOSIS — R110 Nausea: Secondary | ICD-10-CM | POA: Diagnosis present

## 2020-04-06 DIAGNOSIS — R310 Gross hematuria: Secondary | ICD-10-CM | POA: Diagnosis not present

## 2020-04-06 DIAGNOSIS — Z881 Allergy status to other antibiotic agents status: Secondary | ICD-10-CM | POA: Diagnosis not present

## 2020-04-06 DIAGNOSIS — Z87442 Personal history of urinary calculi: Secondary | ICD-10-CM | POA: Diagnosis not present

## 2020-04-06 DIAGNOSIS — Z981 Arthrodesis status: Secondary | ICD-10-CM | POA: Diagnosis not present

## 2020-04-06 DIAGNOSIS — Z880 Allergy status to penicillin: Secondary | ICD-10-CM | POA: Diagnosis not present

## 2020-04-06 DIAGNOSIS — N261 Atrophy of kidney (terminal): Secondary | ICD-10-CM | POA: Diagnosis present

## 2020-04-06 DIAGNOSIS — Z8616 Personal history of COVID-19: Secondary | ICD-10-CM | POA: Diagnosis not present

## 2020-04-06 DIAGNOSIS — N12 Tubulo-interstitial nephritis, not specified as acute or chronic: Secondary | ICD-10-CM | POA: Diagnosis present

## 2020-04-06 DIAGNOSIS — N10 Acute tubulo-interstitial nephritis: Secondary | ICD-10-CM | POA: Diagnosis present

## 2020-04-06 DIAGNOSIS — E119 Type 2 diabetes mellitus without complications: Secondary | ICD-10-CM | POA: Diagnosis present

## 2020-04-06 LAB — CBC WITH DIFFERENTIAL/PLATELET
Abs Immature Granulocytes: 0.02 10*3/uL (ref 0.00–0.07)
Basophils Absolute: 0 10*3/uL (ref 0.0–0.1)
Basophils Relative: 0 %
Eosinophils Absolute: 0.1 10*3/uL (ref 0.0–0.5)
Eosinophils Relative: 1 %
HCT: 29.1 % — ABNORMAL LOW (ref 36.0–46.0)
Hemoglobin: 9.6 g/dL — ABNORMAL LOW (ref 12.0–15.0)
Immature Granulocytes: 0 %
Lymphocytes Relative: 21 %
Lymphs Abs: 1.2 10*3/uL (ref 0.7–4.0)
MCH: 30.4 pg (ref 26.0–34.0)
MCHC: 33 g/dL (ref 30.0–36.0)
MCV: 92.1 fL (ref 80.0–100.0)
Monocytes Absolute: 0.6 10*3/uL (ref 0.1–1.0)
Monocytes Relative: 11 %
Neutro Abs: 4 10*3/uL (ref 1.7–7.7)
Neutrophils Relative %: 67 %
Platelets: 139 10*3/uL — ABNORMAL LOW (ref 150–400)
RBC: 3.16 MIL/uL — ABNORMAL LOW (ref 3.87–5.11)
RDW: 13.8 % (ref 11.5–15.5)
WBC: 6 10*3/uL (ref 4.0–10.5)
nRBC: 0 % (ref 0.0–0.2)

## 2020-04-06 LAB — URINALYSIS, COMPLETE (UACMP) WITH MICROSCOPIC
Bilirubin Urine: NEGATIVE
Glucose, UA: NEGATIVE mg/dL
Ketones, ur: NEGATIVE mg/dL
Leukocytes,Ua: NEGATIVE
Nitrite: NEGATIVE
Protein, ur: NEGATIVE mg/dL
Specific Gravity, Urine: 1.008 (ref 1.005–1.030)
pH: 6 (ref 5.0–8.0)

## 2020-04-06 LAB — COMPREHENSIVE METABOLIC PANEL
ALT: 21 U/L (ref 0–44)
AST: 22 U/L (ref 15–41)
Albumin: 3.4 g/dL — ABNORMAL LOW (ref 3.5–5.0)
Alkaline Phosphatase: 27 U/L — ABNORMAL LOW (ref 38–126)
Anion gap: 7 (ref 5–15)
BUN: 20 mg/dL (ref 6–20)
CO2: 20 mmol/L — ABNORMAL LOW (ref 22–32)
Calcium: 8.3 mg/dL — ABNORMAL LOW (ref 8.9–10.3)
Chloride: 109 mmol/L (ref 98–111)
Creatinine, Ser: 1.3 mg/dL — ABNORMAL HIGH (ref 0.44–1.00)
GFR, Estimated: 53 mL/min — ABNORMAL LOW (ref >60)
Glucose, Bld: 148 mg/dL — ABNORMAL HIGH (ref 70–99)
Potassium: 3.9 mmol/L (ref 3.5–5.1)
Sodium: 136 mmol/L (ref 135–145)
Total Bilirubin: 1.7 mg/dL — ABNORMAL HIGH (ref 0.3–1.2)
Total Protein: 6.8 g/dL (ref 6.5–8.1)

## 2020-04-06 LAB — GLUCOSE, CAPILLARY
Glucose-Capillary: 134 mg/dL — ABNORMAL HIGH (ref 70–99)
Glucose-Capillary: 124 mg/dL — ABNORMAL HIGH (ref 70–99)
Glucose-Capillary: 124 mg/dL — ABNORMAL HIGH (ref 70–99)
Glucose-Capillary: 130 mg/dL — ABNORMAL HIGH (ref 70–99)
Glucose-Capillary: 131 mg/dL — ABNORMAL HIGH (ref 70–99)

## 2020-04-06 MED ORDER — CIPROFLOXACIN HCL 500 MG PO TABS
500.0000 mg | ORAL_TABLET | Freq: Two times a day (BID) | ORAL | Status: DC
  Administered 2020-04-06: 500 mg via ORAL
  Filled 2020-04-06: qty 1

## 2020-04-06 NOTE — Progress Notes (Signed)
MD updated throughout day- patient still has not passed gas post-op and has had blood in urine including small clots. Encouraged to ambulate throughout the day- has ambulated 654ft x2. Afebrile throughout day. MD to re-evaluate in morning.

## 2020-04-06 NOTE — Progress Notes (Signed)
   Subjective Tolerated general diet yesterday, ambulating with binder Pain control improved, nausea resolved Episode of gross hematuria this morning, Foley removed yesterday  Physical Exam: BP 135/79 (BP Location: Left Arm)   Pulse 100   Temp 98.9 F (37.2 C) (Oral)   Resp 15   Ht 5\' 6"  (1.676 m)   Wt 99.8 kg   LMP 03/11/2020 (Exact Date) Comment: Tubal Ligation  SpO2 97%   BMI 35.51 kg/m    Constitutional:  Alert and oriented, No acute distress. Respiratory: Normal respiratory effort, no increased work of breathing. GI: Abdomen is soft, appropriately-tender, non-distended.  Tenderness primarily left flank. Incisions clean dry and intact with Dermabond, no port site tenderness  Laboratory Data: Pending this morning  Assessment & Plan:   40 year old female with XGP non-functioning left kidney, POD#2 left laparoscopic nephrectomy.  Recovering appropriately, exam reassuring, labs pending this morning.  Continue general diet, D/C mIVF Ambulate, abdominal binder when ambulating.  Continue IS DM- SSI Dispo:  Discharge today versus tomorrow pending lab work this morning   41, MD

## 2020-04-06 NOTE — Progress Notes (Signed)
I have personally assessed the patient and agree with the assessment data collected by Rande Lawman, SN

## 2020-04-07 LAB — URINE CULTURE: Culture: <10000 — AB

## 2020-04-07 LAB — GLUCOSE, CAPILLARY
Glucose-Capillary: 124 mg/dL — ABNORMAL HIGH (ref 70–99)
Glucose-Capillary: 144 mg/dL — ABNORMAL HIGH (ref 70–99)

## 2020-04-07 MED ORDER — ONDANSETRON HCL 4 MG PO TABS: 4.0000 mg | ORAL_TABLET | Freq: Three times a day (TID) | ORAL | 0 refills | Status: AC | PRN

## 2020-04-07 MED ORDER — BISACODYL 10 MG RE SUPP: 10.0000 mg | Freq: Every day | RECTAL | Status: DC | PRN

## 2020-04-07 MED ORDER — DOCUSATE SODIUM 100 MG PO CAPS
100.0000 mg | ORAL_CAPSULE | Freq: Two times a day (BID) | ORAL | 0 refills | Status: AC
Start: 2020-04-07 — End: ?

## 2020-04-07 MED ORDER — OXYCODONE HCL 5 MG PO TABS: 5.0000 mg | ORAL_TABLET | ORAL | 0 refills | Status: AC | PRN

## 2020-04-07 NOTE — Discharge Summary (Signed)
Physician Discharge Summary  Patient ID: Katelyn Barnes MRN: [id number] DOB/AGE: [birth year] 40 y.o.  Admit date: 04/04/2020 Discharge date: 04/07/2020  Admission Diagnoses: Xanthogranulomatous pyelonephritis  Discharge Diagnoses: Same  Discharged Condition: good  Hospital Course:  40 year old female with chronically infected left kidney and xanthogranulomatous pyelonephritis.  She underwent an elective laparoscopic left hand-assisted simple nephrectomy.  She had return of bowel function on postop day 3 and was tolerating a general diet and pain was controlled with oral pain medications.  Catheter was removed and she was voiding spontaneously.  Lab work was stable, and urinalysis showed no evidence of infection.  Discharge Exam: Blood pressure 95/61, pulse 90, temperature 98.6 F (37 C), temperature source Oral, resp. rate 16, height 5\' 6"  (1.676 m), weight 99.8 kg, last menstrual period 03/11/2020, SpO2 99 %.  Alert, no acute distress Abdomen soft, appropriately tender, nondistended Incisions clean dry and intact with Dermabond  Disposition: Discharge disposition: 01-Home or Self Care       Discharge Instructions    Discharge instructions   Complete by: As directed    You underwent laparoscopic removal of the left kidney for recurrent kidney infection.  No strenuous activity or heavy lifting more than 15 to 20 pounds for the next 4 weeks.  Continue to wear your abdominal binder when active. OK to shower, no tub baths or swimming for 2-3 weeks.  Walk daily, stairs are okay, continue to use your incentive spirometer to prevent pneumonia.  Drink plenty of fluids and eat small healthy meals.  Avoid heavy meals and fried food for the next few weeks.  Take Tylenol for pain, and the narcotic oxycodone for any severe pain.  Be cautious as this can cause constipation.  Avoid ibuprofen and Aleve and high doses or for multiple days in a row with your solitary kidney.  If you have  fever over 101.3 or uncontrolled pain or nausea call the clinic or present to the ED.  We will arrange clinic follow-up in 1 to 2 weeks to check in and review pathology results.   Discharge patient   Complete by: As directed    Patient can discharge this afternoon if tolerates lunch, thanks   Discharge disposition: 01-Home or Self Care   Discharge patient date: 04/07/2020     Allergies as of 04/07/2020      Reactions   Penicillins Rash   14 years ago   Macrobid [nitrofurantoin] Nausea And Vomiting      Medication List    STOP taking these medications   celecoxib 200 MG capsule Commonly known as: CeleBREX   ciprofloxacin 500 MG tablet Commonly known as: CIPRO   promethazine 25 MG tablet Commonly known as: PHENERGAN     TAKE these medications   atorvastatin 40 MG tablet Commonly known as: LIPITOR Take 40 mg by mouth.   docusate sodium 100 MG capsule Commonly known as: COLACE Take 1 capsule (100 mg total) by mouth 2 (two) times daily.   HumuLIN 70/30 KwikPen (70-30) 100 UNIT/ML KwikPen Generic drug: insulin isophane & regular human Inject 40 Units into the skin 2 (two) times daily with a meal.   losartan 50 MG tablet Commonly known as: COZAAR Take 1 tablet (50 mg total) by mouth daily.   metFORMIN 500 MG tablet Commonly known as: GLUCOPHAGE Take 500 mg by mouth 2 (two) times daily.   multivitamin with minerals tablet Take 1 tablet by mouth daily.   ondansetron 4 MG tablet Commonly known as: Zofran Take 1 tablet (4  mg total) by mouth every 8 (eight) hours as needed for up to 3 days for nausea.   oxyCODONE 5 MG immediate release tablet Commonly known as: Oxy IR/ROXICODONE Take 1-2 tablets (5-10 mg total) by mouth every 4 (four) hours as needed for up to 7 days for moderate pain or severe pain.   topiramate 25 MG tablet Commonly known as: TOPAMAX Take 100 mg by mouth 2 (two) times daily. 4 Tablet(s) By Mouth Twice Daily        Signed: [name] 04/07/2020, 8:21 AM

## 2020-04-07 NOTE — Discharge Instructions (Signed)
Pyelonephritis, Adult    Pyelonephritis is an infection that occurs in the kidney. The kidneys are organs that help clean the blood by moving waste out of the blood and into the pee (urine). This infection can happen quickly, or it can last for a long time. In most cases, it clears up with treatment and does not cause other problems.  What are the causes?  This condition may be caused by:  · Germs (bacteria) going from the bladder up to the kidney. This may happen after having a bladder infection.  · Germs going from the blood to the kidney.  What increases the risk?  This condition is more likely to develop in:  · Pregnant women.  · Older people.  · People who have any of these conditions:  ? Diabetes.  ? Inflammation of the prostate gland (prostatitis), in males.  ? Kidney stones or bladder stones.  ? Other problems with the kidney or the parts of your body that carry pee from the kidneys to the bladder (ureters).  ? Cancer.  · People who have a small, thin tube (catheter) placed in the bladder.  · People who are sexually active.  · Women who use a medicine that kills sperm (spermicide) to prevent pregnancy.  · People who have had a prior urinary tract infection (UTI).  What are the signs or symptoms?  Symptoms of this condition include:  · Peeing often.  · A strong urge to pee right away.  · Burning or stinging when peeing.  · Belly pain.  · Back pain.  · Pain in the side (flank area).  · Fever or chills.  · Blood in the pee, or dark pee.  · Feeling sick to your stomach (nauseous) or throwing up (vomiting).  How is this treated?  This condition may be treated by:  · Taking antibiotic medicines by mouth (orally).  · Drinking enough fluids.  If the infection is bad, you may need to stay in the hospital. You may be given antibiotics and fluids that are put directly into a vein through an IV tube.  In some cases, other treatments may be needed.  Follow these instructions at home:  Medicines  · Take your antibiotic  medicine as told by your doctor. Do not stop taking the antibiotic even if you start to feel better.  · Take over-the-counter and prescription medicines only as told by your doctor.  General instructions    · Drink enough fluid to keep your pee pale yellow.  · Avoid caffeine, tea, and carbonated drinks.  · Pee (urinate) often. Avoid holding in pee for long periods of time.  · Pee before and after sex.  · After pooping (having a bowel movement), women should wipe from front to back. Use each tissue only once.  · Keep all follow-up visits as told by your doctor. This is important.  Contact a doctor if:  · You do not feel better after 2 days.  · Your symptoms get worse.  · You have a fever.  Get help right away if:  · You cannot take your medicine or drink fluids as told.  · You have chills and shaking.  · You throw up.  · You have very bad pain in your side or back.  · You feel very weak or you pass out (faint).  Summary  · Pyelonephritis is an infection that occurs in the kidney.  · In most cases, this infection clears up with treatment and does   not cause other problems.  · Take your antibiotic medicine as told by your doctor. Do not stop taking the antibiotic even if you start to feel better.  · Drink enough fluid to keep your pee pale yellow.  This information is not intended to replace advice given to you by your health care provider. Make sure you discuss any questions you have with your health care provider.  Document Revised: 11/01/2017 Document Reviewed: 11/01/2017  Elsevier Patient Education © 2021 [employer]

## 2020-04-07 NOTE — Progress Notes (Addendum)
Patient given instructions to follow up with Urology Clinic, when to return for worsening symptoms, IVs discontinued, medication education given, & patient's husband given work excuse. Patient awaiting wheelchair. MD aware of patient's blood pressure and okay to be discharged.

## 2020-04-08 LAB — SURGICAL PATHOLOGY

## 2020-04-11 ENCOUNTER — Telehealth: Payer: Self-pay | Admitting: Urology

## 2020-04-11 NOTE — Telephone Encounter (Signed)
Pt. Called to see about her post -op visit from surgery last week.  Please call.

## 2020-04-11 NOTE — Telephone Encounter (Signed)
Pt scheduled 4/21 @ 9

## 2020-04-23 ENCOUNTER — Ambulatory Visit (INDEPENDENT_AMBULATORY_CARE_PROVIDER_SITE_OTHER): Payer: Medicaid Other | Admitting: Urology

## 2020-04-23 ENCOUNTER — Other Ambulatory Visit: Payer: Self-pay

## 2020-04-23 ENCOUNTER — Encounter: Payer: Self-pay | Admitting: Urology

## 2020-04-23 VITALS — BP 113/77 | HR 108 | Ht 66.0 in | Wt 217.4 lb

## 2020-04-23 DIAGNOSIS — N118 Other chronic tubulo-interstitial nephritis: Secondary | ICD-10-CM

## 2020-04-23 NOTE — Progress Notes (Signed)
   04/23/2020 10:34 AM   Katelyn Barnes January 26, 1980 [id number]  Reason for visit: Follow up left nephrectomy  HPI: 40 year old female with recurrent left pyelonephritis and abscesses despite multiple courses of long-term antibiotics and percutaneous drains who ultimately had a renal scan showing only 10% function in a atrophic left kidney and opted for nephrectomy.  She underwent a left hand-assisted laparoscopic nephrectomy on 04/04/2020, and pathology showed a chronically infected kidney.  She has been off antibiotics since discharge and doing well.  She feels much better than prior to nephrectomy.  Her incisions are well-healed on exam.  She has some mild left lower quadrant tenderness, but no palpable lesions or skin abnormalities.  She is tolerating a general diet and having normal bowel movements.  She still has some mild constipation, and we discussed over-the-counter strategies to counter this, including suppositories.  She is not taking any narcotic pain medications at this time.  We discussed precautions with her solitary kidney and avoiding NSAIDs and other nephrotoxic medications as well as adequate hydration.  Return precautions discussed.  RTC 6 months with BMP prior.   [name], MD  [facility] [address] [place], [place] [address] [phone]

## 2020-05-01 ENCOUNTER — Ambulatory Visit: Payer: Medicaid Other | Admitting: Urology

## 2020-05-05 NOTE — Telephone Encounter (Signed)
That would be fine 

## 2020-05-12 NOTE — Telephone Encounter (Signed)
[name], MD  You 23 minutes ago (9:25 AM)     Could just be a GI bug but offer her a visit with me this week.

## 2020-05-12 NOTE — Telephone Encounter (Signed)
.  left message to have patient return my call, also sent [facility] message.

## 2020-05-13 ENCOUNTER — Other Ambulatory Visit: Payer: Self-pay

## 2020-05-13 ENCOUNTER — Encounter: Payer: Self-pay | Admitting: Urology

## 2020-05-13 ENCOUNTER — Other Ambulatory Visit
Admission: RE | Admit: 2020-05-13 | Discharge: 2020-05-13 | Disposition: A | Discharge status: Home / Self Care | Payer: Medicaid Other | Attending: Urology | Admitting: Urology

## 2020-05-13 ENCOUNTER — Ambulatory Visit (INDEPENDENT_AMBULATORY_CARE_PROVIDER_SITE_OTHER): Payer: Medicaid Other | Admitting: Urology

## 2020-05-13 ENCOUNTER — Other Ambulatory Visit: Payer: Self-pay | Admitting: *Deleted

## 2020-05-13 VITALS — BP 118/79 | HR 82 | Ht 66.0 in | Wt 229.0 lb

## 2020-05-13 DIAGNOSIS — R109 Unspecified abdominal pain: Secondary | ICD-10-CM | POA: Diagnosis present

## 2020-05-13 DIAGNOSIS — N12 Tubulo-interstitial nephritis, not specified as acute or chronic: Secondary | ICD-10-CM

## 2020-05-13 DIAGNOSIS — R10A0 Flank pain, unspecified side: Secondary | ICD-10-CM

## 2020-05-13 LAB — URINALYSIS, COMPLETE (UACMP) WITH MICROSCOPIC
Bilirubin Urine: NEGATIVE
Glucose, UA: NEGATIVE mg/dL
Hgb urine dipstick: NEGATIVE
Ketones, ur: NEGATIVE mg/dL
Leukocytes,Ua: NEGATIVE
Nitrite: NEGATIVE
Protein, ur: NEGATIVE mg/dL
Specific Gravity, Urine: 1.01 (ref 1.005–1.030)
pH: 6.5 (ref 5.0–8.0)

## 2020-05-13 NOTE — Addendum Note (Signed)
Addended by: Chauncey Cruel F on: 05/13/2020 11:18 AM   Modules accepted: Orders

## 2020-05-13 NOTE — Progress Notes (Signed)
   05/13/2020 11:45 AM   Katelyn Barnes 10-02-1980 [id number]  Reason for visit: Left flank pain  HPI: 40 year old female with complex urologic history including recurrent left pyelonephritis and abscesses, with NM renal scan ultimately showing only 10% function and she underwent a uncomplicated left hand-assisted laparoscopic simple nephrectomy on 04/04/2020 after failing multiple courses of antibiotics and percutaneous drainage.  Pathology showed a chronically infected kidney.  At our last visit on 04/23/2020 she really was doing very well with no significant pain or nausea and feeling better.  She had a 9-hour drive to [place] for a wedding last weekend, and started to have some left-sided low back pain when she arrived.  She also had diarrhea, nausea, and left-sided flank pain similar to her prior episodes of renal abscess.  She denies any fevers, chills, or urinary symptoms.  Urinalysis today is benign.  On exam she has some mild left CVA tenderness.  Vitals today benign.  We discussed possibilities at length including musculoskeletal from long car ride, viral GI illness, food poisoning, diverticulitis, or retroperitoneal abscess.  We discussed options at length including CT scan versus observation for the next few days to see if she improves.  We discussed return precautions extensively.  She would like to see how she feels over the next few days, and consider CT scan in the next 1 to 2 weeks if not improving.  I think this is very reasonable.  RTC virtual visit 1 week symptom check Consider CT abdomen pelvis with contrast if worsening symptoms or fever  [name], MD  [facility] [address] [place], [place] [address] [phone]

## 2020-05-20 ENCOUNTER — Telehealth (INDEPENDENT_AMBULATORY_CARE_PROVIDER_SITE_OTHER): Payer: Medicaid Other | Admitting: Urology

## 2020-05-20 ENCOUNTER — Other Ambulatory Visit: Payer: Self-pay

## 2020-05-20 DIAGNOSIS — N118 Other chronic tubulo-interstitial nephritis: Secondary | ICD-10-CM

## 2020-05-20 NOTE — Progress Notes (Signed)
Virtual Visit via Telephone Note  I connected with Katelyn Barnes on 05/20/20 at 11:30 AM EDT by telephone and verified that I am speaking with the correct person using two identifiers.   Patient location: Home Provider location: [facility]   I discussed the limitations, risks, security and privacy concerns of performing an evaluation and management service by telephone and the availability of in person appointments. We discussed the impact of the COVID-19 pandemic on the healthcare system, and the importance of social distancing and reducing patient and provider exposure. I also discussed with the patient that there may be a patient responsible charge related to this service. The patient expressed understanding and agreed to proceed.  Reason for visit: History of left XGP kidney  History of Present Illness: 40 year old female with complex urologic history including recurrent left pyelonephritis and abscesses, with NM renal scan ultimately showing only 10% function and she underwent a uncomplicated left hand-assisted laparoscopic simple nephrectomy on 04/04/2020 after failing multiple courses of antibiotics and percutaneous drainage.  Pathology showed a chronically infected kidney.  At our first postop visit on 04/23/2020 she really was doing very well with no significant pain or nausea and feeling better.  She was added to my schedule last week on 05/13/2020 for left-sided flank pain, abdominal pain, nausea, and diarrhea.  She had recently had a 9-hour drive to [place] for a wedding and the pain started after that trip.  Urinalysis was benign at that visit.  We opted for trial of observation with close follow-up for virtual visit today.  She has been feeling much better since then.  She denies any flank pain, fevers, or chills.  Her diarrhea has almost completely resolved.  She denies any nausea or vomiting.  Overall she feels very well.  We discussed return precautions  extensively.  We discussed possible etiologies including viral illness, musculoskeletal pain from long car ride, food poisoning, bowel obstruction, or retroperitoneal abscess.  She had a 9-hour drive to [place] for a wedding last weekend, and started to have some left-sided low back pain when she arrived.  She also had diarrhea, nausea, and left-sided flank pain similar to her prior episodes of renal abscess.  She denies any fevers, chills, or urinary symptoms.  RTC 6 months symptom check   I discussed the assessment and treatment plan with the patient. The patient was provided an opportunity to ask questions and all were answered. The patient agreed with the plan and demonstrated an understanding of the instructions.   The patient was advised to call back or seek an in-person evaluation if the symptoms worsen or if the condition fails to improve as anticipated.   [name], MD

## 2020-06-18 ENCOUNTER — Encounter: Payer: Self-pay | Admitting: Emergency Medicine

## 2020-06-18 ENCOUNTER — Emergency Department: Payer: Medicaid Other

## 2020-06-18 ENCOUNTER — Emergency Department
Admission: EM | Admit: 2020-06-18 | Discharge: 2020-06-18 | Disposition: A | Discharge status: Home / Self Care | Payer: Medicaid Other | Attending: Emergency Medicine | Admitting: Emergency Medicine

## 2020-06-18 ENCOUNTER — Other Ambulatory Visit: Payer: Self-pay

## 2020-06-18 DIAGNOSIS — M792 Neuralgia and neuritis, unspecified: Secondary | ICD-10-CM

## 2020-06-18 DIAGNOSIS — I10 Essential (primary) hypertension: Secondary | ICD-10-CM | POA: Diagnosis not present

## 2020-06-18 DIAGNOSIS — Z79899 Other long term (current) drug therapy: Secondary | ICD-10-CM | POA: Insufficient documentation

## 2020-06-18 DIAGNOSIS — Z7984 Long term (current) use of oral hypoglycemic drugs: Secondary | ICD-10-CM | POA: Insufficient documentation

## 2020-06-18 DIAGNOSIS — E1140 Type 2 diabetes mellitus with diabetic neuropathy, unspecified: Secondary | ICD-10-CM | POA: Diagnosis not present

## 2020-06-18 DIAGNOSIS — R202 Paresthesia of skin: Secondary | ICD-10-CM | POA: Diagnosis not present

## 2020-06-18 DIAGNOSIS — M7989 Other specified soft tissue disorders: Secondary | ICD-10-CM | POA: Insufficient documentation

## 2020-06-18 DIAGNOSIS — Z8616 Personal history of COVID-19: Secondary | ICD-10-CM | POA: Diagnosis not present

## 2020-06-18 DIAGNOSIS — Z794 Long term (current) use of insulin: Secondary | ICD-10-CM | POA: Insufficient documentation

## 2020-06-18 MED ORDER — HYDROCODONE-ACETAMINOPHEN 5-325 MG PO TABS: 1.0000 | ORAL_TABLET | Freq: Four times a day (QID) | ORAL | 0 refills | Status: DC | PRN

## 2020-06-18 MED ORDER — METHYLPREDNISOLONE 4 MG PO TBPK: ORAL_TABLET | ORAL | 0 refills | Status: DC

## 2020-06-18 NOTE — ED Provider Notes (Signed)
[facility] Emergency Department Provider Note  ____________________________________________   Event Date/Time   First MD Initiated Contact with Patient 06/18/20 1135     (approximate)  I have reviewed the triage vital signs and the nursing notes.   HISTORY  Chief Complaint Leg Swelling    HPI Katelyn Barnes is a 40 y.o. female here with left leg pain and swelling, mild right leg pain.  The patient states that she had COVID several weeks ago.  Over the last week, she has had intermittent, sharp, tingling, somewhat atypical pain along the anterior aspect of her left leg.  She said some mild symptoms in the right leg.  She also has had some left ankle swelling.  Denies any preceding injuries.  No back pain, that she did but she does have a history of degenerative disc disease.  Symptoms are worse with even light touch along the left anterolateral lower leg.  No leg swelling.  No recent mobilization.  Her DVT was provoked.  She is no longer on blood thinners.        Past Medical History:  Diagnosis Date  . Anemia    iron transfusions x 2  . COVID-19   . DDD (degenerative disc disease), lumbar   . Diabetes mellitus without complication (HCC)    Type II  . DVT (deep venous thrombosis) (HCC) 2016   post back surgery - was on Eliquis 3 months  . Kidney stone     Patient Active Problem List   Diagnosis Date Noted  . 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 Renal abscess 09/05/2019  . Left flank pain   . Diabetes mellitus (HCC)   . Essential hypertension   . Iron deficiency 08/09/2019  . Hyperglycemia   . Acute respiratory failure (HCC)   . Sepsis (HCC) 06/11/2019  . Acute renal failure (ARF) (HCC) 06/11/2019  . Pyelonephritis 06/11/2019  . DKA (diabetic ketoacidoses) 06/11/2019  . Obesity hypoventilation syndrome (HCC) 06/11/2019  . Severe sepsis with acute organ dysfunction (HCC) 06/11/2019  . Radiculopathy 05/05/2016  .  Morbid obesity with BMI of 40.0-44.9, adult (HCC) 06/27/2014  . 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 Diabetic neuropathy (HCC) 11/16/2012  . PCOS (polycystic ovarian syndrome) 11/16/2012  . Bilateral carpal tunnel syndrome 09/12/2012  . Right anterior knee pain 12/27/2011  . Diabetes mellitus type 2, uncontrolled (HCC) 06/10/2011  . Menorrhagia 06/10/2011  . Peripheral edema 06/10/2011  . Sciatica 04/27/2011  . Dysthymic disorder 08/12/2010  . Generalized anxiety disorder 08/12/2010    Past Surgical History:  Procedure Laterality Date  . ANTERIOR CERVICAL DECOMP/DISCECTOMY FUSION Right 05/05/2016   Procedure: ANTERIOR CERVICAL DECOMPRESSION FUSION, CERVICAL 7- THORACIC 1 WITH INSTRUMENTATION AND ALLOGRAFT;  Surgeon: Estill Bamberg, MD;  Location: [facility] OR;  Service: Orthopedics;  Laterality: Right;  ANTERIOR CERVICAL DECOMPRESSION FUSION, CERVICAL 7- THORACIC 1 WITH INSTRUMENTATION AND ALLOGRAFT  . BACK SURGERY  2016   discectomy   . CARPAL TUNNEL RELEASE Bilateral   . CYSTOSCOPY W/ RETROGRADES Left 07/06/2019   Procedure: CYSTOSCOPY WITH RETROGRADE PYELOGRAM;  Surgeon: [name], MD;  Location: [facility] ORS;  Service: Urology;  Laterality: Left;  . CYSTOSCOPY WITH URETEROSCOPY AND STENT PLACEMENT Left 06/11/2019   Procedure: CYSTOSCOPY WITH URETEROSCOPY AND STENT PLACEMENT;  Surgeon: Jerilee Field, MD;  Location: [facility] ORS;  Service: Urology;  Laterality: Left;  . CYSTOSCOPY WITH URETEROSCOPY AND STENT PLACEMENT Left 07/06/2019   Procedure: CYSTOSCOPY WITH URETEROSCOPY AND STENT exchange;  Surgeon: [name], MD;  Location: [facility] ORS;  Service: Urology;  Laterality: Left;  . IR NEPHROSTOMY EXCHANGE LEFT  10/19/2019  . LAPAROSCOPIC NEPHRECTOMY, HAND ASSISTED Left 04/04/2020   Procedure: HAND ASSISTED LAPAROSCOPIC NEPHRECTOMY;  Surgeon: Sondra ComeSninsky, Brian C, MD;  Location: [facility] ORS;  Service: Urology;  Laterality: Left;  . SHOULDER ARTHROSCOPY    . TUBAL LIGATION   08/2007    Prior to Admission medications   Medication Sig Start Date End Date Taking? Authorizing Provider  HYDROcodone-acetaminophen (NORCO/VICODIN) 5-325 MG tablet Take 1-2 tablets by mouth every 6 (six) hours as needed for moderate pain or severe pain. 06/18/20 06/18/21 Yes Shaune PollackIsaacs, Raylea Adcox, MD  methylPREDNISolone (MEDROL DOSEPAK) 4 MG TBPK tablet Take as directed 06/18/20  Yes Shaune PollackIsaacs, Stefanny Pieri, MD  atorvastatin (LIPITOR) 40 MG tablet Take 40 mg by mouth.  07/10/19   [provider]  docusate sodium (COLACE) 100 MG capsule Take 1 capsule (100 mg total) by mouth 2 (two) times daily. 04/07/20   Sondra ComeSninsky, Brian C, MD  insulin isophane & regular human (HUMULIN 70/30 KWIKPEN) (70-30) 100 UNIT/ML KwikPen Inject 40 Units into the skin 2 (two) times daily with a meal.  07/10/19 07/09/20  [provider]  losartan (COZAAR) 50 MG tablet Take 1 tablet (50 mg total) by mouth daily. 09/13/19   Shahmehdi, Gemma PayorSeyed A, MD  metFORMIN (GLUCOPHAGE) 500 MG tablet Take 500 mg by mouth 2 (two) times daily. 08/04/19   [provider]  Multiple Vitamins-Minerals (MULTIVITAMIN WITH MINERALS) tablet Take 1 tablet by mouth daily.    [provider]  topiramate (TOPAMAX) 25 MG tablet Take 100 mg by mouth 2 (two) times daily. 4 Tablet(s) By Mouth Twice Daily 07/10/19 07/09/20  [provider]    Allergies Penicillins and Macrobid [nitrofurantoin]  History reviewed. No pertinent family history.  Social History Social History   Tobacco Use  . Smoking status: Never Smoker  . Smokeless tobacco: Never Used  Vaping Use  . Vaping Use: Never used  Substance Use Topics  . Alcohol use: No  . Drug use: No    Review of Systems  Review of Systems  Constitutional: Negative for chills and fever.  HENT: Negative for sore throat.   Respiratory: Negative for shortness of breath.   Cardiovascular: Positive for leg swelling. Negative for chest pain.  Gastrointestinal: Negative for abdominal pain.   Genitourinary: Negative for flank pain.  Musculoskeletal: Negative for neck pain.  Skin: Negative for rash and wound.  Allergic/Immunologic: Negative for immunocompromised state.  Neurological: Positive for numbness. Negative for weakness.  Hematological: Does not bruise/bleed easily.  All other systems reviewed and are negative.    ____________________________________________  PHYSICAL EXAM:      VITAL SIGNS: ED Triage Vitals  Enc Vitals Group     BP 06/18/20 1114 117/72     Pulse Rate 06/18/20 1114 86     Resp 06/18/20 1114 17     Temp 06/18/20 1114 98.4 F (36.9 C)     Temp Source 06/18/20 1114 Oral     SpO2 06/18/20 1114 100 %     Weight 06/18/20 1112 220 lb (99.8 kg)     Height 06/18/20 1112 5\' 6"  (1.676 m)     Head Circumference --      Peak Flow --      Pain Score 06/18/20 1112 1     Pain Loc --      Pain Edu? --      Excl. in GC? --      Physical Exam Vitals  and nursing note reviewed.  Constitutional:      General: She is not in acute distress.    Appearance: She is well-developed.  HENT:     Head: Normocephalic and atraumatic.  Eyes:     Conjunctiva/sclera: Conjunctivae normal.  Cardiovascular:     Rate and Rhythm: Normal rate and regular rhythm.     Heart sounds: Normal heart sounds.  Pulmonary:     Effort: Pulmonary effort is normal. No respiratory distress.     Breath sounds: No wheezing.  Abdominal:     General: There is no distension.  Musculoskeletal:     Cervical back: Neck supple.  Skin:    General: Skin is warm.     Capillary Refill: Capillary refill takes less than 2 seconds.     Findings: No rash.  Neurological:     Mental Status: She is alert and oriented to person, place, and time.     Motor: No abnormal muscle tone.      LOWER EXTREMITY EXAM: Bilateral  INSPECTION & PALPATION: Mild allodynia and tenderness along the left anterolateral lower leg.  Mild subjective swelling in the left ankle without any bony tenderness.  No  popliteal or generalized leg edema or asymmetry.  SENSORY: sensation is intact to light touch in:  Superficial peroneal nerve distribution (over dorsum of foot) Deep peroneal nerve distribution (over first dorsal web space) Sural nerve distribution (over lateral aspect 5th metatarsal) Saphenous nerve distribution (over medial instep)  MOTOR:  + Motor EHL (great toe dorsiflexion) + FHL (great toe plantar flexion)  + TA (ankle dorsiflexion)  + GSC (ankle plantar flexion)  VASCULAR: 2+ dorsalis pedis and posterior tibialis pulses Capillary refill < 2 sec, toes warm and well-perfused  COMPARTMENTS: Soft, warm, well-perfused No pain with passive extension No parethesias   ____________________________________________   LABS (all labs ordered are listed, but only abnormal results are displayed)  Labs Reviewed - No data to display  ____________________________________________  EKG:  ________________________________________  RADIOLOGY All imaging, including plain films, CT scans, and ultrasounds, independently reviewed by me, and interpretations confirmed via formal radiology reads.  ED MD interpretation:     Official radiology report(s): US Venous Img Lower Unilateral Left  Result Date: 06/18/2020 CLINICAL DATA:  Left lower extremity edema for the past 2 days EXAM: LEFT LOWER EXTREMITY VENOUS DOPPLER ULTRASOUND TECHNIQUE: Gray-scale sonography with compression, as well as color and duplex ultrasound, were performed to evaluate the deep venous system(s) from the level of the common femoral vein through the popliteal and proximal calf veins. COMPARISON:  None. FINDINGS: VENOUS Normal compressibility of the common femoral, superficial femoral, and popliteal veins, as well as the visualized calf veins. Visualized portions of profunda femoral vein and great saphenous vein unremarkable. No filling defects to suggest DVT on grayscale or color Doppler imaging. Doppler waveforms show  normal direction of venous flow, normal respiratory plasticity and response to augmentation. Limited views of the contralateral common femoral vein are unremarkable. OTHER None. Limitations: none IMPRESSION: Negative. Electronically Signed   By: Malachy Moan M.D.   On: 06/18/2020 12:22    ____________________________________________  PROCEDURES   Procedure(s) performed (including Critical Care):  Procedures  ____________________________________________  INITIAL IMPRESSION / MDM / ASSESSMENT AND PLAN / ED COURSE  As part of my medical decision making, I reviewed the following data within the electronic MEDICAL RECORD NUMBER Nursing notes reviewed and incorporated, Old chart reviewed, Notes from prior ED visits, and [place] Controlled Substance Database       *  Takerra Pema Thomure was evaluated in Emergency Department on 06/18/2020 for the symptoms described in the history of present illness. She was evaluated in the context of the global COVID-19 pandemic, which necessitated consideration that the patient might be at risk for infection with the SARS-CoV-2 virus that causes COVID-19. Institutional protocols and algorithms that pertain to the evaluation of patients at risk for COVID-19 are in a state of rapid change based on information released by regulatory bodies including the CDC and federal and state organizations. These policies and algorithms were followed during the patient's care in the ED.  Some ED evaluations and interventions may be delayed as a result of limited staffing during the pandemic.*     Medical Decision Making: Pleasant 40 year old female here with atypical left anterolateral lower leg pain.  Pain is more consistent with likely neuropathy versus paresthesias from a lumbar disc herniation and she has a history of this.  This could have been exacerbated due to coughing with her recent COVID illness.  She has no evidence to suggest stroke.  Regarding the swelling, this is localized  mildly around the ankle without any other leg edema and ultrasound is negative for DVT.  She has intact distal strength and sensation.  Differential also includes diabetic neuropathy worsened in the setting of her recent illness though her A1c is very well controlled.  She is unable to take NSAIDs due to history of kidney disease, will treat with brief course of analgesia and steroids.  Return precautions given.  ____________________________________________  FINAL CLINICAL IMPRESSION(S) / ED DIAGNOSES  Final diagnoses:  Neuropathic pain  Paresthesia     MEDICATIONS GIVEN DURING THIS VISIT:  Medications - No data to display   ED Discharge Orders         Ordered    methylPREDNISolone (MEDROL DOSEPAK) 4 MG TBPK tablet        06/18/20 1307    HYDROcodone-acetaminophen (NORCO/VICODIN) 5-325 MG tablet  Every 6 hours PRN        06/18/20 1307           Note:  This document was prepared using Dragon voice recognition software and may include unintentional dictation errors.   Shaune Pollack, MD 06/18/20 1323

## 2020-06-18 NOTE — ED Triage Notes (Signed)
Pt comes into the ED via POV and was sent by her PCP to r/o DVT.  Pt has left leg and foot swelling and tenderness to bilateral legs.  Pt has h/o DVT in the right leg.  Pt ambulatory to triage at this time and in NAD with even and unlabored respirations.  Pt denies any SHOB, dizziness, or chest pain.

## 2020-07-11 ENCOUNTER — Other Ambulatory Visit: Payer: Medicaid Other

## 2020-07-15 ENCOUNTER — Other Ambulatory Visit: Payer: Self-pay

## 2020-07-15 DIAGNOSIS — Z7984 Long term (current) use of oral hypoglycemic drugs: Secondary | ICD-10-CM | POA: Diagnosis not present

## 2020-07-15 DIAGNOSIS — E1140 Type 2 diabetes mellitus with diabetic neuropathy, unspecified: Secondary | ICD-10-CM | POA: Insufficient documentation

## 2020-07-15 DIAGNOSIS — Z794 Long term (current) use of insulin: Secondary | ICD-10-CM | POA: Diagnosis not present

## 2020-07-15 DIAGNOSIS — R42 Dizziness and giddiness: Secondary | ICD-10-CM | POA: Diagnosis present

## 2020-07-15 DIAGNOSIS — Z79899 Other long term (current) drug therapy: Secondary | ICD-10-CM | POA: Diagnosis not present

## 2020-07-15 DIAGNOSIS — I10 Essential (primary) hypertension: Secondary | ICD-10-CM | POA: Insufficient documentation

## 2020-07-15 DIAGNOSIS — Z8616 Personal history of COVID-19: Secondary | ICD-10-CM | POA: Insufficient documentation

## 2020-07-15 NOTE — ED Triage Notes (Signed)
Pt arrives with c/o dizziness since this morning. Worse when she looks up and down. Denies pain. Face symmetric, equal grips, clear speech.

## 2020-07-16 ENCOUNTER — Emergency Department: Payer: Medicaid Other

## 2020-07-16 ENCOUNTER — Ambulatory Visit: Payer: Medicaid Other | Admitting: Urology

## 2020-07-16 ENCOUNTER — Emergency Department
Admission: EM | Admit: 2020-07-16 | Discharge: 2020-07-16 | Disposition: A | Discharge status: Home / Self Care | Payer: Medicaid Other | Attending: Emergency Medicine | Admitting: Emergency Medicine

## 2020-07-16 DIAGNOSIS — R42 Dizziness and giddiness: Secondary | ICD-10-CM

## 2020-07-16 LAB — CBC
HCT: 34.4 % — ABNORMAL LOW (ref 36.0–46.0)
Hemoglobin: 11.3 g/dL — ABNORMAL LOW (ref 12.0–15.0)
MCH: 29.6 pg (ref 26.0–34.0)
MCHC: 32.8 g/dL (ref 30.0–36.0)
MCV: 90.1 fL (ref 80.0–100.0)
Platelets: 150 10*3/uL (ref 150–400)
RBC: 3.82 MIL/uL — ABNORMAL LOW (ref 3.87–5.11)
RDW: 14 % (ref 11.5–15.5)
WBC: 6.5 10*3/uL (ref 4.0–10.5)
nRBC: 0 % (ref 0.0–0.2)

## 2020-07-16 LAB — BASIC METABOLIC PANEL
Anion gap: 4 — ABNORMAL LOW (ref 5–15)
BUN: 24 mg/dL — ABNORMAL HIGH (ref 6–20)
CO2: 21 mmol/L — ABNORMAL LOW (ref 22–32)
Calcium: 9.2 mg/dL (ref 8.9–10.3)
Chloride: 116 mmol/L — ABNORMAL HIGH (ref 98–111)
Creatinine, Ser: 1.07 mg/dL — ABNORMAL HIGH (ref 0.44–1.00)
GFR, Estimated: >60 mL/min (ref >60)
Glucose, Bld: 72 mg/dL (ref 70–99)
Potassium: 3.5 mmol/L (ref 3.5–5.1)
Sodium: 141 mmol/L (ref 135–145)

## 2020-07-16 LAB — URINALYSIS, COMPLETE (UACMP) WITH MICROSCOPIC
Bacteria, UA: NONE SEEN
Bilirubin Urine: NEGATIVE
Glucose, UA: NEGATIVE mg/dL
Hgb urine dipstick: NEGATIVE
Ketones, ur: NEGATIVE mg/dL
Leukocytes,Ua: NEGATIVE
Nitrite: NEGATIVE
Protein, ur: 30 mg/dL — AB
Specific Gravity, Urine: 1.011 (ref 1.005–1.030)
pH: 6 (ref 5.0–8.0)

## 2020-07-16 LAB — POC URINE PREG, ED: Preg Test, Ur: NEGATIVE

## 2020-07-16 MED ORDER — MECLIZINE HCL 25 MG PO TABS
25.0000 mg | ORAL_TABLET | Freq: Once | ORAL | Status: AC
  Administered 2020-07-16: 25 mg via ORAL
  Filled 2020-07-16: qty 1

## 2020-07-16 MED ORDER — LACTATED RINGERS IV BOLUS
1000.0000 mL | Freq: Once | INTRAVENOUS | Status: AC
  Administered 2020-07-16: 1000 mL via INTRAVENOUS

## 2020-07-16 MED ORDER — MECLIZINE HCL 25 MG PO TABS: 25.0000 mg | ORAL_TABLET | Freq: Three times a day (TID) | ORAL | 0 refills | Status: AC | PRN

## 2020-07-16 NOTE — ED Provider Notes (Signed)
[facility] Emergency Department Provider Note ____________________________________________   Event Date/Time   First MD Initiated Contact with Patient 07/16/20 [phone]     (approximate)  I have reviewed the triage vital signs and the nursing notes.  HISTORY  Chief Complaint Dizziness   HPI Katelyn Barnes is a 40 y.o. femalewho presents to the ED for evaluation of dizziness.   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   She presents today to the ED for evaluation of lightheaded dizziness over the past 1 day.  She denies any associated symptoms beyond presyncopal dizziness.  She reports her symptoms are worse with movement, worse with extraocular movements upward or downward.  She denies any fevers, syncopal episodes, emesis or nausea, headache or pain anywhere, falls or trauma.   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   Renal abscess 09/05/2019   Left flank pain    Diabetes mellitus (HCC)    Essential hypertension    Iron deficiency 08/09/2019   Hyperglycemia    Acute respiratory failure (HCC)    Sepsis (HCC) 06/11/2019   Acute renal failure (ARF) (HCC) 06/11/2019   Pyelonephritis 06/11/2019   DKA (diabetic ketoacidoses) 06/11/2019   Obesity hypoventilation syndrome (HCC) 06/11/2019   Severe sepsis with acute organ dysfunction (HCC) 06/11/2019   Radiculopathy 05/05/2016   Morbid obesity with BMI of 40.0-44.9, adult (HCC) 06/27/2014   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   Diabetic neuropathy (HCC)  11/16/2012   PCOS (polycystic ovarian syndrome) 11/16/2012   Bilateral carpal tunnel syndrome 09/12/2012   Right anterior knee pain 12/27/2011   Diabetes mellitus type 2, uncontrolled (HCC) 06/10/2011   Menorrhagia 06/10/2011   Peripheral edema 06/10/2011   Sciatica 04/27/2011   Dysthymic disorder 08/12/2010   Generalized anxiety disorder 08/12/2010    Past Surgical History:  Procedure Laterality Date   ANTERIOR CERVICAL DECOMP/DISCECTOMY FUSION Right 05/05/2016   Procedure: ANTERIOR CERVICAL DECOMPRESSION FUSION, CERVICAL 7- THORACIC 1 WITH INSTRUMENTATION AND ALLOGRAFT;  Surgeon: Estill Bamberg, MD;  Location: [facility] OR;  Service: Orthopedics;  Laterality: Right;  ANTERIOR CERVICAL DECOMPRESSION FUSION, CERVICAL 7- THORACIC 1 WITH INSTRUMENTATION AND ALLOGRAFT   BACK SURGERY  2016   discectomy    CARPAL TUNNEL RELEASE Bilateral    CYSTOSCOPY W/ RETROGRADES Left 07/06/2019   Procedure: CYSTOSCOPY WITH RETROGRADE PYELOGRAM;  Surgeon: [name], MD;  Location: [facility] ORS;  Service: Urology;  Laterality: Left;   CYSTOSCOPY WITH URETEROSCOPY AND STENT PLACEMENT Left 06/11/2019   Procedure: CYSTOSCOPY WITH URETEROSCOPY AND STENT PLACEMENT;  Surgeon: Jerilee Field, MD;  Location: [facility] ORS;  Service: Urology;  Laterality: Left;   CYSTOSCOPY WITH URETEROSCOPY AND STENT PLACEMENT Left 07/06/2019   Procedure: CYSTOSCOPY WITH URETEROSCOPY AND STENT exchange;  Surgeon: [name], MD;  Location: [facility] ORS;  Service: Urology;  Laterality: Left;   IR NEPHROSTOMY EXCHANGE LEFT  10/19/2019   LAPAROSCOPIC NEPHRECTOMY, HAND ASSISTED Left 04/04/2020   Procedure: HAND ASSISTED LAPAROSCOPIC NEPHRECTOMY;  Surgeon: [name], MD;  Location: [facility] ORS;  Service: Urology;  Laterality: Left;   SHOULDER ARTHROSCOPY     TUBAL LIGATION  08/2007    Prior to Admission medications   Medication Sig Start Date End Date Taking? Authorizing Provider  atorvastatin (LIPITOR) 40 MG tablet Take 40 mg by mouth.   07/10/19   [provider]  docusate sodium (COLACE) 100 MG capsule Take 1 capsule (100 mg total) by mouth 2 (two) times daily. 04/07/20   [name], MD  HYDROcodone-acetaminophen (NORCO/VICODIN) 5-325 MG tablet Take 1-2 tablets by mouth every 6 (six) hours as needed for moderate pain or severe pain. 06/18/20 06/18/21  Shaune Pollack, MD  insulin isophane & regular human (HUMULIN 70/30 KWIKPEN) (70-30) 100 UNIT/ML KwikPen Inject 40 Units into the skin 2 (two) times daily with a meal.  07/10/19 07/09/20  [provider]  losartan (COZAAR) 50 MG tablet Take 1 tablet (50 mg total) by mouth daily. 09/13/19   Shahmehdi, Gemma Payor, MD  metFORMIN (GLUCOPHAGE) 500 MG tablet Take 500 mg by mouth 2 (two) times daily. 08/04/19   [provider]  methylPREDNISolone (MEDROL DOSEPAK) 4 MG TBPK tablet Take as directed 06/18/20   Shaune Pollack, MD  Multiple Vitamins-Minerals (MULTIVITAMIN WITH MINERALS) tablet Take 1 tablet by mouth daily.    [provider]    Allergies Penicillins and Macrobid [nitrofurantoin]  No family history on file.  Social History Social History   Tobacco Use   Smoking status: Never   Smokeless tobacco: Never  Vaping Use   Vaping Use: Never used  Substance Use Topics   Alcohol use: No   Drug use: No    Review of Systems  Constitutional: No fever/chills.  Positive for lightheaded dizziness Eyes: No visual changes. ENT: No sore throat. Cardiovascular: Denies chest pain. Respiratory: Denies shortness of breath. Gastrointestinal: No abdominal pain.  No nausea, no vomiting.  No diarrhea.  No constipation. Genitourinary: Negative for dysuria. Musculoskeletal: Negative for back pain. Skin: Negative for rash. Neurological: Negative for headaches, focal weakness or numbness. ____________________________________________  PHYSICAL EXAM:  VITAL SIGNS: Vitals:   07/16/20 0330 07/16/20 0628  BP: 123/74 (!) 169/85  Pulse: 68 74  Resp: (!) 22  18  Temp:    SpO2: 99% 99%    Constitutional: Alert and oriented. Well appearing and in no acute distress. Eyes: Conjunctivae are normal. PERRL. EOMI. Head: Atraumatic. Nose: No congestion/rhinnorhea. Mouth/Throat: Mucous membranes are moist.  Oropharynx non-erythematous. Neck: No stridor. No cervical spine tenderness to palpation. Cardiovascular: Normal rate, regular rhythm. Grossly normal heart sounds.  Good peripheral circulation. Respiratory: Normal respiratory effort.  No retractions. Lungs CTAB. Gastrointestinal: Soft , nondistended, nontender to palpation. No CVA tenderness. Musculoskeletal: No lower extremity tenderness nor edema.  No joint effusions. No signs of acute trauma. Neurologic:  Normal speech and language. No gross focal neurologic deficits are appreciated.  Cranial nerves II through XII intact 5/5 strength and sensation in all 4 extremities Skin:  Skin is warm, dry and intact. No rash noted. Psychiatric: Mood and affect are normal. Speech and behavior are normal. ____________________________________________   LABS (all labs ordered are listed, but only abnormal results are displayed)  Labs Reviewed  BASIC METABOLIC PANEL - Abnormal; Notable for the following components:      Result Value   Chloride 116 (*)    CO2 21 (*)    BUN 24 (*)    Creatinine, Ser 1.07 (*)    Anion gap 4 (*)    All other components within normal limits  CBC -  Abnormal; Notable for the following components:   RBC 3.82 (*)    Hemoglobin 11.3 (*)    HCT 34.4 (*)    All other components within normal limits  URINALYSIS, COMPLETE (UACMP) WITH MICROSCOPIC - Abnormal; Notable for the following components:   Color, Urine YELLOW (*)    APPearance CLEAR (*)    Protein, ur 30 (*)    All other components within normal limits  POC URINE PREG, ED   ____________________________________________  12 Lead EKG  Sinus rhythm, rate of 78 bpm.  Normal axis and intervals.  Stigmata of LVH.  No  STEMI. ____________________________________________  RADIOLOGY  ED MD interpretation:    Official radiology report(s): No results found.  ____________________________________________   PROCEDURES and INTERVENTIONS  Procedure(s) performed (including Critical Care):  .1-3 Lead EKG Interpretation  Date/Time: 07/16/2020 4:40 AM Performed by: Delton Prairie, MD Authorized by: Delton Prairie, MD     Interpretation: normal     ECG rate:  68   ECG rate assessment: normal     Rhythm: sinus rhythm     Ectopy: none     Conduction: normal    Medications  meclizine (ANTIVERT) tablet 25 mg (has no administration in time range)  lactated ringers bolus 1,000 mL (0 mLs Intravenous Stopped 07/16/20 0628)    ____________________________________________   MDM / ED COURSE   Obese 40 year old woman s/p nephrectomy presents to the ED with nonspecific dizziness of uncertain etiology.  Exam is reassuring without evidence of neurologic or vascular deficits, distress or any reproducible pathology.  Unremarkable blood work and urine.  EKG is nonischemic.  She has no resolution of symptoms after IV fluids.  Due to her unrelenting dizziness and gait instability upon my ambulatory reassessment, we will obtain MRI brain to assess for CNS pathology to cause her symptoms such as CVA.  Patient signed out to oncoming provider to follow-up on the study  Clinical Course as of 07/16/20 0717  Wed Jul 16, 2020  3716 Fluids ongoing, patient denies complaints.  [DS]  9678 Reassessed.  Patient reports persistent dizziness and expresses discomfort going home.  I stand her up and walk around the room and she was quite unsteady on her feet.  We discussed MRI of her brain and she is agreeable. [DS]    Clinical Course User Index [DS] Delton Prairie, MD    ____________________________________________   FINAL CLINICAL IMPRESSION(S) / ED DIAGNOSES  Final diagnoses:  Dizziness     ED Discharge Orders     None         Hatsumi Steinhart Katrinka Blazing   Note:  This document was prepared using Dragon voice recognition software and may include unintentional dictation errors.    Delton Prairie, MD 07/16/20 [phone]

## 2020-07-16 NOTE — ED Provider Notes (Signed)
I sent care of the patient approximately 0 700.  Please see her progress note for full details regarding patient's initial evaluation assessment.  In brief she presents for assessment of lightheadedness and dizziness over the past day.  They do seem worse with movement and movement of extraocular eye movements particularly although patient has a nonfocal exam initial reassuring labs.  Plan is to give patient IV fluids meclizine and obtain MR to rule out acute intracranial process gnosis is reassuring likely discharge with outpatient follow-up.  Orthostatics obtained while pending MRI final read.  Patient is not orthostatic.  MRI remarkable for no evidence of CVA, hemorrhage or mass but there is evidence of some nonspecific gliosis possible mild demyelination of the cerebral white matter.  Discussed patient's presentation and work-up including MRI with on-call neurologist Dr. Selina Cooley who felt that MRI findings are likely incidental and unrelated to vertigo.  Patient did receive fluids and Antivert on reassessment states she felt little better.  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  When she is feeling better with reassuring work-up and on my assessment and nonfocal supine neuro exam I think she is stable for close outpatient follow-up.  Will give clindamycin admission for neurology symptoms or vertigo getting worse.  Will write short course of Antivert.  Discharged stable condition.  Strict return cautions advised and discussed.   Gilles Chiquito, MD 07/16/20 1048

## 2020-10-14 ENCOUNTER — Other Ambulatory Visit: Payer: Medicaid Other

## 2020-10-14 ENCOUNTER — Other Ambulatory Visit: Payer: Self-pay

## 2020-10-14 DIAGNOSIS — N118 Other chronic tubulo-interstitial nephritis: Secondary | ICD-10-CM

## 2020-10-14 DIAGNOSIS — N151 Renal and perinephric abscess: Secondary | ICD-10-CM

## 2020-10-14 NOTE — Addendum Note (Signed)
Addended by: Lizbeth Bark on: 10/14/2020 03:12 PM   Modules accepted: Orders

## 2020-10-14 NOTE — Addendum Note (Signed)
Addended by: Lizbeth Bark on: 10/14/2020 03:16 PM   Modules accepted: Orders

## 2020-10-15 ENCOUNTER — Other Ambulatory Visit: Payer: Medicaid Other

## 2020-10-20 ENCOUNTER — Other Ambulatory Visit
Admission: RE | Admit: 2020-10-20 | Discharge: 2020-10-20 | Disposition: A | Discharge status: Home / Self Care | Payer: Medicaid Other | Source: Ambulatory Visit | Attending: Physician Assistant | Admitting: Physician Assistant

## 2020-10-20 DIAGNOSIS — N118 Other chronic tubulo-interstitial nephritis: Secondary | ICD-10-CM | POA: Insufficient documentation

## 2020-10-20 LAB — BASIC METABOLIC PANEL
Anion gap: 7 (ref 5–15)
BUN: 22 mg/dL — ABNORMAL HIGH (ref 6–20)
CO2: 19 mmol/L — ABNORMAL LOW (ref 22–32)
Calcium: 8.6 mg/dL — ABNORMAL LOW (ref 8.9–10.3)
Chloride: 111 mmol/L (ref 98–111)
Creatinine, Ser: 1.11 mg/dL — ABNORMAL HIGH (ref 0.44–1.00)
GFR, Estimated: >60 mL/min (ref >60)
Glucose, Bld: 258 mg/dL — ABNORMAL HIGH (ref 70–99)
Potassium: 4.1 mmol/L (ref 3.5–5.1)
Sodium: 137 mmol/L (ref 135–145)

## 2020-10-22 ENCOUNTER — Ambulatory Visit: Payer: Medicaid Other | Admitting: Urology

## 2020-10-29 ENCOUNTER — Ambulatory Visit: Payer: Medicaid Other | Admitting: Urology

## 2020-11-12 ENCOUNTER — Encounter: Payer: Self-pay | Admitting: Urology

## 2020-11-12 ENCOUNTER — Other Ambulatory Visit: Payer: Self-pay

## 2020-11-12 ENCOUNTER — Ambulatory Visit (INDEPENDENT_AMBULATORY_CARE_PROVIDER_SITE_OTHER): Payer: Medicaid Other | Admitting: Urology

## 2020-11-12 VITALS — BP 120/77 | HR 86 | Ht 66.0 in | Wt 230.0 lb

## 2020-11-12 DIAGNOSIS — N118 Other chronic tubulo-interstitial nephritis: Secondary | ICD-10-CM

## 2020-11-12 NOTE — Progress Notes (Signed)
   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  She ultimately underwent an uncomplicated left hand-assisted laparoscopic simple nephrectomy in March 2022, and pathology showed a chronically infected kidney.  Recent renal function was normal with EGFR greater than 60.  She continues to do very well.  She is really not having any kind of abdominal pain and denies any UTIs.  We discussed the importance of continuing to monitor the renal function on a yearly basis with her PCP, and the importance of blood pressure and diabetes control.  Blood pressure today is normal at 120/77.  Follow-up with urology as needed  [place], [place] [address], [place], [place] [address] [phone]

## 2020-12-09 ENCOUNTER — Inpatient Hospital Stay
Admission: EM | Admit: 2020-12-09 | Discharge: 2020-12-13 | DRG: 617 | Disposition: A | Discharge status: Home / Self Care | Payer: Medicaid Other | Attending: Internal Medicine | Admitting: Internal Medicine

## 2020-12-09 ENCOUNTER — Other Ambulatory Visit: Payer: Self-pay

## 2020-12-09 DIAGNOSIS — L089 Local infection of the skin and subcutaneous tissue, unspecified: Principal | ICD-10-CM

## 2020-12-09 DIAGNOSIS — E1169 Type 2 diabetes mellitus with other specified complication: Principal | ICD-10-CM | POA: Diagnosis present

## 2020-12-09 DIAGNOSIS — L97519 Non-pressure chronic ulcer of other part of right foot with unspecified severity: Secondary | ICD-10-CM | POA: Diagnosis present

## 2020-12-09 DIAGNOSIS — E1142 Type 2 diabetes mellitus with diabetic polyneuropathy: Secondary | ICD-10-CM | POA: Diagnosis present

## 2020-12-09 DIAGNOSIS — M869 Osteomyelitis, unspecified: Secondary | ICD-10-CM

## 2020-12-09 DIAGNOSIS — Z880 Allergy status to penicillin: Secondary | ICD-10-CM

## 2020-12-09 DIAGNOSIS — E11621 Type 2 diabetes mellitus with foot ulcer: Secondary | ICD-10-CM | POA: Diagnosis present

## 2020-12-09 DIAGNOSIS — E1151 Type 2 diabetes mellitus with diabetic peripheral angiopathy without gangrene: Secondary | ICD-10-CM | POA: Diagnosis present

## 2020-12-09 DIAGNOSIS — M2040 Other hammer toe(s) (acquired), unspecified foot: Secondary | ICD-10-CM | POA: Diagnosis present

## 2020-12-09 DIAGNOSIS — Z09 Encounter for follow-up examination after completed treatment for conditions other than malignant neoplasm: Secondary | ICD-10-CM

## 2020-12-09 DIAGNOSIS — E662 Morbid (severe) obesity with alveolar hypoventilation: Secondary | ICD-10-CM | POA: Diagnosis present

## 2020-12-09 DIAGNOSIS — E282 Polycystic ovarian syndrome: Secondary | ICD-10-CM | POA: Diagnosis present

## 2020-12-09 DIAGNOSIS — Z86718 Personal history of other venous thrombosis and embolism: Secondary | ICD-10-CM

## 2020-12-09 DIAGNOSIS — Z905 Acquired absence of kidney: Secondary | ICD-10-CM

## 2020-12-09 DIAGNOSIS — L03031 Cellulitis of right toe: Secondary | ICD-10-CM | POA: Diagnosis present

## 2020-12-09 DIAGNOSIS — L97514 Non-pressure chronic ulcer of other part of right foot with necrosis of bone: Secondary | ICD-10-CM

## 2020-12-09 DIAGNOSIS — E11628 Type 2 diabetes mellitus with other skin complications: Secondary | ICD-10-CM | POA: Diagnosis present

## 2020-12-09 DIAGNOSIS — Z881 Allergy status to other antibiotic agents status: Secondary | ICD-10-CM

## 2020-12-09 DIAGNOSIS — E119 Type 2 diabetes mellitus without complications: Secondary | ICD-10-CM

## 2020-12-09 DIAGNOSIS — Z794 Long term (current) use of insulin: Secondary | ICD-10-CM

## 2020-12-09 DIAGNOSIS — Z8616 Personal history of COVID-19: Secondary | ICD-10-CM

## 2020-12-09 DIAGNOSIS — Z6841 Body Mass Index (BMI) 40.0 and over, adult: Secondary | ICD-10-CM

## 2020-12-09 DIAGNOSIS — M5136 Other intervertebral disc degeneration, lumbar region: Secondary | ICD-10-CM | POA: Diagnosis present

## 2020-12-09 DIAGNOSIS — E785 Hyperlipidemia, unspecified: Secondary | ICD-10-CM | POA: Diagnosis present

## 2020-12-09 DIAGNOSIS — I10 Essential (primary) hypertension: Secondary | ICD-10-CM | POA: Diagnosis present

## 2020-12-09 DIAGNOSIS — Z981 Arthrodesis status: Secondary | ICD-10-CM

## 2020-12-09 DIAGNOSIS — Z79899 Other long term (current) drug therapy: Secondary | ICD-10-CM

## 2020-12-09 DIAGNOSIS — Z7984 Long term (current) use of oral hypoglycemic drugs: Secondary | ICD-10-CM

## 2020-12-09 LAB — CBC
HCT: 31.3 % — ABNORMAL LOW (ref 36.0–46.0)
Hemoglobin: 9.7 g/dL — ABNORMAL LOW (ref 12.0–15.0)
MCH: 28.5 pg (ref 26.0–34.0)
MCHC: 31 g/dL (ref 30.0–36.0)
MCV: 92.1 fL (ref 80.0–100.0)
Platelets: 228 10*3/uL (ref 150–400)
RBC: 3.4 MIL/uL — ABNORMAL LOW (ref 3.87–5.11)
RDW: 13.1 % (ref 11.5–15.5)
WBC: 7.9 10*3/uL (ref 4.0–10.5)
nRBC: 0 % (ref 0.0–0.2)

## 2020-12-09 LAB — BASIC METABOLIC PANEL
Anion gap: 6 (ref 5–15)
BUN: 36 mg/dL — ABNORMAL HIGH (ref 6–20)
CO2: 19 mmol/L — ABNORMAL LOW (ref 22–32)
Calcium: 9 mg/dL (ref 8.9–10.3)
Chloride: 112 mmol/L — ABNORMAL HIGH (ref 98–111)
Creatinine, Ser: 1.08 mg/dL — ABNORMAL HIGH (ref 0.44–1.00)
GFR, Estimated: >60 mL/min (ref >60)
Glucose, Bld: 94 mg/dL (ref 70–99)
Potassium: 3.9 mmol/L (ref 3.5–5.1)
Sodium: 137 mmol/L (ref 135–145)

## 2020-12-09 NOTE — ED Triage Notes (Signed)
Pt to ED for admission for IV antibiotics and amputation of right fourth toe amputation. diabetic

## 2020-12-10 ENCOUNTER — Emergency Department: Payer: Medicaid Other

## 2020-12-10 ENCOUNTER — Encounter: Payer: Self-pay | Admitting: Internal Medicine

## 2020-12-10 ENCOUNTER — Inpatient Hospital Stay: Payer: Medicaid Other

## 2020-12-10 DIAGNOSIS — E1151 Type 2 diabetes mellitus with diabetic peripheral angiopathy without gangrene: Secondary | ICD-10-CM | POA: Diagnosis present

## 2020-12-10 DIAGNOSIS — L97519 Non-pressure chronic ulcer of other part of right foot with unspecified severity: Secondary | ICD-10-CM | POA: Diagnosis present

## 2020-12-10 DIAGNOSIS — Z7984 Long term (current) use of oral hypoglycemic drugs: Secondary | ICD-10-CM | POA: Diagnosis not present

## 2020-12-10 DIAGNOSIS — M2040 Other hammer toe(s) (acquired), unspecified foot: Secondary | ICD-10-CM | POA: Diagnosis present

## 2020-12-10 DIAGNOSIS — E1169 Type 2 diabetes mellitus with other specified complication: Secondary | ICD-10-CM | POA: Diagnosis present

## 2020-12-10 DIAGNOSIS — Z6841 Body Mass Index (BMI) 40.0 and over, adult: Secondary | ICD-10-CM | POA: Diagnosis not present

## 2020-12-10 DIAGNOSIS — M869 Osteomyelitis, unspecified: Secondary | ICD-10-CM | POA: Diagnosis not present

## 2020-12-10 DIAGNOSIS — I10 Essential (primary) hypertension: Secondary | ICD-10-CM | POA: Diagnosis present

## 2020-12-10 DIAGNOSIS — Z905 Acquired absence of kidney: Secondary | ICD-10-CM | POA: Diagnosis not present

## 2020-12-10 DIAGNOSIS — E11628 Type 2 diabetes mellitus with other skin complications: Secondary | ICD-10-CM | POA: Diagnosis present

## 2020-12-10 DIAGNOSIS — E282 Polycystic ovarian syndrome: Secondary | ICD-10-CM | POA: Diagnosis present

## 2020-12-10 DIAGNOSIS — E11621 Type 2 diabetes mellitus with foot ulcer: Secondary | ICD-10-CM | POA: Diagnosis present

## 2020-12-10 DIAGNOSIS — E785 Hyperlipidemia, unspecified: Secondary | ICD-10-CM | POA: Diagnosis present

## 2020-12-10 DIAGNOSIS — E1142 Type 2 diabetes mellitus with diabetic polyneuropathy: Secondary | ICD-10-CM | POA: Diagnosis present

## 2020-12-10 DIAGNOSIS — E662 Morbid (severe) obesity with alveolar hypoventilation: Secondary | ICD-10-CM | POA: Diagnosis present

## 2020-12-10 DIAGNOSIS — L03031 Cellulitis of right toe: Secondary | ICD-10-CM | POA: Diagnosis present

## 2020-12-10 DIAGNOSIS — M5136 Other intervertebral disc degeneration, lumbar region: Secondary | ICD-10-CM | POA: Diagnosis present

## 2020-12-10 DIAGNOSIS — Z794 Long term (current) use of insulin: Secondary | ICD-10-CM | POA: Diagnosis not present

## 2020-12-10 DIAGNOSIS — Z86718 Personal history of other venous thrombosis and embolism: Secondary | ICD-10-CM | POA: Diagnosis not present

## 2020-12-10 DIAGNOSIS — Z981 Arthrodesis status: Secondary | ICD-10-CM | POA: Diagnosis not present

## 2020-12-10 DIAGNOSIS — Z880 Allergy status to penicillin: Secondary | ICD-10-CM | POA: Diagnosis not present

## 2020-12-10 DIAGNOSIS — L089 Local infection of the skin and subcutaneous tissue, unspecified: Secondary | ICD-10-CM | POA: Diagnosis present

## 2020-12-10 DIAGNOSIS — Z79899 Other long term (current) drug therapy: Secondary | ICD-10-CM | POA: Diagnosis not present

## 2020-12-10 DIAGNOSIS — Z8616 Personal history of COVID-19: Secondary | ICD-10-CM | POA: Diagnosis not present

## 2020-12-10 DIAGNOSIS — Z881 Allergy status to other antibiotic agents status: Secondary | ICD-10-CM | POA: Diagnosis not present

## 2020-12-10 LAB — CBG MONITORING, ED
Glucose-Capillary: 44 mg/dL — CL (ref 70–99)
Glucose-Capillary: 56 mg/dL — ABNORMAL LOW (ref 70–99)
Glucose-Capillary: 121 mg/dL — ABNORMAL HIGH (ref 70–99)
Glucose-Capillary: 129 mg/dL — ABNORMAL HIGH (ref 70–99)
Glucose-Capillary: 138 mg/dL — ABNORMAL HIGH (ref 70–99)
Glucose-Capillary: 195 mg/dL — ABNORMAL HIGH (ref 70–99)

## 2020-12-10 LAB — RESP PANEL BY RT-PCR (FLU A&B, COVID) ARPGX2
Influenza A by PCR: NEGATIVE
Influenza B by PCR: NEGATIVE
SARS Coronavirus 2 by RT PCR: NEGATIVE

## 2020-12-10 LAB — GLUCOSE, CAPILLARY
Glucose-Capillary: 96 mg/dL (ref 70–99)
Glucose-Capillary: 114 mg/dL — ABNORMAL HIGH (ref 70–99)

## 2020-12-10 LAB — HIV ANTIBODY (ROUTINE TESTING W REFLEX): HIV Screen 4th Generation wRfx: NONREACTIVE

## 2020-12-10 LAB — MRSA NEXT GEN BY PCR, NASAL: MRSA by PCR Next Gen: NOT DETECTED

## 2020-12-10 LAB — HEMOGLOBIN A1C
Hgb A1c MFr Bld: 7.5 % — ABNORMAL HIGH (ref 4.8–5.6)
Mean Plasma Glucose: 169 mg/dL

## 2020-12-10 MED ORDER — ACETAMINOPHEN 325 MG PO TABS: 650.0000 mg | ORAL_TABLET | Freq: Four times a day (QID) | ORAL | Status: DC | PRN

## 2020-12-10 MED ORDER — ACETAMINOPHEN 650 MG RE SUPP: 650.0000 mg | Freq: Four times a day (QID) | RECTAL | Status: DC | PRN

## 2020-12-10 MED ORDER — ONDANSETRON HCL 4 MG/2ML IJ SOLN: 4.0000 mg | Freq: Four times a day (QID) | INTRAMUSCULAR | Status: DC | PRN

## 2020-12-10 MED ORDER — VANCOMYCIN HCL IN DEXTROSE 1-5 GM/200ML-% IV SOLN
1000.0000 mg | Freq: Once | INTRAVENOUS | Status: AC
  Administered 2020-12-10: 1000 mg via INTRAVENOUS
  Filled 2020-12-10: qty 200

## 2020-12-10 MED ORDER — SODIUM CHLORIDE 0.9 % IV SOLN
1.0000 g | Freq: Once | INTRAVENOUS | Status: AC
  Administered 2020-12-10: 1 g via INTRAVENOUS
  Filled 2020-12-10: qty 1

## 2020-12-10 MED ORDER — PIPERACILLIN-TAZOBACTAM 3.375 G IVPB
3.3750 g | Freq: Three times a day (TID) | INTRAVENOUS | Status: DC
  Administered 2020-12-10 (×2): 3.375 g via INTRAVENOUS
  Filled 2020-12-10 (×2): qty 50

## 2020-12-10 MED ORDER — ATORVASTATIN CALCIUM 20 MG PO TABS
40.0000 mg | ORAL_TABLET | Freq: Every day | ORAL | Status: DC
  Administered 2020-12-10 – 2020-12-13 (×3): 40 mg via ORAL
  Filled 2020-12-10 (×3): qty 2

## 2020-12-10 MED ORDER — LOSARTAN POTASSIUM 50 MG PO TABS
50.0000 mg | ORAL_TABLET | Freq: Every day | ORAL | Status: DC
  Administered 2020-12-10 – 2020-12-13 (×3): 50 mg via ORAL
  Filled 2020-12-10 (×3): qty 1

## 2020-12-10 MED ORDER — VANCOMYCIN HCL 500 MG/100ML IV SOLN
500.0000 mg | Freq: Once | INTRAVENOUS | Status: AC
  Administered 2020-12-10: 500 mg via INTRAVENOUS
  Filled 2020-12-10 (×2): qty 100

## 2020-12-10 MED ORDER — INSULIN ASPART 100 UNIT/ML IJ SOLN
0.0000 [IU] | Freq: Three times a day (TID) | INTRAMUSCULAR | Status: DC
  Administered 2020-12-10: 14:00:00 4 [IU] via SUBCUTANEOUS
  Administered 2020-12-10: 3 [IU] via SUBCUTANEOUS
  Administered 2020-12-11: 4 [IU] via SUBCUTANEOUS
  Administered 2020-12-11: 3 [IU] via SUBCUTANEOUS
  Administered 2020-12-12 – 2020-12-13 (×2): 4 [IU] via SUBCUTANEOUS
  Administered 2020-12-13: 3 [IU] via SUBCUTANEOUS
  Filled 2020-12-10 (×7): qty 1

## 2020-12-10 MED ORDER — VANCOMYCIN HCL 1500 MG/300ML IV SOLN
1500.0000 mg | INTRAVENOUS | Status: DC
  Administered 2020-12-11 – 2020-12-13 (×3): 1500 mg via INTRAVENOUS
  Filled 2020-12-10 (×5): qty 300

## 2020-12-10 MED ORDER — VANCOMYCIN HCL 1250 MG/250ML IV SOLN
1250.0000 mg | Freq: Once | INTRAVENOUS | Status: DC
  Filled 2020-12-10: qty 250

## 2020-12-10 MED ORDER — INSULIN ASPART 100 UNIT/ML IJ SOLN: 0.0000 [IU] | Freq: Every day | INTRAMUSCULAR | Status: DC

## 2020-12-10 MED ORDER — HYDROCODONE-ACETAMINOPHEN 5-325 MG PO TABS
1.0000 | ORAL_TABLET | ORAL | Status: DC | PRN
  Administered 2020-12-12 – 2020-12-13 (×2): 2 via ORAL
  Filled 2020-12-10 (×2): qty 2

## 2020-12-10 MED ORDER — ONDANSETRON HCL 4 MG PO TABS: 4.0000 mg | ORAL_TABLET | Freq: Four times a day (QID) | ORAL | Status: DC | PRN

## 2020-12-10 MED ORDER — TOPIRAMATE 25 MG PO TABS
150.0000 mg | ORAL_TABLET | Freq: Two times a day (BID) | ORAL | Status: DC
  Administered 2020-12-10 – 2020-12-13 (×6): 150 mg via ORAL
  Filled 2020-12-10 (×6): qty 2
  Filled 2020-12-10: qty 6
  Filled 2020-12-10: qty 2

## 2020-12-10 MED ORDER — ENOXAPARIN SODIUM 60 MG/0.6ML IJ SOSY
0.5000 mg/kg | PREFILLED_SYRINGE | INTRAMUSCULAR | Status: DC
  Administered 2020-12-10 – 2020-12-13 (×3): 57.5 mg via SUBCUTANEOUS
  Filled 2020-12-10 (×3): qty 0.6

## 2020-12-10 MED ORDER — MORPHINE SULFATE (PF) 2 MG/ML IV SOLN: 2.0000 mg | INTRAVENOUS | Status: DC | PRN

## 2020-12-10 NOTE — Progress Notes (Signed)
PROGRESS NOTE    Katelyn Barnes  ZMO:[id number] DOB: 1980-11-18 DOA: 12/09/2020 PCP: Cathie Hoops, PA   Brief Narrative: Taken from H&P. Katelyn Barnes is a 40 y.o. female with medical history significant for Diabetes, hypertension, class III obesity with obesity hypoventilation syndrome who was sent in for admission by her podiatrist due to concerns for osteomyelitis of the fourth toe of the right foot.  Patient has completed 2 courses of antibiotics with Keflex and doxycycline but continues to have increasing pain now with redness and swelling.  X-ray concerning for osteomyelitis. Podiatry consulted vascular surgery for angiography, ABI results pending. She was started on Zosyn and vancomycin, antibiotic switched to cefepime and vancomycin. MRI ordered by podiatry.  Subjective: Patient continued to have 5 out of 10 pain in right foot.  Husband at bedside.  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  Patient will need lower extremity angiography before proceeding with amputation.  MRI was also ordered.  ABI done-pending results -Switch Zosyn with cefepime -Continue with vancomycin  Class III obesity. Estimated body mass index is 40.03 kg/m as calculated from the following:   Height as of this encounter: 5\' 6"  (1.676 m).   Weight as of this encounter: 112.5 kg.   Type 2 diabetes mellitus. -Continue with SSI  Essential hypertension.  Blood pressure within goal. -Continue with home dose of losartan  Objective: Vitals:   12/10/20 0920 12/10/20 1155 12/10/20 1226 12/10/20 1614  BP: 125/76  (!) 145/81 99/63  Pulse: 87  85 84  Resp: 15  16 16   Temp:  98.5 F (36.9 C)  97.9 F (36.6 C) 98.1 F (36.7 C)  TempSrc:  Oral    SpO2: 100%  100% 100%  Weight:      Height:        Intake/Output Summary (Last 24 hours) at 12/10/2020 1729 Last data filed at 12/10/2020 1500 Gross per 24 hour  Intake 536.52 ml  Output --  Net 536.52 ml   Filed Weights   12/09/20 1826  Weight: 112.5 kg    Examination:  General exam: Appears calm and comfortable  Respiratory system: Clear to auscultation. Respiratory effort normal. Cardiovascular system: S1 & S2 heard, RRR.  Gastrointestinal system: Soft, nontender, nondistended, bowel sounds positive. Central nervous system: Alert and oriented. No focal neurological deficits. Extremities: Right foot edema, no cyanosis, pulses intact and symmetrical. Skin: Erythema and edema of fourth right toe with surrounding edema involving the dorsum of foot Psychiatry: Judgement and insight appear normal. Mood & affect appropriate.    DVT prophylaxis: Lovenox Code Status: Full Family Communication:  Disposition Plan:  Status is: Inpatient  Remains inpatient appropriate because: Severity of illness   Level of care: Med-Surg  All the records are reviewed and case discussed with Care Management/Social Worker. Management plans discussed with the patient, nursing and they are in agreement.  Consultants:  Podiatry Vascular surgery  Procedures:  Antimicrobials:  Cefepime Vancomycin  Data Reviewed: I have personally reviewed following labs and imaging studies  CBC: Recent Labs  Lab 12/09/20 1828  WBC 7.9  HGB 9.7*  HCT 31.3*  MCV 92.1  PLT 228   Basic Metabolic Panel: Recent Labs  Lab 12/09/20  1828  NA 137  K 3.9  CL 112*  CO2 19*  GLUCOSE 94  BUN 36*  CREATININE 1.08*  CALCIUM 9.0   GFR: Estimated Creatinine Clearance: 87.2 mL/min (A) (by C-G formula based on SCr of 1.08 mg/dL (H)). Liver Function Tests: No results for input(s): AST, ALT, ALKPHOS, BILITOT, PROT, ALBUMIN in the last 168 hours. No  results for input(s): LIPASE, AMYLASE in the last 168 hours. No results for input(s): AMMONIA in the last 168 hours. Coagulation Profile: No results for input(s): INR, PROTIME in the last 168 hours. Cardiac Enzymes: No results for input(s): CKTOTAL, CKMB, CKMBINDEX, TROPONINI in the last 168 hours. BNP (last 3 results) No results for input(s): PROBNP in the last 8760 hours. HbA1C: No results for input(s): HGBA1C in the last 72 hours. CBG: Recent Labs  Lab 12/10/20 0334 12/10/20 0734 12/10/20 0903 12/10/20 1158 12/10/20 1615  GLUCAP 121* 129* 138* 195* 96   Lipid Profile: No results for input(s): CHOL, HDL, LDLCALC, TRIG, CHOLHDL, LDLDIRECT in the last 72 hours. Thyroid Function Tests: No results for input(s): TSH, T4TOTAL, FREET4, T3FREE, THYROIDAB in the last 72 hours. Anemia Panel: No results for input(s): VITAMINB12, FOLATE, FERRITIN, TIBC, IRON, RETICCTPCT in the last 72 hours. Sepsis Labs: No results for input(s): PROCALCITON, LATICACIDVEN in the last 168 hours.  Recent Results (from the past 240 hour(s))  Resp Panel by RT-PCR (Flu A&B, Covid) Nasopharyngeal Swab     Status: None   Collection Time: 12/10/20 12:12 AM   Specimen: Nasopharyngeal Swab; Nasopharyngeal(NP) swabs in vial transport medium  Result Value Ref Range Status   SARS Coronavirus 2 by RT PCR NEGATIVE NEGATIVE Final    Comment: (NOTE) SARS-CoV-2 target nucleic acids are NOT DETECTED.  The SARS-CoV-2 RNA is generally detectable in upper respiratory specimens during the acute phase of infection. The lowest concentration of SARS-CoV-2 viral copies this assay can detect is 138 copies/mL. A negative result does not preclude SARS-Cov-2 infection and should not be used as the sole basis for treatment or other patient management decisions. A negative result may occur with  improper specimen collection/handling, submission of specimen other than nasopharyngeal swab, presence of viral mutation(s) within  the areas targeted by this assay, and inadequate number of viral copies(<138 copies/mL). A negative result must be combined with clinical observations, patient history, and epidemiological information. The expected result is Negative.  Fact Sheet for Patients:  BloggerCourse.com  Fact Sheet for Healthcare Providers:  SeriousBroker.it  This test is no t yet approved or cleared by the [place] FDA and  has been authorized for detection and/or diagnosis of SARS-CoV-2 by FDA under an Emergency Use Authorization (EUA). This EUA will remain  in effect (meaning this test can be used) for the duration of the COVID-19 declaration under Section 564(b)(1) of the Act, 21 U.S.C.section 360bbb-3(b)(1), unless the authorization is terminated  or revoked sooner.       Influenza A by PCR NEGATIVE NEGATIVE Final   Influenza B by PCR NEGATIVE NEGATIVE Final    Comment: (NOTE) The Xpert Xpress SARS-CoV-2/FLU/RSV plus assay is intended as an aid in the diagnosis of influenza from Nasopharyngeal swab specimens and should not be used as a sole basis for treatment. Nasal washings and aspirates are unacceptable for Xpert Xpress SARS-CoV-2/FLU/RSV testing.  Fact Sheet for Patients: BloggerCourse.com  Fact Sheet for Healthcare Providers: SeriousBroker.it  This test is not yet approved or cleared by the [place] FDA and has been authorized for detection and/or diagnosis of SARS-CoV-2 by  FDA under an Emergency Use Authorization (EUA). This EUA will remain in effect (meaning this test can be used) for the duration of the COVID-19 declaration under Section 564(b)(1) of the Act, 21 U.S.C. section 360bbb-3(b)(1), unless the authorization is terminated or revoked.  Performed at [facility], [address] [place], [place] [address]   MRSA Next Gen by PCR, Nasal     Status: None    Collection Time: 12/10/20 12:24 PM   Specimen: Nasal Mucosa; Nasal Swab  Result Value Ref Range Status   MRSA by PCR Next Gen NOT DETECTED NOT DETECTED Final    Comment: (NOTE) The GeneXpert MRSA Assay (FDA approved for NASAL specimens only), is one component of a comprehensive MRSA colonization surveillance program. It is not intended to diagnose MRSA infection nor to guide or monitor treatment for MRSA infections. Test performance is not FDA approved in patients less than 27 years old. Performed at [facility], [address] [place], [place] [address]      Radiology Studies: DG Foot 2 Views Right  Result Date: 12/10/2020 CLINICAL DATA:  Infection. EXAM: RIGHT FOOT - 2 VIEW COMPARISON:  None. FINDINGS: There is erosive changes of the proximal phalanx of the fourth digit which is age indeterminate and may represent sequela of osteomyelitis. A small triangular bone fragment medial to the navicular noted which is also age indeterminate. Correlation with clinical exam and point tenderness recommended. No dislocation. Diffuse subcutaneous edema. IMPRESSION: 1. Erosive changes of the proximal phalanx of the fourth digit, age indeterminate and may represent sequela of osteomyelitis. 2. Small triangular bone fragment medial to the navicular, also age indeterminate. Electronically Signed   By: Elgie Collard M.D.   On: 12/10/2020 00:42    Scheduled Meds:  atorvastatin  40 mg Oral Daily   enoxaparin (LOVENOX) injection  0.5 mg/kg Subcutaneous Q24H   insulin aspart  0-20 Units Subcutaneous TID WC   insulin aspart  0-5 Units Subcutaneous QHS   losartan  50 mg Oral Daily   topiramate  150 mg Oral BID   Continuous Infusions:  piperacillin-tazobactam (ZOSYN)  IV 3.375 g (12/10/20 1401)   [START ON 12/11/2020] vancomycin       LOS: 0 days   Time spent: 40 minutes. More than 50% of the time was spent in counseling/coordination of care  Arnetha Courser, MD [facility]  If  7PM-7AM, please contact night-coverage [email or web address]  12/10/2020, 5:29 PM   This record has been created using Conservation officer, historic buildings. Errors have been sought and corrected,but may not always be located. Such creation errors do not reflect on the standard of care.

## 2020-12-10 NOTE — ED Notes (Signed)
Fsbs 44

## 2020-12-10 NOTE — ED Notes (Signed)
Iv team in with pt now 

## 2020-12-10 NOTE — Progress Notes (Signed)
PHARMACIST - PHYSICIAN COMMUNICATION  CONCERNING:  Enoxaparin (Lovenox) for DVT Prophylaxis    RECOMMENDATION: Patient was prescribed enoxaprin 40mg  q24 hours for VTE prophylaxis.   Filed Weights   12/09/20 1826  Weight: 112.5 kg (248 lb)    Body mass index is 40.03 kg/m.  Estimated Creatinine Clearance: 87.2 mL/min (A) (by C-G formula based on SCr of 1.08 mg/dL (H)).   Based on [facility] policy patient is candidate for enoxaparin 0.5mg /kg TBW SQ every 24 hours based on BMI being >30.  DESCRIPTION: Pharmacy has adjusted enoxaparin dose per [facility] policy.  Patient is now receiving enoxaparin 0.5 mg/kg every 24 hours   [facility], PharmD, [facility] 12/10/2020 2:19 AM

## 2020-12-10 NOTE — ED Notes (Signed)
Attempted x 2 for iv without success.  Pt tolerated well.  Order for iv team consult placed.

## 2020-12-10 NOTE — ED Notes (Signed)
Iv started.  Meds infusing

## 2020-12-10 NOTE — H&P (Signed)
History and Physical    Isebella Upshur WUJ:[id number] DOB: Mar 19, 1980 DOA: 12/09/2020  PCP: Cathie Hoops, PA   Patient coming from: home  I have personally briefly reviewed patient's relevant medical records in [facility]  Chief Complaint:    HPI: Ayan Yankey is a 40 y.o. female with medical history significant for Diabetes, hypertension, class III obesity with obesity hypoventilation syndrome who was sent in for admission by her podiatrist due to concerns for osteomyelitis of the fourth toe of the right foot.  Patient has completed 2 courses of antibiotics with Keflex and doxycycline but continues to have increasing pain now with redness and swelling.  She denies any fevers or chills or nausea and vomiting  ED course: Vitals unremarkable Blood work significant for WBC 7900, hemoglobin 9.7, creatinine 1.09 which appears to be her baseline  X-ray right foot showing erosive changes of the proximal phalanx of the fourth digit which may represent sequela of osteomyelitis  Patient started on cefepime and vancomycin.  Hospitalist consulted for admission.   Review of Systems: As per HPI otherwise all other systems on review of systems negative.   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    Diabetic foot infection (HCC) - Continue Zosyn and vancomycin - Podiatry consult    Obesity hypoventilation syndrome (HCC) --CPAP if desired    Obesity, Class III, BMI 40-49.9 (morbid obesity) (HCC) -Complicating factor to overall prognosis and care    Diabetes mellitus (HCC) - Sliding scale insulin coverage    Essential hypertension - Continue   DVT prophylaxis: Lovenox  Code Status: full code  Family Communication:  none  Disposition Plan: Back to previous home environment Consults called: Podiatry Status:At the time of admission, it appears that the appropriate admission status for this patient is INPATIENT. This is judged to be reasonable  and necessary in order to provide the required intensity of service to ensure the patient's safety given the presenting symptoms, physical exam findings, and initial radiographic and laboratory data in the context of their  Comorbid conditions.   Patient requires inpatient status due to high intensity of service, high risk for further deterioration and high frequency of surveillance required.   I certify that at the point of admission it is my clinical judgment that the patient will require inpatient hospital care spanning beyond 2 midnights     Physical Exam: Vitals:   12/09/20 1826 12/09/20 2236 12/10/20 0120  BP: 138/76 133/81 127/74  Pulse: 90 87 78  Resp: 20 18 18   Temp: 98.1 F (36.7 C) (!) 96.7 F (35.9 C)   TempSrc: Oral    SpO2: 100% 99% 100%  Weight: 112.5 kg    Height: 5\' 6"  (1.676 m)     Constitutional: Alert, oriented x 3 . Not in any apparent distress HEENT:      Head: Normocephalic and atraumatic.         Eyes: PERLA, EOMI, Conjunctivae are normal. Sclera is non-icteric.       Mouth/Throat: Mucous membranes are moist.       Neck: Supple with no signs of meningismus. Cardiovascular: Regular rate and rhythm. No murmurs, gallops, or rubs. 2+ symmetrical distal pulses are present . No JVD. No  LE edema Respiratory: Respiratory effort normal .Lungs sounds clear bilaterally. No wheezes, crackles, or rhonchi.  Gastrointestinal: Soft, non tender, non distended. Positive bowel sounds.  Genitourinary: No CVA tenderness. Musculoskeletal: Nontender with normal range of motion in all extremities. Right fourth  toe with pain redness and swelling.  Neurologic:  Face is symmetric. Moving all extremities. No gross focal neurologic deficits . Skin: Skin is warm, dry.  No rash or ulcers Psychiatric: Mood and affect are appropriate     Past Medical History:  Diagnosis Date   Anemia    iron transfusions x 2   COVID-19    DDD (degenerative disc disease), lumbar    Diabetes  mellitus without complication (HCC)    Type II   DVT (deep venous thrombosis) (HCC) 2016   post back surgery - was on Eliquis 3 months   Kidney stone     Past Surgical History:  Procedure Laterality Date   ANTERIOR CERVICAL DECOMP/DISCECTOMY FUSION Right 05/05/2016   Procedure: ANTERIOR CERVICAL DECOMPRESSION FUSION, CERVICAL 7- THORACIC 1 WITH INSTRUMENTATION AND ALLOGRAFT;  Surgeon: Estill Bamberg, MD;  Location: [facility] OR;  Service: Orthopedics;  Laterality: Right;  ANTERIOR CERVICAL DECOMPRESSION FUSION, CERVICAL 7- THORACIC 1 WITH INSTRUMENTATION AND ALLOGRAFT   BACK SURGERY  2016   discectomy    CARPAL TUNNEL RELEASE Bilateral    CYSTOSCOPY W/ RETROGRADES Left 07/06/2019   Procedure: CYSTOSCOPY WITH RETROGRADE PYELOGRAM;  Surgeon: [name], MD;  Location: [facility] ORS;  Service: Urology;  Laterality: Left;   CYSTOSCOPY WITH URETEROSCOPY AND STENT PLACEMENT Left 06/11/2019   Procedure: CYSTOSCOPY WITH URETEROSCOPY AND STENT PLACEMENT;  Surgeon: Jerilee Field, MD;  Location: [facility] ORS;  Service: Urology;  Laterality: Left;   CYSTOSCOPY WITH URETEROSCOPY AND STENT PLACEMENT Left 07/06/2019   Procedure: CYSTOSCOPY WITH URETEROSCOPY AND STENT exchange;  Surgeon: [name], MD;  Location: [facility] ORS;  Service: Urology;  Laterality: Left;   IR NEPHROSTOMY EXCHANGE LEFT  10/19/2019   LAPAROSCOPIC NEPHRECTOMY, HAND ASSISTED Left 04/04/2020   Procedure: HAND ASSISTED LAPAROSCOPIC NEPHRECTOMY;  Surgeon: [name], MD;  Location: [facility] ORS;  Service: Urology;  Laterality: Left;   SHOULDER ARTHROSCOPY     TUBAL LIGATION  08/2007     reports that she has never smoked. She has never used smokeless tobacco. She reports that she does not drink alcohol and does not use drugs.  Allergies  Allergen Reactions   Penicillins Rash    14 years ago    Macrobid [Nitrofurantoin] Nausea And Vomiting    No family history on file.  Family history reviewed and not pertinent   Prior to Admission  medications   Medication Sig Start Date End Date Taking? Authorizing Provider  atorvastatin (LIPITOR) 40 MG tablet Take 40 mg by mouth.  07/10/19   [provider]  docusate sodium (COLACE) 100 MG capsule Take 1 capsule (100 mg total) by mouth 2 (two) times daily. 04/07/20   [name], MD  HYDROcodone-acetaminophen (NORCO/VICODIN) 5-325 MG tablet Take 1-2 tablets by mouth every 6 (six) hours as needed for moderate pain or severe pain. 06/18/20 06/18/21  Shaune Pollack, MD  insulin isophane & regular human (HUMULIN 70/30 KWIKPEN) (70-30) 100 UNIT/ML KwikPen Inject 40 Units into the skin 2 (two) times daily with a meal.  07/10/19 07/09/20  [provider]  losartan (COZAAR) 50 MG tablet Take 1 tablet (50 mg total) by mouth daily. 09/13/19   Shahmehdi, Gemma Payor, MD  meclizine (ANTIVERT) 25 MG tablet Take 1 tablet (25 mg total) by mouth 3 (three) times daily as needed for dizziness. 07/16/20   Gilles Chiquito, MD  metFORMIN (GLUCOPHAGE) 500 MG tablet Take 500 mg by mouth 2 (two) times daily. 08/04/19   [provider]  methylPREDNISolone (MEDROL  DOSEPAK) 4 MG TBPK tablet Take as directed 06/18/20   Shaune Pollack, MD  Multiple Vitamins-Minerals (MULTIVITAMIN WITH MINERALS) tablet Take 1 tablet by mouth daily.    [provider]      Labs on Admission: I have personally reviewed following labs and imaging studies  CBC: Recent Labs  Lab 12/09/20 1828  WBC 7.9  HGB 9.7*  HCT 31.3*  MCV 92.1  PLT 228   Basic Metabolic Panel: Recent Labs  Lab 12/09/20 1828  NA 137  K 3.9  CL 112*  CO2 19*  GLUCOSE 94  BUN 36*  CREATININE 1.08*  CALCIUM 9.0   GFR: Estimated Creatinine Clearance: 87.2 mL/min (A) (by C-G formula based on SCr of 1.08 mg/dL (H)). Liver Function Tests: No results for input(s): AST, ALT, ALKPHOS, BILITOT, PROT, ALBUMIN in the last 168 hours. No results for input(s): LIPASE, AMYLASE in the last 168 hours. No results for input(s): AMMONIA in  the last 168 hours. Coagulation Profile: No results for input(s): INR, PROTIME in the last 168 hours. Cardiac Enzymes: No results for input(s): CKTOTAL, CKMB, CKMBINDEX, TROPONINI in the last 168 hours. BNP (last 3 results) No results for input(s): PROBNP in the last 8760 hours. HbA1C: No results for input(s): HGBA1C in the last 72 hours. CBG: No results for input(s): GLUCAP in the last 168 hours. Lipid Profile: No results for input(s): CHOL, HDL, LDLCALC, TRIG, CHOLHDL, LDLDIRECT in the last 72 hours. Thyroid Function Tests: No results for input(s): TSH, T4TOTAL, FREET4, T3FREE, THYROIDAB in the last 72 hours. Anemia Panel: No results for input(s): VITAMINB12, FOLATE, FERRITIN, TIBC, IRON, RETICCTPCT in the last 72 hours. Urine analysis:    Component Value Date/Time   COLORURINE YELLOW (A) 07/15/2020 2349   APPEARANCEUR CLEAR (A) 07/15/2020 2349   APPEARANCEUR Cloudy (A) 02/28/2020 1321   LABSPEC 1.011 07/15/2020 2349   LABSPEC 1.037 05/27/2013 1839   PHURINE 6.0 07/15/2020 2349   GLUCOSEU NEGATIVE 07/15/2020 2349   GLUCOSEU >=500 05/27/2013 1839   HGBUR NEGATIVE 07/15/2020 2349   BILIRUBINUR NEGATIVE 07/15/2020 2349   BILIRUBINUR Negative 02/28/2020 1321   BILIRUBINUR Negative 05/27/2013 1839   KETONESUR NEGATIVE 07/15/2020 2349   PROTEINUR 30 (A) 07/15/2020 2349   NITRITE NEGATIVE 07/15/2020 2349   LEUKOCYTESUR NEGATIVE 07/15/2020 2349   LEUKOCYTESUR Negative 05/27/2013 1839    Radiological Exams on Admission: DG Foot 2 Views Right  Result Date: 12/10/2020 CLINICAL DATA:  Infection. EXAM: RIGHT FOOT - 2 VIEW COMPARISON:  None. FINDINGS: There is erosive changes of the proximal phalanx of the fourth digit which is age indeterminate and may represent sequela of osteomyelitis. A small triangular bone fragment medial to the navicular noted which is also age indeterminate. Correlation with clinical exam and point tenderness recommended. No dislocation. Diffuse subcutaneous  edema. IMPRESSION: 1. Erosive changes of the proximal phalanx of the fourth digit, age indeterminate and may represent sequela of osteomyelitis. 2. Small triangular bone fragment medial to the navicular, also age indeterminate. Electronically Signed   By: Elgie Collard M.D.   On: 12/10/2020 00:42       Andris Baumann MD [facility]   12/10/2020, 1:34 AM

## 2020-12-10 NOTE — ED Notes (Addendum)
Fsbs 56 on repeat. Dr Larinda Buttery aware.  Pt awake and talking.  Skin warm and dry.  No n/v/d   pt denies any pain.    Orange juice and crackers given to pt per dr Larinda Buttery   dr Para March messaged

## 2020-12-10 NOTE — Progress Notes (Addendum)
Pharmacy Antibiotic Note  Katelyn Barnes is a 40 y.o. female admitted on 12/09/2020 with wound infection (diabetic foot).  Pharmacy has been consulted for cefepime and Vancomycin dosing.  **pt failed outpatient doxy course prior to arrival.  Plan: Change Zosyn to cefepime 2g IV every 8 hours   Continue Vancomycin 1.5g q24h  Estimated AUC: 446 Scr: 1.04, Vd 0.5, Ke w/ IBW  MRSA PCR ordered Will CTM renal fxn ISO of VAN/ZOS combination.  Pharmacy will continue to follow and adjust dosing when warranted.  Height: 5\' 6"  (167.6 cm) Weight: 112.5 kg (248 lb) IBW/kg (Calculated) : 59.3  Temp (24hrs), Avg:97.4 F (36.3 C), Min:96.7 F (35.9 C), Max:98.1 F (36.7 C)  Recent Labs  Lab 12/09/20 1828  WBC 7.9  CREATININE 1.08*     Estimated Creatinine Clearance: 87.2 mL/min (A) (by C-G formula based on SCr of 1.08 mg/dL (H)).    Allergies  Allergen Reactions   Macrobid [Nitrofurantoin] Nausea And Vomiting   Penicillins Rash    14 years ago     Antimicrobials this admission: 11/30 Cefepime >> x 1 11/30 Vancomycin >> 11/30 Zosyn >>  12/1 12/1 Cefepime>>   Microbiology results: No lab cx have been ordered or are pending at this time. 11/30 MRSA PCR pending  Thank you for allowing pharmacy to be a part of this patient's care.  12/30, PharmD, BCPS Clinical Pharmacist 12/11/2020 8:30 AM

## 2020-12-10 NOTE — ED Provider Notes (Signed)
[facility] Emergency Department Provider Note   ____________________________________________   Event Date/Time   First MD Initiated Contact with Patient 12/09/20 2351     (approximate)  I have reviewed the triage vital signs and the nursing notes.   HISTORY  Chief Complaint Wound Infection    HPI Katelyn Barnes is a 40 y.o. female with past medical history of hypertension and diabetes who presents to the ED complaining of wound infection.  Patient reports that she has noticed pain and swelling to the fourth toe of her right foot which has been gradually worsening over about the past 2 weeks.  She states she was prescribed a course of Keflex by urgent care and a course of doxycycline by her PCP with no improvement.  She was seen earlier today at Dr. Excell Seltzer of podiatry's office, subsequently referred to the ED for admission and potential amputation.  She reports pain when bearing weight on her right foot with significant redness and swelling to the fourth toe.  She has also noticed an ulceration to the lateral portion of the toe.  She denies any fevers, nausea, or vomiting.        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   Renal abscess 09/05/2019   Left flank pain    Diabetes mellitus (HCC)    Essential hypertension    Iron deficiency 08/09/2019   Hyperglycemia    Acute respiratory failure (HCC)    Sepsis (HCC) 06/11/2019   Acute renal failure (ARF) (HCC) 06/11/2019   Pyelonephritis 06/11/2019   DKA (diabetic ketoacidoses) 06/11/2019   Obesity hypoventilation syndrome (HCC) 06/11/2019    Severe sepsis with acute organ dysfunction (HCC) 06/11/2019   Radiculopathy 05/05/2016   Morbid obesity with BMI of 40.0-44.9, adult (HCC) 06/27/2014   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   Diabetic neuropathy (HCC) 11/16/2012   PCOS (polycystic ovarian syndrome) 11/16/2012   Bilateral carpal tunnel syndrome 09/12/2012   Right anterior knee pain 12/27/2011   Diabetes mellitus type 2, uncontrolled 06/10/2011   Menorrhagia 06/10/2011   Peripheral edema 06/10/2011   Sciatica 04/27/2011   Dysthymic disorder 08/12/2010   Generalized anxiety disorder 08/12/2010    Past Surgical History:  Procedure Laterality Date   ANTERIOR CERVICAL DECOMP/DISCECTOMY FUSION Right 05/05/2016   Procedure: ANTERIOR CERVICAL DECOMPRESSION FUSION, CERVICAL 7- THORACIC 1 WITH INSTRUMENTATION AND ALLOGRAFT;  Surgeon: Estill Bamberg, MD;  Location: [facility] OR;  Service: Orthopedics;  Laterality: Right;  ANTERIOR CERVICAL DECOMPRESSION FUSION, CERVICAL 7- THORACIC 1 WITH INSTRUMENTATION AND ALLOGRAFT   BACK SURGERY  2016   discectomy    CARPAL TUNNEL RELEASE Bilateral    CYSTOSCOPY W/ RETROGRADES Left 07/06/2019   Procedure: CYSTOSCOPY WITH RETROGRADE PYELOGRAM;  Surgeon: [name], MD;  Location: [facility] ORS;  Service: Urology;  Laterality: Left;   CYSTOSCOPY WITH URETEROSCOPY AND STENT PLACEMENT Left 06/11/2019   Procedure: CYSTOSCOPY WITH URETEROSCOPY AND STENT PLACEMENT;  Surgeon: Jerilee Field, MD;  Location: [facility] OR[facility];  Service: Urology;  Laterality: Left;   CYSTOSCOPY WITH URETEROSCOPY AND STENT PLACEMENT Left 07/06/2019  Procedure: CYSTOSCOPY WITH URETEROSCOPY AND STENT exchange;  Surgeon: [name], MD;  Location: [facility] OR[facility];  Service: Urology;  Laterality: Left;   IR NEPHROSTOMY EXCHANGE LEFT  10/19/2019   LAPAROSCOPIC NEPHRECTOMY, HAND ASSISTED Left 04/04/2020   Procedure: HAND ASSISTED LAPAROSCOPIC NEPHRECTOMY;  Surgeon: [name], MD;  Location: [facility] ORS;  Service: Urology;   Laterality: Left;   SHOULDER ARTHROSCOPY     TUBAL LIGATION  08/2007    Prior to Admission medications   Medication Sig Start Date End Date Taking? Authorizing Provider  atorvastatin (LIPITOR) 40 MG tablet Take 40 mg by mouth.  07/10/19   [provider]  docusate sodium (COLACE) 100 MG capsule Take 1 capsule (100 mg total) by mouth 2 (two) times daily. 04/07/20   [name], MD  HYDROcodone-acetaminophen (NORCO/VICODIN) 5-325 MG tablet Take 1-2 tablets by mouth every 6 (six) hours as needed for moderate pain or severe pain. 06/18/20 06/18/21  Shaune Pollack, MD  insulin isophane & regular human (HUMULIN 70/30 KWIKPEN) (70-30) 100 UNIT/ML KwikPen Inject 40 Units into the skin 2 (two) times daily with a meal.  07/10/19 07/09/20  [provider]  losartan (COZAAR) 50 MG tablet Take 1 tablet (50 mg total) by mouth daily. 09/13/19   Shahmehdi, Gemma Payor, MD  meclizine (ANTIVERT) 25 MG tablet Take 1 tablet (25 mg total) by mouth 3 (three) times daily as needed for dizziness. 07/16/20   Gilles Chiquito, MD  metFORMIN (GLUCOPHAGE) 500 MG tablet Take 500 mg by mouth 2 (two) times daily. 08/04/19   [provider]  methylPREDNISolone (MEDROL DOSEPAK) 4 MG TBPK tablet Take as directed 06/18/20   Shaune Pollack, MD  Multiple Vitamins-Minerals (MULTIVITAMIN WITH MINERALS) tablet Take 1 tablet by mouth daily.    [provider]    Allergies Penicillins and Macrobid [nitrofurantoin]  No family history on file.  Social History Social History   Tobacco Use   Smoking status: Never   Smokeless tobacco: Never  Vaping Use   Vaping Use: Never used  Substance Use Topics   Alcohol use: No   Drug use: No    Review of Systems  Constitutional: No fever/chills Eyes: No visual changes. ENT: No sore throat. Cardiovascular: Denies chest pain. Respiratory: Denies shortness of breath. Gastrointestinal: No abdominal pain.  No nausea, no vomiting.  No diarrhea.  No  constipation. Genitourinary: Negative for dysuria. Musculoskeletal: Negative for back pain.  Positive for pain and swelling of right fourth toe. Skin: Negative for rash.  Positive for toe ulceration. Neurological: Negative for headaches, focal weakness or numbness.  ____________________________________________   PHYSICAL EXAM:  VITAL SIGNS: ED Triage Vitals [12/09/20 1826]  Enc Vitals Group     BP 138/76     Pulse Rate 90     Resp 20     Temp 98.1 F (36.7 C)     Temp Source Oral     SpO2 100 %     Weight 248 lb (112.5 kg)     Height 5\' 6"  (1.676 m)     Head Circumference      Peak Flow      Pain Score 5     Pain Loc      Pain Edu?      Excl. in GC?     Constitutional: Alert and oriented. Eyes: Conjunctivae are normal. Head: Atraumatic. Nose: No congestion/rhinnorhea. Mouth/Throat: Mucous membranes are moist. Neck: Normal ROM Cardiovascular: Normal rate, regular rhythm. Grossly normal heart sounds.  1+ DP pulses  bilaterally. Respiratory: Normal respiratory effort.  No retractions. Lungs CTAB. Gastrointestinal: Soft and nontender. No distention. Genitourinary: deferred Musculoskeletal: Edema and erythema noted to right fourth toe tracking into dorsum of right foot with ulceration noted to lateral portion of right fourth toe. Neurologic:  Normal speech and language. No gross focal neurologic deficits are appreciated. Skin:  Skin is warm, dry and intact. No rash noted. Psychiatric: Mood and affect are normal. Speech and behavior are normal.  ____________________________________________   LABS (all labs ordered are listed, but only abnormal results are displayed)  Labs Reviewed  CBC - Abnormal; Notable for the following components:      Result Value   RBC 3.40 (*)    Hemoglobin 9.7 (*)    HCT 31.3 (*)    All other components within normal limits  BASIC METABOLIC PANEL - Abnormal; Notable for the following components:   Chloride 112 (*)    CO2 19 (*)    BUN 36  (*)    Creatinine, Ser 1.08 (*)    All other components within normal limits  RESP PANEL BY RT-PCR (FLU A&B, COVID) [id number]    PROCEDURES  Procedure(s) performed (including Critical Care):  Procedures   ____________________________________________   INITIAL IMPRESSION / ASSESSMENT AND PLAN / ED COURSE      40 year old female with past medical history of hypertension and diabetes presents to the ED with progressive pain, swelling, and redness affecting her right fourth toe over the past 2 weeks.  Infection has not responded to oral antibiotics and presentation is concerning for osteomyelitis of her right fourth toe.  We will start broad-spectrum antibiotics and further assess with x-ray, labs thus far are unremarkable.  Plan to discuss with hospitalist for admission.  Foot x-ray reviewed by me and shows erosive changes of fourth toe concerning for osteomyelitis.  Patient currently covered with broad-spectrum antibiotics and admission is pending.      ____________________________________________   FINAL CLINICAL IMPRESSION(S) / ED DIAGNOSES  Final diagnoses:  Diabetic foot infection [facility])     ED Discharge Orders     None        Note:  This document was prepared using Dragon voice recognition software and may include unintentional dictation errors.    Chesley Noon, MD 12/10/20 [phone]

## 2020-12-10 NOTE — ED Notes (Signed)
Lab called for lab stick.

## 2020-12-10 NOTE — ED Notes (Signed)
Paige RN aware of assigned bed

## 2020-12-10 NOTE — Progress Notes (Signed)
Pharmacy Antibiotic Note  Katelyn Barnes is a 40 y.o. female admitted on 12/09/2020 with wound infection (diabetic foot).  Pharmacy has been consulted for Zosyn dosing.  Plan: Zosyn 3.375g IV q8h (4 hour infusion).  Pharmacy will continue to follow and adjust dosing when warranted.  Height: 5\' 6"  (167.6 cm) Weight: 112.5 kg (248 lb) IBW/kg (Calculated) : 59.3  Temp (24hrs), Avg:97.4 F (36.3 C), Min:96.7 F (35.9 C), Max:98.1 F (36.7 C)  Recent Labs  Lab 12/09/20 1828  WBC 7.9  CREATININE 1.08*    Estimated Creatinine Clearance: 87.2 mL/min (A) (by C-G formula based on SCr of 1.08 mg/dL (H)).    Allergies  Allergen Reactions   Penicillins Rash    14 years ago    Macrobid [Nitrofurantoin] Nausea And Vomiting    Antimicrobials this admission: 11/30 Cefepime >> x 1 11/30 Vancomycin >> x 1 11/30 Zosyn >>   Microbiology results: No lab cx have been ordered or are pending at this time.  Thank you for allowing pharmacy to be a part of this patient's care.  12/30, PharmD, [facility] 12/10/2020 2:31 AM

## 2020-12-10 NOTE — Progress Notes (Signed)
[facility] Daily Progress Note   Consult received for possible angiogram in the setting of osteomyelitis/chronic wound. ABI results pending. Full consult to follow once ABI complete.  Discussed with Dr. Wallis Mart Jakyria Bleau PA-C 12/10/2020 3:25 PM

## 2020-12-10 NOTE — ED Notes (Signed)
Pt reports pain and swelling to right 4th toe for 2 weeks.  No known injury to toe.  Hx diabetes.   Pt was seen at the urgent care and took 2 abx without improvement.  Pt sent to er for eval of admission and amputation of toe.  Pt alert  speech clear.

## 2020-12-10 NOTE — Consult Note (Addendum)
PODIATRY / FOOT AND ANKLE SURGERY CONSULTATION NOTE  Requesting Physician: Dr. Damita Dunnings  Reason for consult: Right fourth toe infection  Chief Complaint: Right fourth toe redness and swelling, infection   HPI: Minervia Osso is a 40 y.o. female who presents with an infection to the right fourth toe due to open ulceration present.  Patient was seen in our office yesterday and was noted to have a wound that probes to bone as well as increased erythema and edema to the right fourth toe.  Patient has had this ulceration for around a month or so or maybe a little longer.  Patient was given antibiotics by her other physicians consisting of Keflex and then switched to clindamycin and was performing dressing changes to the area daily.  Patient was seen yesterday and noted to have notable drainage from the area as well as increased redness and swelling and bone exposed.  Patient also had a large amount of vascular calcification on x-ray imaging concerning for peripheral vascular disease.  Patient was instructed to go to the emergency room in which she did and now has been admitted to the hospital for further work-up and assessment.  Discussed with patient prior to admission she would likely need right fourth toe amputation.  PMHx:  Past Medical History:  Diagnosis Date   Anemia    iron transfusions x 2   COVID-19    DDD (degenerative disc disease), lumbar    Diabetes mellitus without complication ([place])    Type II   DVT (deep venous thrombosis) ([place]) 2016   post back surgery - was on Eliquis 3 months   Kidney stone     Surgical Hx:  Past Surgical History:  Procedure Laterality Date   ANTERIOR CERVICAL DECOMP/DISCECTOMY FUSION Right 05/05/2016   Procedure: ANTERIOR CERVICAL DECOMPRESSION FUSION, CERVICAL 7- THORACIC 1 WITH INSTRUMENTATION AND ALLOGRAFT;  Surgeon: Phylliss Bob, MD;  Location: [facility];  Service: Orthopedics;  Laterality: Right;  ANTERIOR CERVICAL DECOMPRESSION FUSION, CERVICAL 7-  THORACIC 1 WITH INSTRUMENTATION AND ALLOGRAFT   BACK SURGERY  2016   discectomy    CARPAL TUNNEL RELEASE Bilateral    CYSTOSCOPY W/ RETROGRADES Left 07/06/2019   Procedure: CYSTOSCOPY WITH RETROGRADE PYELOGRAM;  Surgeon: Billey Co, MD;  Location: [facility];  Service: Urology;  Laterality: Left;   CYSTOSCOPY WITH URETEROSCOPY AND STENT PLACEMENT Left 06/11/2019   Procedure: CYSTOSCOPY WITH URETEROSCOPY AND STENT PLACEMENT;  Surgeon: Festus Aloe, MD;  Location: [facility];  Service: Urology;  Laterality: Left;   CYSTOSCOPY WITH URETEROSCOPY AND STENT PLACEMENT Left 07/06/2019   Procedure: CYSTOSCOPY WITH URETEROSCOPY AND STENT exchange;  Surgeon: Billey Co, MD;  Location: [facility];  Service: Urology;  Laterality: Left;   IR NEPHROSTOMY EXCHANGE LEFT  10/19/2019   LAPAROSCOPIC NEPHRECTOMY, HAND ASSISTED Left 04/04/2020   Procedure: HAND ASSISTED LAPAROSCOPIC NEPHRECTOMY;  Surgeon: Billey Co, MD;  Location: [facility];  Service: Urology;  Laterality: Left;   SHOULDER ARTHROSCOPY     TUBAL LIGATION  08/2007    FHx: No family history on file.  Social History:  reports that she has never smoked. She has never used smokeless tobacco. She reports that she does not drink alcohol and does not use drugs.  Allergies:  Allergies  Allergen Reactions   Macrobid [Nitrofurantoin] Nausea And Vomiting   Penicillins Rash    14 years ago     Medications Prior to Admission  Medication Sig Dispense Refill   atorvastatin (LIPITOR) 40 MG tablet Take 40 mg by  mouth daily.     docusate sodium (COLACE) 100 MG capsule Take 1 capsule (100 mg total) by mouth 2 (two) times daily. 10 capsule 0   doxycycline (VIBRAMYCIN) 100 MG capsule Take 100 mg by mouth 2 (two) times daily.     insulin isophane & regular human (HUMULIN 70/30 KWIKPEN) (70-30) 100 UNIT/ML KwikPen Inject 40 Units into the skin 2 (two) times daily with a meal.      losartan (COZAAR) 50 MG tablet Take 1 tablet (50 mg total) by mouth  daily. 30 tablet 0   metFORMIN (GLUCOPHAGE) 500 MG tablet Take 500 mg by mouth 2 (two) times daily.     Multiple Vitamins-Minerals (MULTIVITAMIN WITH MINERALS) tablet Take 1 tablet by mouth daily.     mupirocin ointment (BACTROBAN) 2 % Apply 1 application topically 3 (three) times daily.     terbinafine (LAMISIL) 250 MG tablet Take 250 mg by mouth daily.     topiramate (TOPAMAX) 50 MG tablet Take 150 mg by mouth 2 (two) times daily.     HYDROcodone-acetaminophen (NORCO/VICODIN) 5-325 MG tablet Take 1-2 tablets by mouth every 6 (six) hours as needed for moderate pain or severe pain. (Patient not taking: Reported on 12/10/2020) 12 tablet 0   meclizine (ANTIVERT) 25 MG tablet Take 1 tablet (25 mg total) by mouth 3 (three) times daily as needed for dizziness. 15 tablet 0   methylPREDNISolone (MEDROL DOSEPAK) 4 MG TBPK tablet Take as directed (Patient not taking: Reported on 12/10/2020) 32 tablet 0    Physical Exam: General: Alert and oriented.  No apparent distress.  Vascular: DP/PT pulses +1 bilateral, capillary fill time appears to be intact to digits bilateral, both feet appear to be warm to touch.  Moderate swelling and erythema noted to the right fourth toe, bilateral lower extremity mild nonpitting edema.  Neuro: Light touch sensation grossly reduced to bilateral lower extremities.  Derm: Open ulceration to the lateral aspect of the right fourth PIPJ with bone and tendon exposed, measures approximately 1 cm x 1 cm x 0.4 cm, serous drainage present, associated erythema and edema, mild odor, no proximal streaking past metatarsal phalangeal joint.  MSK: 5/5 strength bilateral lower extremity muscle groups.  Mild hammertoe contractures digits 2 through 5 bilateral.  Results for orders placed or performed during the hospital encounter of 12/09/20 (from the past 48 hour(s))  CBC     Status: Abnormal   Collection Time: 12/09/20  6:28 PM  Result Value Ref Range   WBC 7.9 4.0 - 10.5 K/uL   RBC  3.40 (L) 3.87 - 5.11 MIL/uL   Hemoglobin 9.7 (L) 12.0 - 15.0 g/dL   HCT 31.3 (L) 36.0 - 46.0 %   MCV 92.1 80.0 - 100.0 fL   MCH 28.5 26.0 - 34.0 pg   MCHC 31.0 30.0 - 36.0 g/dL   RDW 13.1 11.5 - 15.5 %   Platelets 228 150 - 400 K/uL   nRBC 0.0 0.0 - 0.2 %    Comment: Performed at [facility], [address], [place], [place] [address]  Basic metabolic panel     Status: Abnormal   Collection Time: 12/09/20  6:28 PM  Result Value Ref Range   Sodium 137 135 - 145 mmol/L   Potassium 3.9 3.5 - 5.1 mmol/L   Chloride 112 (H) 98 - 111 mmol/L   CO2 19 (L) 22 - 32 mmol/L   Glucose, Bld 94 70 - 99 mg/dL    Comment: Glucose reference range applies only  to samples taken after fasting for at least 8 hours.   BUN 36 (H) 6 - 20 mg/dL   Creatinine, Ser 1.08 (H) 0.44 - 1.00 mg/dL   Calcium 9.0 8.9 - 10.3 mg/dL   GFR, Estimated >60 >60 mL/min    Comment: (NOTE) Calculated using the CKD-EPI Creatinine Equation (2021)    Anion gap 6 5 - 15    Comment: Performed at [facility], [address], [place], [place] [address]  Resp Panel by RT-PCR (Flu A&B, Covid) Nasopharyngeal Swab     Status: None   Collection Time: 12/10/20 12:12 AM   Specimen: Nasopharyngeal Swab; Nasopharyngeal(NP) swabs in vial transport medium  Result Value Ref Range   SARS Coronavirus 2 by RT PCR NEGATIVE NEGATIVE    Comment: (NOTE) SARS-CoV-2 target nucleic acids are NOT DETECTED.  The SARS-CoV-2 RNA is generally detectable in upper respiratory specimens during the acute phase of infection. The lowest concentration of SARS-CoV-2 viral copies this assay can detect is 138 copies/mL. A negative result does not preclude SARS-Cov-2 infection and should not be used as the sole basis for treatment or other patient management decisions. A negative result may occur with  improper specimen collection/handling, submission of specimen other than nasopharyngeal swab, presence of viral mutation(s) within the areas  targeted by this assay, and inadequate number of viral copies(<138 copies/mL). A negative result must be combined with clinical observations, patient history, and epidemiological information. The expected result is Negative.  Fact Sheet for Patients:  EntrepreneurPulse.com.au  Fact Sheet for Healthcare Providers:  IncredibleEmployment.be  This test is no t yet approved or cleared by the [place] FDA and  has been authorized for detection and/or diagnosis of SARS-CoV-2 by FDA under an Emergency Use Authorization (EUA). This EUA will remain  in effect (meaning this test can be used) for the duration of the COVID-19 declaration under Section 564(b)(1) of the Act, 21 U.S.C.section 360bbb-3(b)(1), unless the authorization is terminated  or revoked sooner.       Influenza A by PCR NEGATIVE NEGATIVE   Influenza B by PCR NEGATIVE NEGATIVE    Comment: (NOTE) The Xpert Xpress SARS-CoV-2/FLU/RSV plus assay is intended as an aid in the diagnosis of influenza from Nasopharyngeal swab specimens and should not be used as a sole basis for treatment. Nasal washings and aspirates are unacceptable for Xpert Xpress SARS-CoV-2/FLU/RSV testing.  Fact Sheet for Patients: EntrepreneurPulse.com.au  Fact Sheet for Healthcare Providers: IncredibleEmployment.be  This test is not yet approved or cleared by the [place] FDA and has been authorized for detection and/or diagnosis of SARS-CoV-2 by FDA under an Emergency Use Authorization (EUA). This EUA will remain in effect (meaning this test can be used) for the duration of the COVID-19 declaration under Section 564(b)(1) of the Act, 21 U.S.C. section 360bbb-3(b)(1), unless the authorization is terminated or revoked.  Performed at [facility], [address] [place], [place] [address]   CBG monitoring, ED     Status: Abnormal   Collection Time: 12/10/20  2:26  AM  Result Value Ref Range   Glucose-Capillary 44 (LL) 70 - 99 mg/dL    Comment: Glucose reference range applies only to samples taken after fasting for at least 8 hours.   Comment 1 Document in Chart    Comment 2 Repeat Test    Comment 3 Call MD NNP PA CNM   CBG monitoring, ED     Status: Abnormal   Collection Time: 12/10/20  2:28 AM  Result Value Ref  Range   Glucose-Capillary 56 (L) 70 - 99 mg/dL    Comment: Glucose reference range applies only to samples taken after fasting for at least 8 hours.  CBG monitoring, ED     Status: Abnormal   Collection Time: 12/10/20  3:34 AM  Result Value Ref Range   Glucose-Capillary 121 (H) 70 - 99 mg/dL    Comment: Glucose reference range applies only to samples taken after fasting for at least 8 hours.  HIV Antibody (routine testing w rflx)     Status: None   Collection Time: 12/10/20  6:52 AM  Result Value Ref Range   HIV Screen 4th Generation wRfx Non Reactive Non Reactive    Comment: Performed at [facility], [address] [place], [place] [address]  CBG monitoring, ED     Status: Abnormal   Collection Time: 12/10/20  7:34 AM  Result Value Ref Range   Glucose-Capillary 129 (H) 70 - 99 mg/dL    Comment: Glucose reference range applies only to samples taken after fasting for at least 8 hours.  CBG monitoring, ED     Status: Abnormal   Collection Time: 12/10/20  9:03 AM  Result Value Ref Range   Glucose-Capillary 138 (H) 70 - 99 mg/dL    Comment: Glucose reference range applies only to samples taken after fasting for at least 8 hours.  CBG monitoring, ED     Status: Abnormal   Collection Time: 12/10/20 11:58 AM  Result Value Ref Range   Glucose-Capillary 195 (H) 70 - 99 mg/dL    Comment: Glucose reference range applies only to samples taken after fasting for at least 8 hours.   DG Foot 2 Views Right  Result Date: 12/10/2020 CLINICAL DATA:  Infection. EXAM: RIGHT FOOT - 2 VIEW COMPARISON:  None. FINDINGS: There is erosive changes  of the proximal phalanx of the fourth digit which is age indeterminate and may represent sequela of osteomyelitis. A small triangular bone fragment medial to the navicular noted which is also age indeterminate. Correlation with clinical exam and point tenderness recommended. No dislocation. Diffuse subcutaneous edema. IMPRESSION: 1. Erosive changes of the proximal phalanx of the fourth digit, age indeterminate and may represent sequela of osteomyelitis. 2. Small triangular bone fragment medial to the navicular, also age indeterminate. Electronically Signed   By: Anner Crete M.D.   On: 12/10/2020 00:42    Blood pressure (!) 145/81, pulse 85, temperature 97.9 F (36.6 C), resp. rate 16, height $RemoveBe'5\' 6"'[id number]$  (1.676 m), weight 112.5 kg, SpO2 100 %.   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 -X-ray imaging reviewed and discussed with patient detail.  Appears show osteomyelitic changes to the right fourth toe and potentially 4th met head as well as large amount of vascular calcifications. -MRI ordered right foot without contrast to assess 4th MTPJ -Discussed treatment options with patient. -Discussed with patient that she will need to have a right fourth toe amputation during her hospitalization.  We will likely plan for doing this at some point on Friday. -Would like to have vascular assess patient prior to amputation as toe appears to be fairly stable at this time.  Consultation has been placed.  Recommend at minimum ABIs to be performed but if ABIs are abnormal would potentially recommend CT angiogram.  Could potentially perform this on an outpatient basis as patient does not appear to  have vascular compromise today upon examination.  Concern though due to severe vascular calcification seen on x-ray imaging. -Recommend Betadine  wet-to-dry dressings to be placed daily to the area. -Wound culture taken and sent off. -Appreciate recommendations for antibiotic therapy per medicine.  Caroline More, DPM 12/10/2020, 12:46 PM

## 2020-12-11 DIAGNOSIS — M869 Osteomyelitis, unspecified: Secondary | ICD-10-CM

## 2020-12-11 LAB — GLUCOSE, CAPILLARY
Glucose-Capillary: 111 mg/dL — ABNORMAL HIGH (ref 70–99)
Glucose-Capillary: 130 mg/dL — ABNORMAL HIGH (ref 70–99)
Glucose-Capillary: 154 mg/dL — ABNORMAL HIGH (ref 70–99)
Glucose-Capillary: 142 mg/dL — ABNORMAL HIGH (ref 70–99)

## 2020-12-11 LAB — CREATININE, SERUM
Creatinine, Ser: 1.04 mg/dL — ABNORMAL HIGH (ref 0.44–1.00)
GFR, Estimated: >60 mL/min (ref >60)

## 2020-12-11 MED ORDER — NYSTATIN 100000 UNIT/GM EX CREA
TOPICAL_CREAM | Freq: Two times a day (BID) | CUTANEOUS | Status: DC
  Filled 2020-12-11: qty 15

## 2020-12-11 MED ORDER — SODIUM CHLORIDE 0.9 % IV SOLN
2.0000 g | Freq: Three times a day (TID) | INTRAVENOUS | Status: DC
  Administered 2020-12-11 – 2020-12-13 (×7): 2 g via INTRAVENOUS
  Filled 2020-12-11 (×12): qty 2

## 2020-12-11 MED ORDER — FLUCONAZOLE 100 MG PO TABS
100.0000 mg | ORAL_TABLET | Freq: Every day | ORAL | Status: DC
  Administered 2020-12-12 – 2020-12-13 (×2): 100 mg via ORAL
  Filled 2020-12-11 (×3): qty 1

## 2020-12-11 NOTE — Progress Notes (Addendum)
Pt is complaining of itching around labia area. On assessment pt labia was red. NP Jon Billings made aware. Will continue to monitor.  Update 2240: fluconazole (DIFLUCAN) tablet 100 mg daily and nystatin cream (MYCOSTATIN) 2 times daily. Will continue to monitor.

## 2020-12-11 NOTE — Progress Notes (Signed)
Cross Cover Patient started on oral diflucan and nystatin cream for vulvovaginal candidiasis

## 2020-12-11 NOTE — Consult Note (Signed)
[facility] Vascular Consult Note  MRN : [id number]  Katelyn Barnes is a 40 y.o. (1980-01-23) female who presents with chief complaint of  Chief Complaint  Patient presents with   Wound Infection   History of Present Illness:  Katelyn Barnes is a 40 year old female with medical history significant for diabetes, hypertension, class III obesity with obesity hypoventilation syndrome who was sent in for admission by her podiatrist due to concerns for osteomyelitis of the fourth toe of the right foot.  Patient has completed 2 courses of antibiotics with Keflex and doxycycline but continues to have increasing pain now with redness and swelling.  She denies any fevers or chills or nausea and vomiting   X-ray right foot showing erosive changes of the proximal phalanx of the fourth digit which may represent sequela of osteomyelitis.    ABI (12/10/20): 1. The left ankle-brachial index cannot be calculated due to noncompressibility of the arteries. 2. On the right, the ankle-brachial index is calculated at 1.1, however this is likely falsely elevated due to the underlying difficulty with compression. 3. 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  Vascular surgery was consulted by Dr. Luana Shu in the setting of multiple risk factors for atherosclerotic disease and nonhealing wound.  Current Facility-Administered Medications  Medication Dose Route Frequency Provider Last Rate Last Admin   acetaminophen (TYLENOL) tablet 650 mg  650 mg Oral Q6H PRN Athena Masse, MD       Or   acetaminophen (TYLENOL) suppository 650 mg  650 mg Rectal Q6H PRN Athena Masse, MD       atorvastatin (LIPITOR) tablet 40 mg  40 mg Oral Daily Judd Gaudier V, MD   40 mg at 12/11/20 [id number]   ceFEPIme (MAXIPIME) 2 g in sodium chloride 0.9 % 100 mL IVPB  2 g Intravenous Q8H Hallaji, Sheema M, RPH 200 mL/hr at 12/11/20  1136 2 g at 12/11/20 1136   enoxaparin (LOVENOX) injection 57.5 mg  0.5 mg/kg Subcutaneous Q24H Judd Gaudier V, MD   57.5 mg at 12/11/20 0913   HYDROcodone-acetaminophen (NORCO/VICODIN) 5-325 MG per tablet 1-2 tablet  1-2 tablet Oral Q4H PRN Athena Masse, MD       insulin aspart (novoLOG) injection 0-20 Units  0-20 Units Subcutaneous TID WC Athena Masse, MD   4 Units at 12/10/20 1333   insulin aspart (novoLOG) injection 0-5 Units  0-5 Units Subcutaneous QHS Athena Masse, MD       losartan (COZAAR) tablet 50 mg  50 mg Oral Daily Judd Gaudier V, MD   50 mg at 12/11/20 0920   morphine 2 MG/ML injection 2 mg  2 mg Intravenous Q2H PRN Athena Masse, MD       ondansetron [facility]) tablet 4 mg  4 mg Oral Q6H PRN Athena Masse, MD       Or   ondansetron [facility]) injection 4 mg  4 mg Intravenous Q6H PRN Athena Masse, MD       topiramate (TOPAMAX) tablet 150 mg  150 mg Oral BID Lorella Nimrod, MD   150 mg at 12/11/20 0918   vancomycin (VANCOREADY) IVPB 1500 mg/300 mL  1,500 mg Intravenous Q24H Lorna Dibble, RPH 150 mL/hr at 12/11/20 0431 1,500 mg at 12/11/20 0431   Past Medical History:  Diagnosis Date   Anemia    iron transfusions x 2   COVID-19    DDD (  degenerative disc disease), lumbar    Diabetes mellitus without complication ([place])    Type II   DVT (deep venous thrombosis) ([place]) 2016   post back surgery - was on Eliquis 3 months   Kidney stone    Past Surgical History:  Procedure Laterality Date   ANTERIOR CERVICAL DECOMP/DISCECTOMY FUSION Right 05/05/2016   Procedure: ANTERIOR CERVICAL DECOMPRESSION FUSION, CERVICAL 7- THORACIC 1 WITH INSTRUMENTATION AND ALLOGRAFT;  Surgeon: Phylliss Bob, MD;  Location: [place];  Service: Orthopedics;  Laterality: Right;  ANTERIOR CERVICAL DECOMPRESSION FUSION, CERVICAL 7- THORACIC 1 WITH INSTRUMENTATION AND ALLOGRAFT   BACK SURGERY  2016   discectomy    CARPAL TUNNEL RELEASE Bilateral    CYSTOSCOPY W/ RETROGRADES Left 07/06/2019    Procedure: CYSTOSCOPY WITH RETROGRADE PYELOGRAM;  Surgeon: Billey Co, MD;  Location: [facility];  Service: Urology;  Laterality: Left;   CYSTOSCOPY WITH URETEROSCOPY AND STENT PLACEMENT Left 06/11/2019   Procedure: CYSTOSCOPY WITH URETEROSCOPY AND STENT PLACEMENT;  Surgeon: Festus Aloe, MD;  Location: [facility];  Service: Urology;  Laterality: Left;   CYSTOSCOPY WITH URETEROSCOPY AND STENT PLACEMENT Left 07/06/2019   Procedure: CYSTOSCOPY WITH URETEROSCOPY AND STENT exchange;  Surgeon: Billey Co, MD;  Location: [facility];  Service: Urology;  Laterality: Left;   IR NEPHROSTOMY EXCHANGE LEFT  10/19/2019   LAPAROSCOPIC NEPHRECTOMY, HAND ASSISTED Left 04/04/2020   Procedure: HAND ASSISTED LAPAROSCOPIC NEPHRECTOMY;  Surgeon: Billey Co, MD;  Location: [facility];  Service: Urology;  Laterality: Left;   SHOULDER ARTHROSCOPY     TUBAL LIGATION  08/2007   Social History Social History   Tobacco Use   Smoking status: Never   Smokeless tobacco: Never  Vaping Use   Vaping Use: Never used  Substance Use Topics   Alcohol use: No   Drug use: No   Family History History reviewed. No pertinent family history. Denies family history of peripheral artery disease, venous disease or renal disease.  Allergies  Allergen Reactions   Macrobid [Nitrofurantoin] Nausea And Vomiting   Penicillins Rash    14 years ago    REVIEW OF SYSTEMS (Negative unless checked)  Constitutional: [] Weight loss  [] Fever  [] Chills Cardiac: [] Chest pain   [] Chest pressure   [] Palpitations   [] Shortness of breath when laying flat   [] Shortness of breath at rest   [] Shortness of breath with exertion. Vascular:  [] Pain in legs with walking   [] Pain in legs at rest   [] Pain in legs when laying flat   [] Claudication   [] Pain in feet when walking  [] Pain in feet at rest  [] Pain in feet when laying flat   [] History of DVT   [] Phlebitis   [] Swelling in legs   [] Varicose veins   [x] Non-healing ulcers Pulmonary:    [] Uses home oxygen   [] Productive cough   [] Hemoptysis   [] Wheeze  [] COPD   [] Asthma Neurologic:  [] Dizziness  [] Blackouts   [] Seizures   [] History of stroke   [] History of TIA  [] Aphasia   [] Temporary blindness   [] Dysphagia   [] Weakness or numbness in arms   [] Weakness or numbness in legs Musculoskeletal:  [] Arthritis   [] Joint swelling   [] Joint pain   [] Low back pain Hematologic:  [] Easy bruising  [] Easy bleeding   [] Hypercoagulable state   [] Anemic  [] Hepatitis Gastrointestinal:  [] Blood in stool   [] Vomiting blood  [] Gastroesophageal reflux/heartburn   [] Difficulty swallowing. Genitourinary:  [x] Chronic kidney disease   [] Difficult urination  [] Frequent urination  [] Burning with urination   []   Blood in urine Skin:  [] Rashes   [x] Ulcers   [x] Wounds Psychological:  [] History of anxiety   []  History of major depression.  Physical Examination  Vitals:   12/10/20 2359 12/11/20 0529 12/11/20 0732 12/11/20 1018  BP: 121/70 111/66 117/72 132/81  Pulse: 80 73 72 74  Resp: 17 18 18 18   Temp:  98 F (36.7 C) 98.3 F (36.8 C) 98.3 F (36.8 C)  TempSrc:   Temporal Oral  SpO2: 99% 100% 100% 100%  Weight:      Height:       Body mass index is 40.03 kg/m. Gen:  WD/WN, NAD Head: [place]/AT, No temporalis wasting. Prominent temp pulse not noted. Ear/Nose/Throat: Hearing grossly intact, nares w/o erythema or drainage, oropharynx w/o Erythema/Exudate Eyes: Sclera non-icteric, conjunctiva clear Neck: Trachea midline.  No JVD.  Pulmonary:  Good air movement, respirations not labored, equal bilaterally.  Cardiac: RRR, normal S1, S2. Vascular:  Vessel Right Left  Radial Palpable Palpable  Ulnar Palpable Palpable  Brachial Palpable Palpable  Carotid Palpable, without bruit Palpable, without bruit  Aorta Not palpable N/A  Femoral Palpable Palpable  Popliteal Palpable Palpable  PT Faint Palpable  DP Faint Palpable   Right lower extremity: Thigh soft.  Calf soft.  Ulceration noted to the right  fourth toe with surrounding erythema.  Faintly palpable pulses.  Warm.  Gastrointestinal: soft, non-tender/non-distended. No guarding/reflex.  Musculoskeletal: M/S 5/5 throughout.  Extremities without ischemic changes.  No deformity or atrophy. No edema. Neurologic: Sensation grossly intact in extremities.  Symmetrical.  Speech is fluent. Motor exam as listed above. Psychiatric: Judgment intact, Mood & affect appropriate for pt's clinical situation. Dermatologic: As above  Lymph : No Cervical, Axillary, or Inguinal lymphadenopathy.  CBC Lab Results  Component Value Date   WBC 7.9 12/09/2020   HGB 9.7 (L) 12/09/2020   HCT 31.3 (L) 12/09/2020   MCV 92.1 12/09/2020   PLT 228 12/09/2020   BMET    Component Value Date/Time   NA 137 12/09/2020 1828   NA 136 05/27/2013 1839   K 3.9 12/09/2020 1828   K 3.8 05/27/2013 1839   CL 112 (H) 12/09/2020 1828   CL 106 05/27/2013 1839   CO2 19 (L) 12/09/2020 1828   CO2 22 05/27/2013 1839   GLUCOSE 94 12/09/2020 1828   GLUCOSE 289 (H) 05/27/2013 1839   BUN 36 (H) 12/09/2020 1828   BUN 9 05/27/2013 1839   CREATININE 1.04 (H) 12/11/2020 0336   CREATININE 0.90 05/27/2013 1839   CALCIUM 9.0 12/09/2020 1828   CALCIUM 8.7 05/27/2013 1839   GFRNONAA >60 12/11/2020 0336   GFRNONAA >60 05/27/2013 1839   GFRAA >60 09/12/2019 0509   GFRAA >60 05/27/2013 1839   Estimated Creatinine Clearance: 90.6 mL/min (A) (by C-G formula based on SCr of 1.04 mg/dL (H)).  COAG Lab Results  Component Value Date   INR 1.3 (H) 09/06/2019   INR 1.6 (H) 06/12/2019   INR 1.06 05/03/2016   Radiology MR FOOT RIGHT WO CONTRAST  Result Date: 12/11/2020 CLINICAL DATA:  Wound infection with swelling in the fourth toe including lateral ulceration. EXAM: MRI OF THE RIGHT FOREFOOT WITHOUT CONTRAST TECHNIQUE: Multiplanar, multisequence MR imaging of the right forefoot was performed. No intravenous contrast was administered. COMPARISON:  Radiographs 12/10/2020 FINDINGS:  Bones/Joint/Cartilage There is active osteomyelitis of the proximal and middle phalanges of the fourth toe. Lesser edema involves the distal phalanx and the head and proximal shaft of the fourth metatarsal which could also be from early  osteomyelitis. Tapered appearance of the proximal phalanx distally along with cortical demineralization suggesting bony destructive findings corroborating the appearance on CT. On STIR image 3 of series 10 there is questionable edema signal in the distal phalanx of the small toe but this is not corroborated on other imaging planes are sequences and is probably due to volume averaging rather than actual marrow edema. There is some faint marrow edema in the proximal metaphysis of the fifth metatarsal on image 17 series 9, nonspecific but unlikely to be infectious, possibly low-grade stress reaction or from disuse. Ligaments The Lisfranc ligament appears intact. Muscles and Tendons The diffuse nature of the regional muscular edema favors neurogenic edema over myositis although the latter is not completely excluded. Soft tissues Mild intermetatarsal bursitis between the heads of the first and second metatarsals on image twenty-two series 6. Dorsal subcutaneous edema in the foot is nonspecific but may reflect cellulitis. Substantial subcutaneous edema in the fourth toe. IMPRESSION: 1. Active osteomyelitis of the proximal and middle phalanges of the fourth toe. Lesser edema in the distal phalanx and the head of the fourth metatarsal and could be reactive or from early osteomyelitis as well. Substantial subcutaneous edema favoring cellulitis in the fourth toe. 2. Diffuse regional muscular edema favoring neurogenic edema. 3. Mild intermetatarsal bursitis between the heads of the first and second metatarsals. 4. Dorsal subcutaneous edema in the foot is nonspecific but may reflect cellulitis. 5. Subtle marrow edema in the proximal metaphysis of the fifth metatarsal, nonspecific but probably  incidental. Electronically Signed   By: Van Clines M.D.   On: 12/11/2020 08:37   US ARTERIAL ABI (SCREENING LOWER EXTREMITY)  Result Date: 12/11/2020 CLINICAL DATA:  Right fourth digit wound EXAM: NONINVASIVE PHYSIOLOGIC VASCULAR STUDY OF BILATERAL LOWER EXTREMITIES TECHNIQUE: Evaluation of both lower extremities were performed at rest, including calculation of ankle-brachial indices with single level Doppler, pressure and pulse volume recording. COMPARISON:  None. FINDINGS: Right ABI:  1.12 Left ABI:  Unable to calculate Right Lower Extremity: Minimally abnormal triphasic arterial waveforms in the posterior tibial artery. However, the dorsalis pedis waveform is abnormal and monophasic suggesting proximal occlusive disease. Left Lower Extremity:  Relatively normal arterial waveforms. 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 The left ankle-brachial index cannot be calculated due to noncompressibility of the arteries. 2. On the right, the ankle-brachial index is calculated at 1.1, however this is likely falsely elevated due to the underlying difficulty with compression. 3. 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 Signed, Criselda Peaches, MD, [facility] Radiology Electronically Signed   By: Jacqulynn Cadet M.D.   On: 12/11/2020 07:49   DG Foot 2 Views Right  Result Date: 12/10/2020 CLINICAL DATA:  Infection. EXAM: RIGHT FOOT - 2 VIEW COMPARISON:  None. FINDINGS: There is erosive changes of the proximal phalanx of the fourth digit which is age indeterminate and may represent sequela of osteomyelitis. A small triangular bone fragment medial to the navicular noted which is also age indeterminate. Correlation with clinical exam and point tenderness recommended. No dislocation. Diffuse  subcutaneous edema. IMPRESSION: 1. Erosive changes of the proximal phalanx of the fourth digit, age indeterminate and may represent sequela of osteomyelitis. 2. Small triangular bone fragment medial to the navicular, also age indeterminate. Electronically Signed   By: Anner Crete M.D.   On: 12/10/2020 00:42    Assessment/Plan Marita [place] Neveen Ulrick is  a 40 year old female with medical history significant for diabetes, hypertension, class III obesity with obesity hypoventilation syndrome who was sent in for admission by her podiatrist due to concerns for osteomyelitis of the fourth toe of the right foot.  1.  Chronic wound to the right foot: Faintly palpable pulses on exam.  ABIs noncompressible with what looks like monophasic signals distally.  If the patient does not have robust blood flow during her toe amputation with podiatry we will plan on moving forward with an angiogram most likely on Monday with Dr. Lucky Cowboy.  I reviewed with an angiogram was with the patient and her family member at the bedside.  Procedure, risks and benefits were explained.  All questions were answered.  Patient wishes to proceed on Monday if no robust blood flow was noted during surgery.  2.  Diabetes: On appropriate medications in the outpatient setting. Unsure if compliant. Encouraged good control as its slows the progression of atherosclerotic disease.   3.  Hyperlipidemia: On Statin for medical management Consider adding ASA - when cleared by podiatry  Encouraged good control as its slows the progression of atherosclerotic disease   Discussed with Dr. Mayme Genta, PA-C 12/11/2020 12:12 PM  This note was created with Dragon medical transcription system.  Any error is purely unintentional.

## 2020-12-11 NOTE — Progress Notes (Signed)
Daily Progress Note   Subjective  - * No surgery date entered *  Follow-up right fourth toe infection.  Objective Vitals:   12/10/20 2359 12/11/20 0529 12/11/20 0732 12/11/20 1018  BP: 121/70 111/66 117/72 132/81  Pulse: 80 73 72 74  Resp: 17 18 18 18   Temp:  98 F (36.7 C) 98.3 F (36.8 C) 98.3 F (36.8 C)  TempSrc:   Temporal Oral  SpO2: 99% 100% 100% 100%  Weight:      Height:        Physical Exam: Open wound to the lateral aspect of the right fourth toe.  X-ray shows infection of the toe itself.  MRI with osteomyelitis.  Area of edema on the fourth metatarsal  Laboratory CBC    Component Value Date/Time   WBC 7.9 12/09/2020 1828   HGB 9.7 (L) 12/09/2020 1828   HGB 14.0 05/27/2013 1839   HCT 31.3 (L) 12/09/2020 1828   HCT 42.2 05/27/2013 1839   PLT 228 12/09/2020 1828   PLT 168 05/27/2013 1839    BMET    Component Value Date/Time   NA 137 12/09/2020 1828   NA 136 05/27/2013 1839   K 3.9 12/09/2020 1828   K 3.8 05/27/2013 1839   CL 112 (H) 12/09/2020 1828   CL 106 05/27/2013 1839   CO2 19 (L) 12/09/2020 1828   CO2 22 05/27/2013 1839   GLUCOSE 94 12/09/2020 1828   GLUCOSE 289 (H) 05/27/2013 1839   BUN 36 (H) 12/09/2020 1828   BUN 9 05/27/2013 1839   CREATININE 1.04 (H) 12/11/2020 0336   CREATININE 0.90 05/27/2013 1839   CALCIUM 9.0 12/09/2020 1828   CALCIUM 8.7 05/27/2013 1839   GFRNONAA >60 12/11/2020 0336   GFRNONAA >60 05/27/2013 1839   GFRAA >60 09/12/2019 0509   GFRAA >60 05/27/2013 1839    Assessment/Planning: Osteomyelitis right fourth toe  At this point the plan is for amputation of the fourth toe with evaluation and possible removal of distal fourth metatarsal head.  I discussed this with the patient in great detail today.  She expressed understanding and has given consent. Orders will be placed.  If there is minimal bleeding the plan would be for vascular intervention starting Monday.  She has been seen and worked up by vascular  surgery.  Saturday A  12/11/2020, 1:09 PM

## 2020-12-11 NOTE — Progress Notes (Signed)
PROGRESS NOTE    Katelyn Barnes  [id number] DOB: November 17, 1980 DOA: 12/09/2020 PCP: [name], PA   Brief Narrative: Taken from H&P. Katelyn Barnes is a 40 y.o. female with medical history significant for Diabetes, hypertension, class III obesity with obesity hypoventilation syndrome who was sent in for admission by her podiatrist due to concerns for osteomyelitis of the fourth toe of the right foot.  Patient has completed 2 courses of antibiotics with Keflex and doxycycline but continues to have increasing pain now with redness and swelling.  X-ray concerning for osteomyelitis. Podiatry consulted vascular surgery for angiography, ABI results pending. She was started on Zosyn and vancomycin, antibiotic switched to cefepime and vancomycin. MRI ordered by podiatry shows osteomyelitis of proximal and middle phalanx of fourth toe with some associated neurogenic edema and mild intermetatarsal bursitis between the heads of first and second metatarsals.  There was also some subtle marrow edema in the proximal metaphases of the fifth metatarsal which can be nonspecific but probably incidental.  Patient is going for angiography due to abnormal ABI results with vascular surgery on Monday before proceeding for amputation.  Subjective: Patient continues to have 5/10 pain in the right foot.  She was quite tearful because of her upcoming procedures.  Counseling and reassurance was provided.  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  Patient will need lower extremity angiography before proceeding with amputation.  MRI with osteomyelitis, see full report.  ABI done-shows noncompressible  arteries.  Going for angiography and possible intervention with vascular surgery on Monday before proceeding with amputation.  Initially received Zosyn and vancomycin, Zosyn was later switched with cefepime. -Continue with cefepime and vancomycin for now.  Class III obesity. Estimated body mass index is 40.03 kg/m as calculated from the following:   Height as of this encounter: 5\' 6"  (1.676 m).   Weight as of this encounter: 112.5 kg.   Type 2 diabetes mellitus. -Continue with SSI  Essential hypertension.  Blood pressure within goal. -Continue with home dose of losartan  Objective: Vitals:   12/11/20 0529 12/11/20 0732 12/11/20 1018 12/11/20 1537  BP: 111/66 117/72 132/81 (!) 110/59  Pulse: 73 72 74 85  Resp: 18 18 18 18   Temp: 98 F (36.7 C) 98.3 F (36.8 C) 98.3 F (36.8 C) 98.1 F (36.7 C)  TempSrc:  Temporal Oral   SpO2: 100% 100% 100% 100%  Weight:      Height:        Intake/Output Summary (Last 24 hours) at 12/11/2020 1635 Last data filed at 12/11/2020 1409 Gross per 24 hour  Intake 580 ml  Output --  Net 580 ml    Filed Weights   12/09/20 1826  Weight: 112.5 kg    Examination:  General.  Well-developed lady, in no acute distress. Pulmonary.  Lungs clear bilaterally, normal respiratory effort. CV.  Regular rate and rhythm, no JVD, rub or murmur. Abdomen.  Soft, nontender, nondistended, BS positive. CNS.  Alert and oriented x3.  No focal neurologic deficit. Extremities.  Right fourth toe erythema with surrounding edema. Psychiatry.  Judgment and insight appears normal.    DVT prophylaxis: Lovenox Code Status: Full Family Communication: Discussed with patient Disposition Plan:  Status is: Inpatient  Remains inpatient appropriate because: Severity of illness   Level of care: Med-Surg  All the records are reviewed and case discussed with Care Management/Social Worker. Management plans discussed with the patient, nursing and they are in  agreement.  Consultants:  Podiatry Vascular surgery  Procedures:  Antimicrobials:  Cefepime Vancomycin  Data Reviewed: I have personally reviewed following labs and imaging studies  CBC: Recent Labs  Lab 12/09/20 1828  WBC 7.9  HGB 9.7*  HCT 31.3*  MCV 92.1  PLT [id number]    Basic Metabolic Panel: Recent Labs  Lab 12/09/20 1828 12/11/20 0336  NA 137  --   K 3.9  --   CL 112*  --   CO2 19*  --   GLUCOSE 94  --   BUN 36*  --   CREATININE 1.08* 1.04*  CALCIUM 9.0  --     GFR: Estimated Creatinine Clearance: 90.6 mL/min (A) (by C-G formula based on SCr of 1.04 mg/dL (H)). Liver Function Tests: No results for input(s): AST, ALT, ALKPHOS, BILITOT, PROT, ALBUMIN in the last 168 hours. No results for input(s): LIPASE, AMYLASE in the last 168 hours. No results for input(s): AMMONIA in the last 168 hours. Coagulation Profile: No results for input(s): INR, PROTIME in the last 168 hours. Cardiac Enzymes: No results for input(s): CKTOTAL, CKMB, CKMBINDEX, TROPONINI in the last 168 hours. BNP (last 3 results) No results for input(s): PROBNP in the last 8760 hours. HbA1C: Recent Labs    12/09/20 1828  HGBA1C 7.5*   CBG: Recent Labs  Lab 12/10/20 1615 12/10/20 2108 12/11/20 0741 12/11/20 1229 12/11/20 1619  GLUCAP 96 114* 111* 130* 154*    Lipid Profile: No results for input(s): CHOL, HDL, LDLCALC, TRIG, CHOLHDL, LDLDIRECT in the last 72 hours. Thyroid Function Tests: No results for input(s): TSH, T4TOTAL, FREET4, T3FREE, THYROIDAB in the last 72 hours. Anemia Panel: No results for input(s): VITAMINB12, FOLATE, FERRITIN, TIBC, IRON, RETICCTPCT in the last 72 hours. Sepsis Labs: No results for input(s): PROCALCITON, LATICACIDVEN in the last 168 hours.  Recent Results (from the past 240 hour(s))  Resp Panel by RT-PCR (Flu A&B, Covid) Nasopharyngeal Swab     Status: None   Collection Time: 12/10/20 12:12 AM   Specimen: Nasopharyngeal Swab; Nasopharyngeal(NP)  swabs in vial transport medium  Result Value Ref Range Status   SARS Coronavirus 2 by RT PCR NEGATIVE NEGATIVE Final    Comment: (NOTE) SARS-CoV-2 target nucleic acids are NOT DETECTED.  The SARS-CoV-2 RNA is generally detectable in upper respiratory specimens during the acute phase of infection. The lowest concentration of SARS-CoV-2 viral copies this assay can detect is 138 copies/mL. A negative result does not preclude SARS-Cov-2 infection and should not be used as the sole basis for treatment or other patient management decisions. A negative result may occur with  improper specimen collection/handling, submission of specimen other than nasopharyngeal swab, presence of viral mutation(s) within the areas targeted by this assay, and inadequate number of viral copies(<138 copies/mL). A negative result must be combined with clinical observations, patient history, and epidemiological information. The expected result is Negative.  Fact Sheet for Patients:  EntrepreneurPulse.com.au  Fact Sheet for Healthcare Providers:  IncredibleEmployment.be  This test is no t yet approved or cleared by the [place] FDA and  has been authorized for detection and/or diagnosis of SARS-CoV-2 by FDA under an Emergency Use Authorization (EUA). This EUA will remain  in effect (meaning this test can be used) for the duration  of the COVID-19 declaration under Section 564(b)(1) of the Act, 21 U.S.C.section 360bbb-3(b)(1), unless the authorization is terminated  or revoked sooner.       Influenza A by PCR NEGATIVE NEGATIVE Final   Influenza B by PCR NEGATIVE NEGATIVE Final    Comment: (NOTE) The Xpert Xpress SARS-CoV-2/FLU/RSV plus assay is intended as an aid in the diagnosis of influenza from Nasopharyngeal swab specimens and should not be used as a sole basis for treatment. Nasal washings and aspirates are unacceptable for Xpert Xpress  SARS-CoV-2/FLU/RSV testing.  Fact Sheet for Patients: BloggerCourse.com  Fact Sheet for Healthcare Providers: SeriousBroker.it  This test is not yet approved or cleared by the [place] FDA and has been authorized for detection and/or diagnosis of SARS-CoV-2 by FDA under an Emergency Use Authorization (EUA). This EUA will remain in effect (meaning this test can be used) for the duration of the COVID-19 declaration under Section 564(b)(1) of the Act, 21 U.S.C. section 360bbb-3(b)(1), unless the authorization is terminated or revoked.  Performed at [facility], [address] [place], [place] [address]   MRSA Next Gen by PCR, Nasal     Status: None   Collection Time: 12/10/20 12:24 PM   Specimen: Nasal Mucosa; Nasal Swab  Result Value Ref Range Status   MRSA by PCR Next Gen NOT DETECTED NOT DETECTED Final    Comment: (NOTE) The GeneXpert MRSA Assay (FDA approved for NASAL specimens only), is one component of a comprehensive MRSA colonization surveillance program. It is not intended to diagnose MRSA infection nor to guide or monitor treatment for MRSA infections. Test performance is not FDA approved in patients less than 42 years old. Performed at [facility], [address] [place], [place] [address]   Aerobic/Anaerobic Culture w Gram Stain (surgical/deep wound)     Status: None (Preliminary result)   Collection Time: 12/10/20  1:02 PM   Specimen: Toe; Wound  Result Value Ref Range Status   Specimen Description   Final    TOE Performed at [facility], [address], [place], [place] [address]    Special Requests   Final    NONE Performed at [facility], [address] [place], [place] [address]    Gram Stain   Final    NO SQUAMOUS EPITHELIAL CELLS SEEN FEW WBC SEEN FEW GRAM NEGATIVE RODS FEW GRAM POSITIVE COCCI    Culture   Final    NO GROWTH < 12 HOURS Performed at  [facility], [address] [place], [place] [address]    Report Status PENDING  Incomplete      Radiology Studies: MR FOOT RIGHT WO CONTRAST  Result Date: 12/11/2020 CLINICAL DATA:  Wound infection with swelling in the fourth toe including lateral ulceration. EXAM: MRI OF THE RIGHT FOREFOOT WITHOUT CONTRAST TECHNIQUE: Multiplanar, multisequence MR imaging of the right forefoot was performed. No intravenous contrast was administered. COMPARISON:  Radiographs 12/10/2020 FINDINGS: Bones/Joint/Cartilage There is active osteomyelitis of the proximal and middle phalanges of the fourth toe. Lesser edema involves the distal phalanx and the head and proximal shaft of the fourth metatarsal which could also be from early osteomyelitis. Tapered appearance of the proximal phalanx distally along with cortical demineralization suggesting bony destructive findings corroborating the appearance on CT. On STIR image 3 of series 10 there is questionable edema signal in the distal phalanx of the small toe but this is not corroborated on other imaging planes are sequences and is probably due to volume  averaging rather than actual marrow edema. There is some faint marrow edema in the proximal metaphysis of the fifth metatarsal on image 17 series 9, nonspecific but unlikely to be infectious, possibly low-grade stress reaction or from disuse. Ligaments The Lisfranc ligament appears intact. Muscles and Tendons The diffuse nature of the regional muscular edema favors neurogenic edema over myositis although the latter is not completely excluded. Soft tissues Mild intermetatarsal bursitis between the heads of the first and second metatarsals on image twenty-two series 6. Dorsal subcutaneous edema in the foot is nonspecific but may reflect cellulitis. Substantial subcutaneous edema in the fourth toe. IMPRESSION: 1. Active osteomyelitis of the proximal and middle phalanges of the fourth toe. Lesser edema in the distal phalanx  and the head of the fourth metatarsal and could be reactive or from early osteomyelitis as well. Substantial subcutaneous edema favoring cellulitis in the fourth toe. 2. Diffuse regional muscular edema favoring neurogenic edema. 3. Mild intermetatarsal bursitis between the heads of the first and second metatarsals. 4. Dorsal subcutaneous edema in the foot is nonspecific but may reflect cellulitis. 5. Subtle marrow edema in the proximal metaphysis of the fifth metatarsal, nonspecific but probably incidental. Electronically Signed   By: Van Clines M.D.   On: 12/11/2020 08:37   US ARTERIAL ABI (SCREENING LOWER EXTREMITY)  Result Date: 12/11/2020 CLINICAL DATA:  Right fourth digit wound EXAM: NONINVASIVE PHYSIOLOGIC VASCULAR STUDY OF BILATERAL LOWER EXTREMITIES TECHNIQUE: Evaluation of both lower extremities were performed at rest, including calculation of ankle-brachial indices with single level Doppler, pressure and pulse volume recording. COMPARISON:  None. FINDINGS: Right ABI:  1.12 Left ABI:  Unable to calculate Right Lower Extremity: Minimally abnormal triphasic arterial waveforms in the posterior tibial artery. However, the dorsalis pedis waveform is abnormal and monophasic suggesting proximal occlusive disease. Left Lower Extremity:  Relatively normal arterial waveforms. 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 The left ankle-brachial index cannot be calculated due to noncompressibility of the arteries. 2. On the right, the ankle-brachial index is calculated at 1.1, however this is likely falsely elevated due to the underlying difficulty with compression. 3. 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 Signed, Criselda Peaches, MD, [facility] Electronically Signed   By: Jacqulynn Cadet M.D.   On: 12/11/2020 07:49   DG Foot 2 Views Right  Result Date: 12/10/2020 CLINICAL DATA:  Infection. EXAM: RIGHT FOOT - 2 VIEW COMPARISON:  None. FINDINGS: There is erosive changes of the proximal phalanx of the fourth digit which is age indeterminate and may represent sequela of osteomyelitis. A small triangular bone fragment medial to the navicular noted which is also age indeterminate. Correlation with clinical exam and point tenderness recommended. No dislocation. Diffuse subcutaneous edema. IMPRESSION: 1. Erosive changes of the proximal phalanx of the fourth digit, age indeterminate and may represent sequela of osteomyelitis. 2. Small triangular bone fragment medial to the navicular, also age indeterminate. Electronically Signed   By: Anner Crete M.D.   On: 12/10/2020 00:42    Scheduled Meds:  atorvastatin  40 mg Oral Daily   enoxaparin (LOVENOX) injection  0.5 mg/kg Subcutaneous Q24H   insulin aspart  0-20 Units Subcutaneous TID WC   insulin aspart  0-5 Units Subcutaneous QHS   losartan  50 mg Oral Daily   topiramate  150 mg Oral BID   Continuous Infusions:  ceFEPime (MAXIPIME) IV Stopped (  12/11/20 1206)   vancomycin 1,500 mg (12/11/20 0431)     LOS: 1 day   Time spent: 38 minutes. More than 50% of the time was spent in counseling/coordination of care  Lorella Nimrod, MD [facility]  If 7PM-7AM, please contact night-coverage [email or web address]  12/11/2020, 4:35 PM   This record has been created using Systems analyst. Errors have been sought and corrected,but may not always be located. Such creation errors do not reflect on the standard of care.

## 2020-12-12 ENCOUNTER — Encounter
Admission: EM | Disposition: A | Discharge status: Home / Self Care | Payer: Self-pay | Source: Home / Self Care | Attending: Internal Medicine

## 2020-12-12 ENCOUNTER — Inpatient Hospital Stay: Payer: Medicaid Other | Admitting: Anesthesiology

## 2020-12-12 ENCOUNTER — Inpatient Hospital Stay: Payer: Medicaid Other

## 2020-12-12 HISTORY — PX: BONE BIOPSY: SHX375

## 2020-12-12 HISTORY — PX: AMPUTATION: SHX166

## 2020-12-12 LAB — GLUCOSE, CAPILLARY
Glucose-Capillary: 155 mg/dL — ABNORMAL HIGH (ref 70–99)
Glucose-Capillary: 122 mg/dL — ABNORMAL HIGH (ref 70–99)
Glucose-Capillary: 138 mg/dL — ABNORMAL HIGH (ref 70–99)
Glucose-Capillary: 143 mg/dL — ABNORMAL HIGH (ref 70–99)

## 2020-12-12 SURGERY — AMPUTATION, FOOT, RAY
Anesthesia: General | Site: Fourth Toe | Laterality: Right

## 2020-12-12 MED ORDER — LACTATED RINGERS IV SOLN: INTRAVENOUS | Status: DC

## 2020-12-12 MED ORDER — KETAMINE HCL 10 MG/ML IJ SOLN
INTRAMUSCULAR | Status: DC | PRN
  Administered 2020-12-12: 40 mg via INTRAVENOUS
  Administered 2020-12-12: 10 mg via INTRAVENOUS

## 2020-12-12 MED ORDER — MIDAZOLAM HCL 2 MG/2ML IJ SOLN
INTRAMUSCULAR | Status: AC
  Filled 2020-12-12: qty 2

## 2020-12-12 MED ORDER — MIDAZOLAM HCL 2 MG/2ML IJ SOLN
INTRAMUSCULAR | Status: DC | PRN
  Administered 2020-12-12: 2 mg via INTRAVENOUS

## 2020-12-12 MED ORDER — OXYCODONE HCL 5 MG PO TABS
ORAL_TABLET | ORAL | Status: AC
  Filled 2020-12-12: qty 1

## 2020-12-12 MED ORDER — NEOMYCIN-POLYMYXIN B GU 40-200000 IR SOLN
Status: AC
  Filled 2020-12-12: qty 2

## 2020-12-12 MED ORDER — PROPOFOL 500 MG/50ML IV EMUL
INTRAVENOUS | Status: AC
  Filled 2020-12-12: qty 50

## 2020-12-12 MED ORDER — ORAL CARE MOUTH RINSE: 15.0000 mL | Freq: Once | OROMUCOSAL | Status: AC

## 2020-12-12 MED ORDER — ONDANSETRON HCL 4 MG/2ML IJ SOLN
INTRAMUSCULAR | Status: AC
  Filled 2020-12-12: qty 2

## 2020-12-12 MED ORDER — SODIUM CHLORIDE 0.9 % IR SOLN: Status: DC | PRN

## 2020-12-12 MED ORDER — PROPOFOL 500 MG/50ML IV EMUL
INTRAVENOUS | Status: DC | PRN
  Administered 2020-12-12: 150 ug/kg/min via INTRAVENOUS

## 2020-12-12 MED ORDER — CHLORHEXIDINE GLUCONATE 4 % EX LIQD: 60.0000 mL | Freq: Once | CUTANEOUS | Status: DC

## 2020-12-12 MED ORDER — EPHEDRINE SULFATE 50 MG/ML IJ SOLN
INTRAMUSCULAR | Status: DC | PRN
  Administered 2020-12-12 (×2): 5 mg via INTRAVENOUS
  Administered 2020-12-12: 10 mg via INTRAVENOUS

## 2020-12-12 MED ORDER — LIDOCAINE HCL (CARDIAC) PF 100 MG/5ML IV SOSY
PREFILLED_SYRINGE | INTRAVENOUS | Status: DC | PRN
  Administered 2020-12-12: 60 mg via INTRAVENOUS

## 2020-12-12 MED ORDER — ACETAMINOPHEN 10 MG/ML IV SOLN
INTRAVENOUS | Status: AC
  Filled 2020-12-12: qty 100

## 2020-12-12 MED ORDER — BUPIVACAINE HCL (PF) 0.5 % IJ SOLN
INTRAMUSCULAR | Status: AC
  Filled 2020-12-12: qty 30

## 2020-12-12 MED ORDER — CHLORHEXIDINE GLUCONATE 0.12 % MT SOLN: 15.0000 mL | Freq: Once | OROMUCOSAL | Status: AC

## 2020-12-12 MED ORDER — FENTANYL CITRATE (PF) 100 MCG/2ML IJ SOLN
INTRAMUSCULAR | Status: DC | PRN
  Administered 2020-12-12 (×2): 25 ug via INTRAVENOUS
  Administered 2020-12-12: 50 ug via INTRAVENOUS

## 2020-12-12 MED ORDER — OXYCODONE HCL 5 MG/5ML PO SOLN: 5.0000 mg | Freq: Once | ORAL | Status: AC | PRN

## 2020-12-12 MED ORDER — CHLORHEXIDINE GLUCONATE 0.12 % MT SOLN
OROMUCOSAL | Status: AC
  Administered 2020-12-12: 15 mL via OROMUCOSAL
  Filled 2020-12-12: qty 15

## 2020-12-12 MED ORDER — ACETAMINOPHEN 10 MG/ML IV SOLN
INTRAVENOUS | Status: DC | PRN
Start: 2020-12-12 — End: 2020-12-12
  Administered 2020-12-12: 1000 mg via INTRAVENOUS

## 2020-12-12 MED ORDER — BUPIVACAINE HCL 0.5 % IJ SOLN
INTRAMUSCULAR | Status: DC | PRN
  Administered 2020-12-12: 20 mL

## 2020-12-12 MED ORDER — PROMETHAZINE HCL 25 MG/ML IJ SOLN: 6.2500 mg | INTRAMUSCULAR | Status: DC | PRN

## 2020-12-12 MED ORDER — POVIDONE-IODINE 10 % EX SWAB
2.0000 "application " | Freq: Once | CUTANEOUS | Status: AC
  Administered 2020-12-12: 2 via TOPICAL

## 2020-12-12 MED ORDER — VANCOMYCIN HCL 1000 MG IV SOLR
INTRAVENOUS | Status: DC | PRN
  Administered 2020-12-12: 1000 mg via TOPICAL

## 2020-12-12 MED ORDER — OXYCODONE HCL 5 MG PO TABS
5.0000 mg | ORAL_TABLET | Freq: Once | ORAL | Status: AC | PRN
  Administered 2020-12-12: 5 mg via ORAL

## 2020-12-12 MED ORDER — FENTANYL CITRATE (PF) 100 MCG/2ML IJ SOLN
INTRAMUSCULAR | Status: AC
  Filled 2020-12-12: qty 2

## 2020-12-12 MED ORDER — VANCOMYCIN HCL 1000 MG IV SOLR
INTRAVENOUS | Status: AC
  Filled 2020-12-12: qty 20

## 2020-12-12 MED ORDER — LIDOCAINE HCL (PF) 2 % IJ SOLN
INTRAMUSCULAR | Status: AC
  Filled 2020-12-12: qty 5

## 2020-12-12 MED ORDER — ACETAMINOPHEN 10 MG/ML IV SOLN: 1000.0000 mg | Freq: Once | INTRAVENOUS | Status: DC | PRN

## 2020-12-12 MED ORDER — KETAMINE HCL 50 MG/5ML IJ SOSY
PREFILLED_SYRINGE | INTRAMUSCULAR | Status: AC
  Filled 2020-12-12: qty 5

## 2020-12-12 MED ORDER — EPHEDRINE 5 MG/ML INJ
INTRAVENOUS | Status: AC
  Filled 2020-12-12: qty 5

## 2020-12-12 MED ORDER — FENTANYL CITRATE (PF) 100 MCG/2ML IJ SOLN: 25.0000 ug | INTRAMUSCULAR | Status: DC | PRN

## 2020-12-12 SURGICAL SUPPLY — 37 items
BLADE MED AGGRESSIVE (BLADE) ×2 IMPLANT
BLADE SURG 15 STRL LF DISP TIS (BLADE) IMPLANT
BLADE SURG 15 STRL SS (BLADE) ×1
BNDG ELASTIC 4X5.8 VLCR STR LF (GAUZE/BANDAGES/DRESSINGS) ×2 IMPLANT
BNDG ESMARK 4X12 TAN STRL LF (GAUZE/BANDAGES/DRESSINGS) ×2 IMPLANT
BNDG GAUZE ELAST 4 BULKY (GAUZE/BANDAGES/DRESSINGS) ×2 IMPLANT
CNTNR SPEC 2.5X3XGRAD LEK (MISCELLANEOUS) ×1
CONT SPEC 4OZ STER OR WHT (MISCELLANEOUS) ×1
CONTAINER SPEC 2.5X3XGRAD LEK (MISCELLANEOUS) ×1 IMPLANT
ELECT REM PT RETURN 9FT ADLT (ELECTROSURGICAL) ×2
ELECTRODE REM PT RTRN 9FT ADLT (ELECTROSURGICAL) ×1 IMPLANT
GAUZE 4X4 16PLY ~~LOC~~+RFID DBL (SPONGE) ×2 IMPLANT
GAUZE SPONGE 4X4 12PLY STRL (GAUZE/BANDAGES/DRESSINGS) ×4 IMPLANT
GAUZE XEROFORM 1X8 LF (GAUZE/BANDAGES/DRESSINGS) ×2 IMPLANT
GLOVE SURG ENC MOIS LTX SZ7 (GLOVE) ×2 IMPLANT
GLOVE SURG UNDER LTX SZ7 (GLOVE) ×2 IMPLANT
GOWN STRL REUS W/ TWL LRG LVL3 (GOWN DISPOSABLE) ×2 IMPLANT
GOWN STRL REUS W/TWL LRG LVL3 (GOWN DISPOSABLE) ×2
KIT TURNOVER KIT A (KITS) ×2 IMPLANT
LABEL OR SOLS (LABEL) ×2 IMPLANT
MANIFOLD NEPTUNE II (INSTRUMENTS) ×2 IMPLANT
NDL FILTER BLUNT 18X1 1/2 (NEEDLE) ×1 IMPLANT
NDL HYPO 25X1 1.5 SAFETY (NEEDLE) ×2 IMPLANT
NEEDLE FILTER BLUNT 18X 1/2SAF (NEEDLE) ×1
NEEDLE FILTER BLUNT 18X1 1/2 (NEEDLE) ×1 IMPLANT
NEEDLE HYPO 25X1 1.5 SAFETY (NEEDLE) ×4 IMPLANT
NS IRRIG 500ML POUR BTL (IV SOLUTION) ×2 IMPLANT
PACK EXTREMITY ARMC (MISCELLANEOUS) ×2 IMPLANT
SOL PREP PVP 2OZ (MISCELLANEOUS) ×4
SOLUTION PREP PVP 2OZ (MISCELLANEOUS) ×1 IMPLANT
STOCKINETTE STRL 6IN 960660 (GAUZE/BANDAGES/DRESSINGS) ×2 IMPLANT
SUT ETHILON 3-0 FS-10 30 BLK (SUTURE) ×4
SUT VIC AB 3-0 SH 27 (SUTURE) ×1
SUT VIC AB 3-0 SH 27X BRD (SUTURE) ×1 IMPLANT
SUTURE EHLN 3-0 FS-10 30 BLK (SUTURE) ×2 IMPLANT
SYR 10ML LL (SYRINGE) ×2 IMPLANT
WATER STERILE IRR 500ML POUR (IV SOLUTION) ×2 IMPLANT

## 2020-12-12 NOTE — Anesthesia Preprocedure Evaluation (Addendum)
Anesthesia Evaluation  Patient identified by MRN, date of birth, ID band Patient awake    Reviewed: Allergy & Precautions, NPO status , Patient's Chart, lab work & pertinent test results  History of Anesthesia Complications Negative for: history of anesthetic complications  Airway Mallampati: III  TM Distance: >3 FB Neck ROM: Full    Dental no notable dental hx. (+) Teeth Intact   Pulmonary neg pulmonary ROS, neg sleep apnea, neg COPD, Patient abstained from smoking.Not current smoker,    Pulmonary exam normal breath sounds clear to auscultation       Cardiovascular Exercise Tolerance: Good METShypertension (kidney protection per patient), (-) CAD and (-) Past MI (-) dysrhythmias  Rhythm:Regular Rate:Normal - Systolic murmurs    Neuro/Psych Anxiety Depression  Neuromuscular disease (neuropathy) negative psych ROS   GI/Hepatic neg GERD  ,(+)     (-) substance abuse  ,   Endo/Other  diabetes, Type 2, Insulin DependentMorbid obesity  Renal/GU Renal disease (s/p nephrectomy, function recovered)     Musculoskeletal   Abdominal (+) + obese,   Peds  Hematology  (+) Blood dyscrasia, anemia ,   Anesthesia Other Findings   Osteomyelitis of fourth toe of right foot    Diabetic foot infection   Past Medical History: No date: Anemia     Comment:  iron transfusions x 2 No date: COVID-19 No date: DDD (degenerative disc disease), lumbar No date: Diabetes mellitus without complication (HCC)     Comment:  Type II 2016: DVT (deep venous thrombosis) (HCC)     Comment:  post back surgery - was on Eliquis 3 months No date: Kidney stone  Reproductive/Obstetrics                            Anesthesia Physical  Anesthesia Plan  ASA: 3  Anesthesia Plan: General   Post-op Pain Management:    Induction: Intravenous  PONV Risk Score and Plan: 3 and Ondansetron, Midazolam and Propofol  infusion  Airway Management Planned: Natural Airway and Nasal Cannula  Additional Equipment: None  Intra-op Plan:   Post-operative Plan:   Informed Consent: I have reviewed the patients History and Physical, chart, labs and discussed the procedure including the risks, benefits and alternatives for the proposed anesthesia with the patient or authorized representative who has indicated his/her understanding and acceptance.     Dental advisory given  Plan Discussed with: CRNA and Surgeon  Anesthesia Plan Comments:       Anesthesia Quick Evaluation

## 2020-12-12 NOTE — Plan of Care (Signed)

## 2020-12-12 NOTE — Op Note (Signed)
PODIATRY / FOOT AND ANKLE SURGERY OPERATIVE REPORT    SURGEON: Rosetta Posner, DPM  PRE-OPERATIVE DIAGNOSIS:  1.  Osteomyelitis right fourth ray 2.  Diabetes type 2 polyneuropathy 3.  PVD  POST-OPERATIVE DIAGNOSIS: Same  PROCEDURE(S): Right partial fourth ray amputation  HEMOSTASIS: Right ankle tourniquet  ANESTHESIA: general  ESTIMATED BLOOD LOSS: 30 cc  FINDING(S): 1.  Osteomyelitis to the right fourth toe and also appeared to have osteomyelitis of the fourth metatarsal head  PATHOLOGY/SPECIMEN(S): Pathology specimen right fourth toe and metatarsal head with proximal margin marked in purple ink.  Bone culture right fourth metatarsal  INDICATIONS:   Katelyn Barnes is a 40 y.o. female who presents with a nonhealing ulceration to the lateral aspect of the fourth toe at the PIPJ.  Patient was seen in office and was noted to have a wound that probed to bone x-ray imaging which did show osteomyelitis present to the fourth toe.  Also showed a large amount of vascular calcification.  Patient was subsequently admitted to the hospital for this issue due to worsening signs of infection and concern for arterial flow.  Patient had ABIs which showed for the most part biphasic flow but appeared to have monophasic DP signal.  Patient also had elevated pressures or elevated ABIs consistent with arterial calcinosis.  Vascular team was consulted.  MRI imaging showed osteomyelitis to the fourth toe and apparent inflammation to the fourth metatarsal head indicative of possible osteomyelitis to the fourth metatarsal head as well.  All treatment options were discussed with the patient both conservative and surgical attempts at correction clean potential risks and complications at this time patient is elected for surgical procedure consisting of right fourth ray amputation.  No guarantees given.  Consent obtained prior to procedure.  DESCRIPTION: After obtaining full informed written consent, the  patient was brought back to the operating room and placed supine upon the operating table.  The patient received IV antibiotics prior to induction.  After obtaining adequate anesthesia, a block was performed around the fourth ray with 20 cc of half percent Marcaine plain.  Ankle tourniquet was placed about the patient's right ankle.  The patient was prepped and draped in the standard fashion.  An incision was drawn out around the right fourth toe and a racquet type of incision with arms extending proximal dorsal and proximal plantar making a elliptical type of incision around the fourth digit.  The incision was made and deepened all the way down to the bone.  Appeared to have a fairly substantial amount of bleeding with the incision indicative of likely adequate arterial flow.  Patient did have a few arterials that appeared to be calcified including the dorsal medial branch of the digital artery.  All arterials were cauterized as necessary.  Once again a fair amount of bleeding was still occurring and oozing so at this time it was determined to use the tourniquet.  An Esmarch bandage was used to exsanguinate the right lower extremity pneumatic ankle tourniquet was inflated.  At this time an extensor tenotomy and capsulotomy was performed at the fourth metatarsal phalangeal joint followed by release the collateral and suspensory ligaments and plantar plate and connection to the flexor tendon.  The fourth toe was disarticulated and passed off in the operative site.  The bone appeared to be fractured at this level indicative of bone infection/osteomyelitis.  The fourth metatarsal head also appeared to be somewhat gray in nature and appeared to be fairly soft indicative of osteomyelitis so the  fourth metatarsal head resected with the sagittal bone saw with the appropriate beveling.  The remaining portion of the bone appeared to be hard and had no further evidence of osteomyelitis.  The plantar plate as well as flexor  and extensor tendons were resected as far proximally as possible.  The surgical site was flushed with copious amounts normal sterile saline.  The deep subcutaneous tissue was then reapproximated well coapted as well as deep structures with 3-0 Vicryl and the skin was then reapproximated well coapted with 3-0 nylon.  The pneumatic ankle tourniquet was deflated and a prompt operative response was noted all digits of the right foot.  Capillary fill time appeared to be intact to digital flaps.  Postoperative dressing was applied consisting of Xeroform followed by 4 x 4 gauze, Kerlix, Ace wrap.  Patient tolerated the procedure and anesthesia well was transferred to recovery in vital signs stable vascular status intact all toes of the right foot.  Patient will be discharged back to the inpatient room with the appropriate orders and instructions.  We will follow-up with patient tomorrow for dressing change and could potentially discharge with oral antibiotics after that time.  Discussed need postoperatively for close vascular follow-up as if she has problems healing the incision she may require a CT angiogram but appeared to have adequate blood flow for healing based on procedure today.  COMPLICATIONS: None  CONDITION: Good, stable  Rosetta Posner, DPM

## 2020-12-12 NOTE — H&P (Signed)
HISTORY AND PHYSICAL INTERVAL NOTE:  12/12/2020  3:45 PM  Katelyn Barnes  has presented today for surgery, with the diagnosis of Partial 4th toe osteomyelitis right.  The various methods of treatment have been discussed with the patient.  No guarantees were given.  After consideration of risks, benefits and other options for treatment, the patient has consented to surgery.  I have reviewed the patients' chart and labs.    PROCEDURE: RIGHT PARTIAL 4TH RAY AMPUTATION RIGHT BONE BIOPSY 4TH METATARSAL  A history and physical examination was performed in the hospital.  The patient was reexamined.  There have been no changes to this history and physical examination.  Rosetta Posner, DPM

## 2020-12-12 NOTE — Progress Notes (Signed)
PROGRESS NOTE    Katelyn Barnes  VQQ:[id number] DOB: 01-Jul-1980 DOA: 12/09/2020 PCP: Cathie Hoops, PA   Brief Narrative: Taken from H&P. Katelyn Barnes is a 40 y.o. female with medical history significant for Diabetes, hypertension, class III obesity with obesity hypoventilation syndrome who was sent in for admission by her podiatrist due to concerns for osteomyelitis of the fourth toe of the right foot.  Patient has completed 2 courses of antibiotics with Keflex and doxycycline but continues to have increasing pain now with redness and swelling.  X-ray concerning for osteomyelitis. Podiatry consulted vascular surgery for angiography, ABI results pending. She was started on Zosyn and vancomycin, antibiotic switched to cefepime and vancomycin. MRI ordered by podiatry shows osteomyelitis of proximal and middle phalanx of fourth toe with some associated neurogenic edema and mild intermetatarsal bursitis between the heads of first and second metatarsals.  There was also some subtle marrow edema in the proximal metaphases of the fifth metatarsal which can be nonspecific but probably incidental.  Patient is going for angiography due to abnormal ABI results with vascular surgery on Monday. Going for fourth toe amputation later today.  Subjective: Patient continued to have some pain in right foot.  Waiting for her procedure later today.  Husband and brother at bedside.  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  Patient will need lower extremity angiography before proceeding with amputation.  MRI with osteomyelitis, see full report.  ABI done-shows noncompressible arteries.  Going for  angiography and possible intervention with vascular surgery on Monday before proceeding with amputation.  Initially received Zosyn and vancomycin, Zosyn was later switched with cefepime. -Continue with cefepime and vancomycin for now.  Class III obesity. Estimated body mass index is 40.03 kg/m as calculated from the following:   Height as of this encounter: 5\' 6"  (1.676 m).   Weight as of this encounter: 112.5 kg.   Type 2 diabetes mellitus. -Continue with SSI  Essential hypertension.  Blood pressure within goal. -Continue with home dose of losartan  Objective: Vitals:   12/12/20 0515 12/12/20 0725 12/12/20 1104 12/12/20 1521  BP: (!) 107/59 124/69 115/67 134/80  Pulse: 75 74 77   Resp: 20 20 (!) 22 20  Temp: 98 F (36.7 C) 99 F (37.2 C) 98.5 F (36.9 C) 98.7 F (37.1 C)  TempSrc:  Oral  Oral  SpO2: 99% 100% 97% 100%  Weight:      Height:        Intake/Output Summary (Last 24 hours) at 12/12/2020 1536 Last data filed at 12/12/2020 0900 Gross per 24 hour  Intake 240 ml  Output --  Net 240 ml    Filed Weights   12/09/20 1826  Weight: 112.5 kg    Examination:  General.  Well-developed lady, in no acute distress. Pulmonary.  Lungs clear bilaterally, normal respiratory effort. CV.  Regular rate and rhythm, no JVD, rub or murmur. Abdomen.  Soft, nontender, nondistended, BS positive. CNS.  Alert and oriented x3.  No focal neurologic deficit. Extremities.  Right foot with Ace wrap. Psychiatry.  Judgment and insight appears normal.   DVT prophylaxis: Lovenox Code Status: Full Family Communication: Discussed with husband and brother at bedside. Disposition  Plan:  Status is: Inpatient  Remains inpatient appropriate because: Severity of illness   Level of care: Med-Surg  All the records are reviewed and case discussed with Care Management/Social Worker. Management plans discussed with the patient, nursing and they are in agreement.  Consultants:   Podiatry Vascular surgery  Procedures:  Antimicrobials:  Cefepime Vancomycin  Data Reviewed: I have personally reviewed following labs and imaging studies  CBC: Recent Labs  Lab 12/09/20 1828  WBC 7.9  HGB 9.7*  HCT 31.3*  MCV 92.1  PLT [id number]    Basic Metabolic Panel: Recent Labs  Lab 12/09/20 1828 12/11/20 0336  NA 137  --   K 3.9  --   CL 112*  --   CO2 19*  --   GLUCOSE 94  --   BUN 36*  --   CREATININE 1.08* 1.04*  CALCIUM 9.0  --     GFR: Estimated Creatinine Clearance: 90.6 mL/min (A) (by C-G formula based on SCr of 1.04 mg/dL (H)). Liver Function Tests: No results for input(s): AST, ALT, ALKPHOS, BILITOT, PROT, ALBUMIN in the last 168 hours. No results for input(s): LIPASE, AMYLASE in the last 168 hours. No results for input(s): AMMONIA in the last 168 hours. Coagulation Profile: No results for input(s): INR, PROTIME in the last 168 hours. Cardiac Enzymes: No results for input(s): CKTOTAL, CKMB, CKMBINDEX, TROPONINI in the last 168 hours. BNP (last 3 results) No results for input(s): PROBNP in the last 8760 hours. HbA1C: Recent Labs    12/09/20 1828  HGBA1C 7.5*    CBG: Recent Labs  Lab 12/11/20 1229 12/11/20 1619 12/11/20 2234 12/12/20 0729 12/12/20 1104  GLUCAP 130* 154* 142* 155* 122*    Lipid Profile: No results for input(s): CHOL, HDL, LDLCALC, TRIG, CHOLHDL, LDLDIRECT in the last 72 hours. Thyroid Function Tests: No results for input(s): TSH, T4TOTAL, FREET4, T3FREE, THYROIDAB in the last 72 hours. Anemia Panel: No results for input(s): VITAMINB12, FOLATE, FERRITIN, TIBC, IRON, RETICCTPCT in the last 72 hours. Sepsis Labs: No results for input(s): PROCALCITON, LATICACIDVEN in the last 168 hours.  Recent Results (from the past 240 hour(s))  Resp Panel by RT-PCR (Flu A&B, Covid) Nasopharyngeal Swab     Status: None   Collection Time: 12/10/20 12:12 AM   Specimen: Nasopharyngeal Swab; Nasopharyngeal(NP) swabs in vial transport  medium  Result Value Ref Range Status   SARS Coronavirus 2 by RT PCR NEGATIVE NEGATIVE Final    Comment: (NOTE) SARS-CoV-2 target nucleic acids are NOT DETECTED.  The SARS-CoV-2 RNA is generally detectable in upper respiratory specimens during the acute phase of infection. The lowest concentration of SARS-CoV-2 viral copies this assay can detect is 138 copies/mL. A negative result does not preclude SARS-Cov-2 infection and should not be used as the sole basis for treatment or other patient management decisions. A negative result may occur with  improper specimen collection/handling, submission of specimen other than nasopharyngeal swab, presence of viral mutation(s) within the areas targeted by this assay, and inadequate number of viral copies(<138 copies/mL). A negative result must be combined with clinical observations, patient history, and epidemiological information. The expected result is Negative.  Fact Sheet for Patients:  EntrepreneurPulse.com.au  Fact Sheet for Healthcare Providers:  IncredibleEmployment.be  This test is no t yet approved or cleared by the [place] FDA and  has been authorized for detection and/or diagnosis of SARS-CoV-2 by FDA under an Emergency Use Authorization (EUA). This EUA will remain  in effect (meaning this test can be used)  for the duration of the COVID-19 declaration under Section 564(b)(1) of the Act, 21 U.S.C.section 360bbb-3(b)(1), unless the authorization is terminated  or revoked sooner.       Influenza A by PCR NEGATIVE NEGATIVE Final   Influenza B by PCR NEGATIVE NEGATIVE Final    Comment: (NOTE) The Xpert Xpress SARS-CoV-2/FLU/RSV plus assay is intended as an aid in the diagnosis of influenza from Nasopharyngeal swab specimens and should not be used as a sole basis for treatment. Nasal washings and aspirates are unacceptable for Xpert Xpress SARS-CoV-2/FLU/RSV testing.  Fact Sheet for  Patients: EntrepreneurPulse.com.au  Fact Sheet for Healthcare Providers: IncredibleEmployment.be  This test is not yet approved or cleared by the [place] FDA and has been authorized for detection and/or diagnosis of SARS-CoV-2 by FDA under an Emergency Use Authorization (EUA). This EUA will remain in effect (meaning this test can be used) for the duration of the COVID-19 declaration under Section 564(b)(1) of the Act, 21 U.S.C. section 360bbb-3(b)(1), unless the authorization is terminated or revoked.  Performed at [facility], [address] [place], [place] [address]   MRSA Next Gen by PCR, Nasal     Status: None   Collection Time: 12/10/20 12:24 PM   Specimen: Nasal Mucosa; Nasal Swab  Result Value Ref Range Status   MRSA by PCR Next Gen NOT DETECTED NOT DETECTED Final    Comment: (NOTE) The GeneXpert MRSA Assay (FDA approved for NASAL specimens only), is one component of a comprehensive MRSA colonization surveillance program. It is not intended to diagnose MRSA infection nor to guide or monitor treatment for MRSA infections. Test performance is not FDA approved in patients less than 50 years old. Performed at [facility], [address], [place], [place] [address]   Aerobic/Anaerobic Culture w Gram Stain (surgical/deep wound)     Status: None (Preliminary result)   Collection Time: 12/10/20  1:02 PM   Specimen: Toe; Wound  Result Value Ref Range Status   Specimen Description   Final    TOE Performed at [facility], [address], [place], [place] [address]    Special Requests   Final    NONE Performed at [facility], [place], [place] [address]    Gram Stain   Final    NO SQUAMOUS EPITHELIAL CELLS SEEN FEW WBC SEEN FEW GRAM NEGATIVE RODS FEW GRAM POSITIVE COCCI    Culture   Final    CULTURE REINCUBATED FOR BETTER GROWTH HOLDING FOR POSSIBLE ANAEROBE Performed  at [facility], [place] [address] [place], [place] [address]    Report Status PENDING  Incomplete      Radiology Studies: MR FOOT RIGHT WO CONTRAST  Result Date: 12/11/2020 CLINICAL DATA:  Wound infection with swelling in the fourth toe including lateral ulceration. EXAM: MRI OF THE RIGHT FOREFOOT WITHOUT CONTRAST TECHNIQUE: Multiplanar, multisequence MR imaging of the right forefoot was performed. No intravenous contrast was administered. COMPARISON:  Radiographs 12/10/2020 FINDINGS: Bones/Joint/Cartilage There is active osteomyelitis of the proximal and middle phalanges of the fourth toe. Lesser edema involves the distal phalanx and the head and proximal shaft of the fourth metatarsal which could also be from early osteomyelitis. Tapered appearance of the proximal phalanx distally along with cortical demineralization suggesting bony destructive findings corroborating the appearance on CT. On STIR image 3 of series 10 there is questionable edema signal in the distal phalanx of the small toe but this is not corroborated on other imaging planes are  sequences and is probably due to volume averaging rather than actual marrow edema. There is some faint marrow edema in the proximal metaphysis of the fifth metatarsal on image 17 series 9, nonspecific but unlikely to be infectious, possibly low-grade stress reaction or from disuse. Ligaments The Lisfranc ligament appears intact. Muscles and Tendons The diffuse nature of the regional muscular edema favors neurogenic edema over myositis although the latter is not completely excluded. Soft tissues Mild intermetatarsal bursitis between the heads of the first and second metatarsals on image twenty-two series 6. Dorsal subcutaneous edema in the foot is nonspecific but may reflect cellulitis. Substantial subcutaneous edema in the fourth toe. IMPRESSION: 1. Active osteomyelitis of the proximal and middle phalanges of the fourth toe. Lesser edema in the distal phalanx  and the head of the fourth metatarsal and could be reactive or from early osteomyelitis as well. Substantial subcutaneous edema favoring cellulitis in the fourth toe. 2. Diffuse regional muscular edema favoring neurogenic edema. 3. Mild intermetatarsal bursitis between the heads of the first and second metatarsals. 4. Dorsal subcutaneous edema in the foot is nonspecific but may reflect cellulitis. 5. Subtle marrow edema in the proximal metaphysis of the fifth metatarsal, nonspecific but probably incidental. Electronically Signed   By: Van Clines M.D.   On: 12/11/2020 08:37   US ARTERIAL ABI (SCREENING LOWER EXTREMITY)  Result Date: 12/11/2020 CLINICAL DATA:  Right fourth digit wound EXAM: NONINVASIVE PHYSIOLOGIC VASCULAR STUDY OF BILATERAL LOWER EXTREMITIES TECHNIQUE: Evaluation of both lower extremities were performed at rest, including calculation of ankle-brachial indices with single level Doppler, pressure and pulse volume recording. COMPARISON:  None. FINDINGS: Right ABI:  1.12 Left ABI:  Unable to calculate Right Lower Extremity: Minimally abnormal triphasic arterial waveforms in the posterior tibial artery. However, the dorsalis pedis waveform is abnormal and monophasic suggesting proximal occlusive disease. Left Lower Extremity:  Relatively normal arterial waveforms. 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 The left ankle-brachial index cannot be calculated due to noncompressibility of the arteries. 2. On the right, the ankle-brachial index is calculated at 1.1, however this is likely falsely elevated due to the underlying difficulty with compression. 3. 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 Signed, Criselda Peaches, MD, [facility] Electronically Signed   By: Jacqulynn Cadet M.D.   On: 12/11/2020 07:49    Scheduled Meds:  [MAR Hold] atorvastatin  40 mg Oral Daily   chlorhexidine  60 mL Topical Once   [MAR Hold] enoxaparin (LOVENOX) injection  0.5 mg/kg Subcutaneous Q24H   [MAR Hold] fluconazole  100 mg Oral Daily   [MAR Hold] insulin aspart  0-20 Units Subcutaneous TID WC   [MAR Hold] insulin aspart  0-5 Units Subcutaneous QHS   [MAR Hold] losartan  50 mg Oral Daily   [MAR Hold] nystatin cream   Topical BID   povidone-iodine  2 application Topical Once   [MAR Hold] topiramate  150 mg Oral BID   Continuous Infusions:  [MAR Hold] ceFEPime (MAXIPIME) IV 2 g (12/12/20 1110)   [MAR Hold] vancomycin 1,500 mg (12/12/20 0658)     LOS: 2 days   Time spent: 38 minutes. More than 50% of the time was spent in counseling/coordination of care  Lorella Nimrod, MD [facility]  If 7PM-7AM, please contact night-coverage [email or web address]  12/12/2020, 3:36 PM   This record has been created using Dragon voice  recognition software. Errors have been sought and corrected,but may not always be located. Such creation errors do not reflect on the standard of care.

## 2020-12-12 NOTE — Transfer of Care (Signed)
Immediate Anesthesia Transfer of Care Note  Patient: Keshara Kiger  Procedure(s) Performed: AMPUTATION RAY-4th Ray Partial (Right: Fourth Toe) BONE BIOPSY (Right: Fourth Toe)  Patient Location: PACU  Anesthesia Type:General  Level of Consciousness: awake and drowsy  Airway & Oxygen Therapy: Patient Spontanous Breathing and Patient connected to face mask oxygen  Post-op Assessment: Report given to RN and Post -op Vital signs reviewed and stable  Post vital signs: Reviewed and stable  Last Vitals:  Vitals Value Taken Time  BP 103/66 12/12/20 1700  Temp    Pulse 87 12/12/20 1704  Resp 20 12/12/20 1704  SpO2 100 % 12/12/20 1704  Vitals shown include unvalidated device data.  Last Pain:  Vitals:   12/12/20 1521  TempSrc: Oral  PainSc:       Patients Stated Pain Goal: 3 (12/10/20 1930)  Complications: No notable events documented.

## 2020-12-13 ENCOUNTER — Encounter: Payer: Self-pay | Admitting: Podiatry

## 2020-12-13 LAB — GLUCOSE, CAPILLARY
Glucose-Capillary: 136 mg/dL — ABNORMAL HIGH (ref 70–99)
Glucose-Capillary: 181 mg/dL — ABNORMAL HIGH (ref 70–99)

## 2020-12-13 MED ORDER — FLUCONAZOLE 100 MG PO TABS
100.0000 mg | ORAL_TABLET | Freq: Every day | ORAL | 0 refills | Status: AC
Start: 2020-12-14 — End: 2020-12-17

## 2020-12-13 MED ORDER — SULFAMETHOXAZOLE-TRIMETHOPRIM 800-160 MG PO TABS: 1.0000 | ORAL_TABLET | Freq: Two times a day (BID) | ORAL | 0 refills | Status: AC

## 2020-12-13 MED ORDER — NYSTATIN 100000 UNIT/GM EX CREA: TOPICAL_CREAM | Freq: Two times a day (BID) | CUTANEOUS | 0 refills | Status: DC

## 2020-12-13 MED ORDER — HYDROCODONE-ACETAMINOPHEN 5-325 MG PO TABS: 1.0000 | ORAL_TABLET | ORAL | 0 refills | Status: DC | PRN

## 2020-12-13 NOTE — Discharge Instructions (Signed)
Podiatry discharge instructions: 1.  Keep surgical dressings clean, dry, and intact until first postoperative visit.  If for whatever reason the dressings become saturated or loosens, okay to change dressing consisting of nonadherent gauze to the incision site followed by 4 x 4 gauze, roll gauze, and Ace wrap.  Keep area of surgical site clean and intact otherwise could have worsening infection or wound healing issues. 2.  Remain partial weightbearing to the surgical foot with heel contact only in wedge type heel shoe.  Use walker aid for support if needed.  Try to stay off of the surgical foot as much as possible as this will decrease chances for wound healing issue. 3.  Elevate surgical extremity when at rest to decrease swelling and pain. 4.  Take antibiotics and pain medication as prescribed if prescribed. 5.  If any other questions or concerns call clinic for instruction.  Follow-up within 1 week of your surgical date for dressing change and assessment of incision site. 6.  Follow-up with vascular surgery within 1 month of discharge date for further assessment of blood flow.

## 2020-12-13 NOTE — TOC Initial Note (Signed)
Transition of Care [facility] - Initial/Assessment Note    Patient Details  Name: Katelyn Barnes MRN: [id number] Date of Birth: 1980/03/23  Transition of [facility]) CM/SW Contact:    Liliana Cline, LCSW Phone Number: 12/13/2020, 12:10 PM  Clinical Narrative:        Spoke to patient re DC planning. PT recommends RW.  Patient lives with her children and drives herself to appointments. PCP is Fabienne Bruns. Patient is agreeable to RW being ordered, referral made to [employer] Rep Jasmine. Asked MD for orders.           Expected Discharge Plan: Home/Self Care Barriers to Discharge: Continued Medical Work up   Patient Goals and CMS Choice Patient states their goals for this hospitalization and ongoing recovery are:: to return home CMS Medicare.gov Compare Post Acute Care list provided to:: Patient Choice offered to / list presented to : Patient  Expected Discharge Plan and Services Expected Discharge Plan: Home/Self Care       Living arrangements for the past 2 months: Single Family Home                 DME Arranged: Walker rolling DME Agency: [employer] Date DME Agency Contacted: 12/13/20   Representative spoke with at DME Agency: Leavy Cella            Prior Living Arrangements/Services Living arrangements for the past 2 months: Single Family Home Lives with:: Minor Children Patient language and need for interpreter reviewed:: Yes Do you feel safe going back to the place where you live?: Yes      Need for Family Participation in Patient Care: Yes (Comment) Care giver support system in place?: Yes (comment)   Criminal Activity/Legal Involvement Pertinent to Current Situation/Hospitalization: No - Comment as needed  Activities of Daily Living Home Assistive Devices/Equipment: None ADL Screening (condition at time of admission) Patient's cognitive ability adequate to safely complete daily activities?: Yes Is the patient deaf or have difficulty hearing?: No Does the  patient have difficulty seeing, even when wearing glasses/contacts?: No Does the patient have difficulty concentrating, remembering, or making decisions?: No Patient able to express need for assistance with ADLs?: Yes Does the patient have difficulty dressing or bathing?: No Independently performs ADLs?: Yes (appropriate for developmental age) Does the patient have difficulty walking or climbing stairs?: No Weakness of Legs: Right Weakness of Arms/Hands: None  Permission Sought/Granted Permission sought to share information with : Facility Industrial/product designer granted to share information with : Yes, Verbal Permission Granted     Permission granted to share info w AGENCY: DME agencies        Emotional Assessment       Orientation: : Oriented to Self, Oriented to Place, Oriented to  Time, Oriented to Situation Alcohol / Substance Use: Not Applicable Psych Involvement: No (comment)  Admission diagnosis:  Diabetic foot infection (HCC) [W09.811, L08.9] Patient Active Problem List   Diagnosis Date Noted   Osteomyelitis of fourth toe of right foot (HCC) 12/10/2020   Diabetic foot infection (HCC) 12/10/2020   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   Renal abscess 09/05/2019   Left flank pain    Diabetes mellitus (HCC)    Essential hypertension    Iron deficiency 08/09/2019   Hyperglycemia    Acute respiratory failure (HCC)    Sepsis (HCC) 06/11/2019   Acute renal failure (ARF) (HCC) 06/11/2019   Pyelonephritis 06/11/2019   DKA (diabetic ketoacidoses) 06/11/2019   Obesity hypoventilation syndrome (HCC) 06/11/2019   Severe sepsis with  acute organ dysfunction (HCC) 06/11/2019   Radiculopathy 05/05/2016   Obesity, Class III, BMI 40-49.9 (morbid obesity) (HCC) 06/27/2014   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 [place] [address] Phone: [phone] Fax: [phone]  [facility] [place], [place] - [address] [place] [address] Phone: [phone] Fax: [phone]     Social Determinants of Health (SDOH) Interventions    Readmission Risk Interventions No flowsheet data found.

## 2020-12-13 NOTE — Discharge Summary (Signed)
PODIATRY / FOOT AND ANKLE SURGERY PROGRESS NOTE  Reason for consult: R 4th toe infection  Chief Complaint: R 4th toe infection, wound   HPI: Katelyn Barnes is a 40 y.o. female who presents status post 1 day right partial fourth ray amputation due to infection present.  Patient notes minimal to no pain at this time.  Patient has kept dressings clean, dry, and intact.  Patient has worked with physical therapy as well and is wearing a surgical shoe with heel platform.  Patient denies nausea, vomiting, fever, chills.  PMHx:  Past Medical History:  Diagnosis Date   Anemia    iron transfusions x 2   COVID-19    DDD (degenerative disc disease), lumbar    Diabetes mellitus without complication (HCC)    Type II   DVT (deep venous thrombosis) (HCC) 2016   post back surgery - was on Eliquis 3 months   Kidney stone     Surgical Hx:  Past Surgical History:  Procedure Laterality Date   AMPUTATION Right 12/12/2020   Procedure: AMPUTATION RAY-4th Ray Partial;  Surgeon: Rosetta Posner, DPM;  Location: [facility];  Service: Podiatry;  Laterality: Right;   ANTERIOR CERVICAL DECOMP/DISCECTOMY FUSION Right 05/05/2016   Procedure: ANTERIOR CERVICAL DECOMPRESSION FUSION, CERVICAL 7- THORACIC 1 WITH INSTRUMENTATION AND ALLOGRAFT;  Surgeon: Estill Bamberg, MD;  Location: [facility];  Service: Orthopedics;  Laterality: Right;  ANTERIOR CERVICAL DECOMPRESSION FUSION, CERVICAL 7- THORACIC 1 WITH INSTRUMENTATION AND ALLOGRAFT   BACK SURGERY  2016   discectomy    BONE BIOPSY Right 12/12/2020   Procedure: BONE BIOPSY;  Surgeon: Rosetta Posner, DPM;  Location: [facility];  Service: Podiatry;  Laterality: Right;   CARPAL TUNNEL RELEASE Bilateral    CYSTOSCOPY W/ RETROGRADES Left 07/06/2019   Procedure: CYSTOSCOPY WITH RETROGRADE PYELOGRAM;  Surgeon: [name], MD;  Location: [facility];  Service: Urology;  Laterality: Left;   CYSTOSCOPY WITH URETEROSCOPY AND STENT PLACEMENT Left 06/11/2019   Procedure: CYSTOSCOPY WITH  URETEROSCOPY AND STENT PLACEMENT;  Surgeon: Jerilee Field, MD;  Location: [facility];  Service: Urology;  Laterality: Left;   CYSTOSCOPY WITH URETEROSCOPY AND STENT PLACEMENT Left 07/06/2019   Procedure: CYSTOSCOPY WITH URETEROSCOPY AND STENT exchange;  Surgeon: [name], MD;  Location: [facility];  Service: Urology;  Laterality: Left;   IR NEPHROSTOMY EXCHANGE LEFT  10/19/2019   LAPAROSCOPIC NEPHRECTOMY, HAND ASSISTED Left 04/04/2020   Procedure: HAND ASSISTED LAPAROSCOPIC NEPHRECTOMY;  Surgeon: [name], MD;  Location: [facility];  Service: Urology;  Laterality: Left;   SHOULDER ARTHROSCOPY     TUBAL LIGATION  08/2007    FHx: History reviewed. No pertinent family history.  Social History:  reports that she has never smoked. She has never used smokeless tobacco. She reports that she does not drink alcohol and does not use drugs.  Allergies:  Allergies  Allergen Reactions   Macrobid [Nitrofurantoin] Nausea And Vomiting   Penicillins Rash    14 years ago     Medications Prior to Admission  Medication Sig Dispense Refill   atorvastatin (LIPITOR) 40 MG tablet Take 40 mg by mouth daily.     docusate sodium (COLACE) 100 MG capsule Take 1 capsule (100 mg total) by mouth 2 (two) times daily. 10 capsule 0   doxycycline (VIBRAMYCIN) 100 MG capsule Take 100 mg by mouth 2 (two) times daily.     insulin isophane & regular human (HUMULIN 70/30 KWIKPEN) (70-30) 100 UNIT/ML KwikPen Inject 40 Units into the skin 2 (two)  times daily with a meal.      losartan (COZAAR) 50 MG tablet Take 1 tablet (50 mg total) by mouth daily. 30 tablet 0   metFORMIN (GLUCOPHAGE) 500 MG tablet Take 500 mg by mouth 2 (two) times daily.     Multiple Vitamins-Minerals (MULTIVITAMIN WITH MINERALS) tablet Take 1 tablet by mouth daily.     mupirocin ointment (BACTROBAN) 2 % Apply 1 application topically 3 (three) times daily.     terbinafine (LAMISIL) 250 MG tablet Take 250 mg by mouth daily.     topiramate  (TOPAMAX) 50 MG tablet Take 150 mg by mouth 2 (two) times daily.     HYDROcodone-acetaminophen (NORCO/VICODIN) 5-325 MG tablet Take 1-2 tablets by mouth every 6 (six) hours as needed for moderate pain or severe pain. (Patient not taking: Reported on 12/10/2020) 12 tablet 0   meclizine (ANTIVERT) 25 MG tablet Take 1 tablet (25 mg total) by mouth 3 (three) times daily as needed for dizziness. 15 tablet 0   methylPREDNISolone (MEDROL DOSEPAK) 4 MG TBPK tablet Take as directed (Patient not taking: Reported on 12/10/2020) 32 tablet 0    Physical Exam: General: Alert and oriented.  No apparent distress.  Vascular: PT/DP pulses +1 bilateral.  Mild hair growth noted to digits both feet.  Mild edema present to the right forefoot, decreased erythema.  Neuro: Light touch sensation reduced to digits bilaterally.  Derm: Incision site to the right fourth ray partial amputation appears to be well coapted with sutures intact, decreased erythema and edema noted since procedure, no drainage, no odor.     MSK: Right fourth ray partial amputation  Results for orders placed or performed during the hospital encounter of 12/09/20 (from the past 48 hour(s))  Glucose, capillary     Status: Abnormal   Collection Time: 12/11/20  4:19 PM  Result Value Ref Range   Glucose-Capillary 154 (H) 70 - 99 mg/dL    Comment: Glucose reference range applies only to samples taken after fasting for at least 8 hours.  Glucose, capillary     Status: Abnormal   Collection Time: 12/11/20 10:34 PM  Result Value Ref Range   Glucose-Capillary 142 (H) 70 - 99 mg/dL    Comment: Glucose reference range applies only to samples taken after fasting for at least 8 hours.  Glucose, capillary     Status: Abnormal   Collection Time: 12/12/20  7:29 AM  Result Value Ref Range   Glucose-Capillary 155 (H) 70 - 99 mg/dL    Comment: Glucose reference range applies only to samples taken after fasting for at least 8 hours.  Glucose, capillary      Status: Abnormal   Collection Time: 12/12/20 11:04 AM  Result Value Ref Range   Glucose-Capillary 122 (H) 70 - 99 mg/dL    Comment: Glucose reference range applies only to samples taken after fasting for at least 8 hours.  Aerobic/Anaerobic Culture w Gram Stain (surgical/deep wound)     Status: None (Preliminary result)   Collection Time: 12/12/20  4:37 PM   Specimen: PATH Other; Tissue  Result Value Ref Range   Specimen Description      WOUND Performed at [facility], [address], [place], [place] [address]    Special Requests      NONE Performed at [facility], [address] [place], [place] [address]    Gram Stain      NO SQUAMOUS EPITHELIAL CELLS SEEN NO WBC SEEN NO ORGANISMS SEEN  Culture      NO GROWTH < 12 HOURS Performed at [facility], [address] [place], [place] [address]    Report Status PENDING   Glucose, capillary     Status: Abnormal   Collection Time: 12/12/20  5:04 PM  Result Value Ref Range   Glucose-Capillary 138 (H) 70 - 99 mg/dL    Comment: Glucose reference range applies only to samples taken after fasting for at least 8 hours.  Glucose, capillary     Status: Abnormal   Collection Time: 12/12/20 10:39 PM  Result Value Ref Range   Glucose-Capillary 143 (H) 70 - 99 mg/dL    Comment: Glucose reference range applies only to samples taken after fasting for at least 8 hours.   Comment 1 Notify RN   Glucose, capillary     Status: Abnormal   Collection Time: 12/13/20  8:40 AM  Result Value Ref Range   Glucose-Capillary 136 (H) 70 - 99 mg/dL    Comment: Glucose reference range applies only to samples taken after fasting for at least 8 hours.  Glucose, capillary     Status: Abnormal   Collection Time: 12/13/20 12:18 PM  Result Value Ref Range   Glucose-Capillary 181 (H) 70 - 99 mg/dL    Comment: Glucose reference range applies only to samples taken after fasting for at least 8 hours.   DG Foot Complete  Right  Result Date: 12/12/2020 CLINICAL DATA:  Status post amputation of the fourth digit, initial encounter EXAM: RIGHT FOOT COMPLETE - 3+ VIEW COMPARISON:  12/10/2020 FINDINGS: There is been interval amputation of the fourth toe as well as the head of the fourth metatarsal. Mild air in the soft tissues is noted related to the recent surgery. Calcaneal spurring is noted. No new areas of bony erosive change are seen. IMPRESSION: Status post fourth toe amputation. Electronically Signed   By: Alcide Clever M.D.   On: 12/12/2020 19:36    Blood pressure 104/70, pulse 81, temperature 98.5 F (36.9 C), resp. rate 17, height 5\' 6"  (1.676 m), weight 112.5 kg, SpO2 100 %.   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 -Postoperative x-ray imaging reviewed and discussed with patient detail. -Incision site appears to be well coapted with sutures intact to the right partial fourth ray amputation site. -Reapplied Xeroform to the incision line followed by 4 x 4 gauze, Kerlix, Ace wrap.  Patient instructed to keep dressings clean, dry, and intact until visit in clinic. -Patient may be partial weightbearing with heel contact and surgical shoe. -Pathology pending.  Wound culture pending-gram-negative rods gram-positive cocci.  Recommend going home on broad-spectrum antibiotics for the next 7 days consisting of Bactrim and ciprofloxacin.  Podiatry team to sign off at this time.  Patient may be discharged from podiatry perspective.  Follow-up instructions placed in chart.  , DPM 12/13/2020, 2:54 PM

## 2020-12-13 NOTE — Discharge Summary (Signed)
Physician Discharge Summary  Jermika Olden FIE:[id number] DOB: 11/01/1980 DOA: 12/09/2020  PCP: [name], PA  Admit date: 12/09/2020 Discharge date: 12/13/2020  Admitted From: Home Disposition: Home  Recommendations for Outpatient Follow-up:  Follow up with PCP in 1-2 weeks Follow-up with podiatry in 1 week Follow-up with vascular surgery Please obtain BMP/CBC in one week Please follow up on the following pending results: Bone biopsy  Home Health: No Equipment/Devices: Rolling walker Discharge Condition: Stable CODE STATUS: Full Diet recommendation: Heart Healthy / Carb Modified   Brief/Interim Summary: Katelyn Barnes is a 40 y.o. female with medical history significant for Diabetes, hypertension, class III obesity with obesity hypoventilation syndrome who was sent in for admission by her podiatrist due to concerns for osteomyelitis of the fourth toe of the right foot.  Patient has completed 2 courses of antibiotics with Keflex and doxycycline but continues to have increasing pain now with redness and swelling.  X-ray concerning for osteomyelitis. Podiatry consulted vascular surgery for angiography, ABI results pending. She was started on Zosyn and vancomycin, antibiotic switched to cefepime and vancomycin. MRI ordered by podiatry shows osteomyelitis of proximal and middle phalanx of fourth toe with some associated neurogenic edema and mild intermetatarsal bursitis between the heads of first and second metatarsals.  There was also some subtle marrow edema in the proximal metaphases of the fifth metatarsal which can be nonspecific but probably incidental.  Patient underwent right fourth ray amputation with podiatry on 12/12/2020.  Tolerated the procedure well.  There was some concern of noncompressible arteries on ABI but there was a good blood flow during procedure.  Initial plan was to do angiography followed by possible intervention on Monday which was thought to  be deferred for outpatient at this time. Preliminary intraoperative cultures with few gram-positive cocci and gram-negative rods.  Patient was discharged on Bactrim as advised by podiatry and they will follow-up for final culture results and adjust antibiotics if needed.  Patient also met class III obesity criteria with BMI of 40.03.  She was counseled.  Patient will continue with rest of her home medications and follow-up with her providers.  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 You are being given Bactrim as antibiotics for 7 days as advised by your podiatrist. Please follow-up closely with your podiatrist for further recommendations and your culture results.   Increase activity slowly   Complete by: As directed    Leave dressing on - Keep it clean, dry, and intact until clinic visit   Complete by: As directed       Allergies as of 12/13/2020       Reactions   Macrobid [nitrofurantoin] Nausea And Vomiting   Penicillins Rash   14 years ago        Medication List     STOP taking these medications    doxycycline 100 MG capsule Commonly known as: VIBRAMYCIN   methylPREDNISolone 4 MG Tbpk tablet Commonly known as: [place]  these medications    atorvastatin 40 MG tablet Commonly known as: LIPITOR Take 40 mg by mouth daily.   docusate sodium 100 MG capsule Commonly known as: COLACE Take 1 capsule (100 mg total) by mouth 2 (two) times daily.   fluconazole 100 MG tablet Commonly known as:  DIFLUCAN Take 1 tablet (100 mg total) by mouth daily for 3 days. Start taking on: December 14, 2020   HumuLIN 70/30 KwikPen (70-30) 100 UNIT/ML KwikPen Generic drug: insulin isophane & regular human KwikPen Inject 40 Units into the skin 2 (two) times daily with a meal.   HYDROcodone-acetaminophen 5-325 MG tablet Commonly known as: NORCO/VICODIN Take 1-2 tablets by mouth every 4 (four) hours as needed for moderate pain. What changed:  when to take this reasons to take this   losartan 50 MG tablet Commonly known as: COZAAR Take 1 tablet (50 mg total) by mouth daily.   meclizine 25 MG tablet Commonly known as: ANTIVERT Take 1 tablet (25 mg total) by mouth 3 (three) times daily as needed for dizziness.   metFORMIN 500 MG tablet Commonly known as: GLUCOPHAGE Take 500 mg by mouth 2 (two) times daily.   multivitamin with minerals tablet Take 1 tablet by mouth daily.   mupirocin ointment 2 % Commonly known as: BACTROBAN Apply 1 application topically 3 (three) times daily.   nystatin cream Commonly known as: MYCOSTATIN Apply topically 2 (two) times daily.   sulfamethoxazole-trimethoprim 800-160 MG tablet Commonly known as: BACTRIM DS Take 1 tablet by mouth 2 (two) times daily for 7 days.   terbinafine 250 MG tablet Commonly known as: LAMISIL Take 250 mg by mouth daily.   topiramate 50 MG tablet Commonly known as: TOPAMAX Take 150 mg by mouth 2 (two) times daily.               Durable Medical Equipment  (From admission, onward)           Start     Ordered   12/13/20 1213  For home use only DME Walker rolling  Once       Question Answer Comment  Walker: With 5 Inch Wheels   Patient needs a walker to treat with the following condition Amputation of right great toe [facility])      12/13/20 1212              Discharge Care Instructions  (From admission, onward)           Start     Ordered   12/13/20 0000  Leave dressing on - Keep it clean, dry, and  intact until clinic visit        12/13/20 1516            Follow-up Information     Dew, Marlow Baars, MD Follow up in 1 month(s).   Specialties: Vascular Surgery, Radiology, Interventional Cardiology Why: Seen as consult. PAD. Will need ABI with visit. Contact information: [address] [place] [address] [phone]         Rosetta Posner, DPM. Schedule an appointment as soon as possible for a visit in 1 week(s).   Specialty: Podiatry Contact information: [address] [place] [address] [phone]                Allergies  Allergen Reactions   Macrobid [Nitrofurantoin] Nausea And Vomiting   Penicillins Rash    14 years ago     Consultations: Vascular surgery Podiatry  Procedures/Studies: MR FOOT RIGHT WO CONTRAST  Result Date: 12/11/2020 CLINICAL DATA:  Wound infection with swelling in the fourth toe including lateral ulceration. EXAM: MRI OF THE RIGHT FOREFOOT WITHOUT CONTRAST TECHNIQUE: Multiplanar, multisequence MR imaging of the right forefoot was performed. No intravenous contrast was administered. COMPARISON:  Radiographs 12/10/2020 FINDINGS: Bones/Joint/Cartilage There is active osteomyelitis of the proximal and middle phalanges of the fourth toe. Lesser edema involves the distal phalanx and the head and proximal shaft of the fourth metatarsal which could also be from early osteomyelitis. Tapered appearance of the proximal phalanx distally along with cortical demineralization suggesting bony destructive findings corroborating the appearance on CT. On STIR image 3 of series 10 there is questionable edema signal in the distal phalanx of the small toe but this is not corroborated on other imaging planes are sequences and is probably due to volume averaging rather than actual marrow edema. There is some faint marrow edema in the proximal metaphysis of the fifth metatarsal on image 17 series 9, nonspecific but unlikely to be infectious, possibly  low-grade stress reaction or from disuse. Ligaments The Lisfranc ligament appears intact. Muscles and Tendons The diffuse nature of the regional muscular edema favors neurogenic edema over myositis although the latter is not completely excluded. Soft tissues Mild intermetatarsal bursitis between the heads of the first and second metatarsals on image twenty-two series 6. Dorsal subcutaneous edema in the foot is nonspecific but may reflect cellulitis. Substantial subcutaneous edema in the fourth toe. IMPRESSION: 1. Active osteomyelitis of the proximal and middle phalanges of the fourth toe. Lesser edema in the distal phalanx and the head of the fourth metatarsal and could be reactive or from early osteomyelitis as well. Substantial subcutaneous edema favoring cellulitis in the fourth toe. 2. Diffuse regional muscular edema favoring neurogenic edema. 3. Mild intermetatarsal bursitis between the heads of the first and second metatarsals. 4. Dorsal subcutaneous edema in the foot is nonspecific but may reflect cellulitis. 5. Subtle marrow edema in the proximal metaphysis of the fifth metatarsal, nonspecific but probably incidental. Electronically Signed   By: Van Clines M.D.   On: 12/11/2020 08:37   US ARTERIAL ABI (SCREENING LOWER EXTREMITY)  Result Date: 12/11/2020 CLINICAL DATA:  Right fourth digit wound EXAM: NONINVASIVE PHYSIOLOGIC VASCULAR STUDY OF BILATERAL LOWER EXTREMITIES TECHNIQUE: Evaluation of both lower extremities were performed at rest, including calculation of ankle-brachial indices with single level Doppler, pressure and pulse volume recording. COMPARISON:  None. FINDINGS: Right ABI:  1.12 Left ABI:  Unable to calculate Right Lower Extremity: Minimally abnormal triphasic arterial waveforms in the posterior tibial artery. However, the dorsalis pedis waveform is abnormal and monophasic suggesting proximal occlusive disease. Left Lower Extremity:  Relatively normal arterial waveforms. 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 The left ankle-brachial index cannot be calculated due to noncompressibility of the arteries. 2. On the right, the ankle-brachial index is calculated at 1.1, however this is likely falsely elevated due to the underlying difficulty with compression. 3. 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 Signed, Criselda Peaches, MD, [facility] Radiology Electronically Signed   By: Jacqulynn Cadet M.D.   On: 12/11/2020 07:49   DG Foot 2 Views Right  Result Date: 12/10/2020 CLINICAL DATA:  Infection. EXAM: RIGHT FOOT - 2 VIEW COMPARISON:  None. FINDINGS: There is erosive changes of the proximal phalanx of the fourth digit which is age indeterminate and may represent sequela of  osteomyelitis. A small triangular bone fragment medial to the navicular noted which is also age indeterminate. Correlation with clinical exam and point tenderness recommended. No dislocation. Diffuse subcutaneous edema. IMPRESSION: 1. Erosive changes of the proximal phalanx of the fourth digit, age indeterminate and may represent sequela of osteomyelitis. 2. Small triangular bone fragment medial to the navicular, also age indeterminate. Electronically Signed   By: Anner Crete M.D.   On: 12/10/2020 00:42   DG Foot Complete Right  Result Date: 12/12/2020 CLINICAL DATA:  Status post amputation of the fourth digit, initial encounter EXAM: RIGHT FOOT COMPLETE - 3+ VIEW COMPARISON:  12/10/2020 FINDINGS: There is been interval amputation of the fourth toe as well as the head of the fourth metatarsal. Mild air in the soft tissues is noted related to the recent surgery. Calcaneal spurring is noted. No new areas of bony erosive change are seen. IMPRESSION: Status post fourth toe amputation. Electronically  Signed   By: Inez Catalina M.D.   On: 12/12/2020 19:36    Subjective: Patient was seen and examined today.  No new complaints.  Per patient she was told that she does not require any emergent vascular procedure as there was a good blood supply to her foot. Confirmed with podiatry and they are recommending outpatient vascular surgery follow-up.  Discharge Exam: Vitals:   12/13/20 1156 12/13/20 1200  BP: 94/62 104/70  Pulse: 84 81  Resp: 17   Temp: 98.5 F (36.9 C)   SpO2: 100%    Vitals:   12/13/20 0339 12/13/20 0839 12/13/20 1156 12/13/20 1200  BP: 120/80 117/77 94/62 104/70  Pulse: 84 85 84 81  Resp: $Remo'16 20 17   'HsKJY$ Temp: 98.2 F (36.8 C) 97.9 F (36.6 C) 98.5 F (36.9 C)   TempSrc: Oral     SpO2: 100% 100% 100%   Weight:      Height:        General: Pt is alert, awake, not in acute distress Cardiovascular: RRR, S1/S2 +, no rubs, no gallops Respiratory: CTA bilaterally, no wheezing, no rhonchi Abdominal: Soft, NT, ND, bowel sounds + Extremities: no edema, no cyanosis, right foot with Ace wrap.   The results of significant diagnostics from this hospitalization (including imaging, microbiology, ancillary and laboratory) are listed below for reference.    Microbiology: Recent Results (from the past 240 hour(s))  Resp Panel by RT-PCR (Flu A&B, Covid) Nasopharyngeal Swab     Status: None   Collection Time: 12/10/20 12:12 AM   Specimen: Nasopharyngeal Swab; Nasopharyngeal(NP) swabs in vial transport medium  Result Value Ref Range Status   SARS Coronavirus 2 by RT PCR NEGATIVE NEGATIVE Final    Comment: (NOTE) SARS-CoV-2 target nucleic acids are NOT DETECTED.  The SARS-CoV-2 RNA is generally detectable in upper respiratory specimens during the acute phase of infection. The lowest concentration of SARS-CoV-2 viral copies this assay can detect is 138 copies/mL. A negative result does not preclude SARS-Cov-2 infection and should not be used as the sole basis for treatment  or other patient management decisions. A negative result may occur with  improper specimen collection/handling, submission of specimen other than nasopharyngeal swab, presence of viral mutation(s) within the areas targeted by this assay, and inadequate number of viral copies(<138 copies/mL). A negative result must be combined with clinical observations, patient history, and epidemiological information. The expected result is Negative.  Fact Sheet for Patients:  EntrepreneurPulse.com.au  Fact Sheet for Healthcare Providers:  IncredibleEmployment.be  This test is no t yet approved or cleared  by the [place] and  has been authorized for detection and/or diagnosis of SARS-CoV-2 by FDA under an Emergency Use Authorization (EUA). This EUA will remain  in effect (meaning this test can be used) for the duration of the COVID-19 declaration under Section 564(b)(1) of the Act, 21 U.S.C.section 360bbb-3(b)(1), unless the authorization is terminated  or revoked sooner.       Influenza A by PCR NEGATIVE NEGATIVE Final   Influenza B by PCR NEGATIVE NEGATIVE Final    Comment: (NOTE) The Xpert Xpress SARS-CoV-2/FLU/RSV plus assay is intended as an aid in the diagnosis of influenza from Nasopharyngeal swab specimens and should not be used as a sole basis for treatment. Nasal washings and aspirates are unacceptable for Xpert Xpress SARS-CoV-2/FLU/RSV testing.  Fact Sheet for Patients: EntrepreneurPulse.com.au  Fact Sheet for Healthcare Providers: IncredibleEmployment.be  This test is not yet approved or cleared by the [place] FDA and has been authorized for detection and/or diagnosis of SARS-CoV-2 by FDA under an Emergency Use Authorization (EUA). This EUA will remain in effect (meaning this test can be used) for the duration of the COVID-19 declaration under Section 564(b)(1) of the Act, 21 U.S.C. section  360bbb-3(b)(1), unless the authorization is terminated or revoked.  Performed at [facility], [address] [place], [place] [address]   MRSA Next Gen by PCR, Nasal     Status: None   Collection Time: 12/10/20 12:24 PM   Specimen: Nasal Mucosa; Nasal Swab  Result Value Ref Range Status   MRSA by PCR Next Gen NOT DETECTED NOT DETECTED Final    Comment: (NOTE) The GeneXpert MRSA Assay (FDA approved for NASAL specimens only), is one component of a comprehensive MRSA colonization surveillance program. It is not intended to diagnose MRSA infection nor to guide or monitor treatment for MRSA infections. Test performance is not FDA approved in patients less than 29 years old. Performed at [facility], [address] [place], [place] [address]   Aerobic/Anaerobic Culture w Gram Stain (surgical/deep wound)     Status: None (Preliminary result)   Collection Time: 12/10/20  1:02 PM   Specimen: Toe; Wound  Result Value Ref Range Status   Specimen Description   Final    TOE Performed at [facility], [address] [place], [place] [address]    Special Requests   Final    NONE Performed at [facility], [place], [place] [address]    Gram Stain   Final    NO SQUAMOUS EPITHELIAL CELLS SEEN FEW WBC SEEN FEW GRAM NEGATIVE RODS FEW GRAM POSITIVE COCCI    Culture   Final    CULTURE REINCUBATED FOR BETTER GROWTH HOLDING FOR POSSIBLE ANAEROBE Performed at [facility], [place] [address], [place], [place] [address]    Report Status PENDING  Incomplete  Aerobic/Anaerobic Culture w Gram Stain (surgical/deep wound)     Status: None (Preliminary result)   Collection Time: 12/12/20  4:37 PM   Specimen: PATH Other; Tissue  Result Value Ref Range Status   Specimen Description   Final    WOUND Performed at [facility], [address], [place], [place] [address]    Special Requests   Final    NONE Performed at [facility], [address] [place] [place],  [address]    Gram Stain   Final    NO SQUAMOUS EPITHELIAL CELLS SEEN NO WBC SEEN NO ORGANISMS SEEN    Culture  Final    NO GROWTH < 12 HOURS Performed at [facility] [address], [place], [place] [address]    Report Status PENDING  Incomplete     Labs: BNP (last 3 results) No results for input(s): BNP in the last 8760 hours. Basic Metabolic Panel: Recent Labs  Lab 12/09/20 1828 12/11/20 0336  NA 137  --   K 3.9  --   CL 112*  --   CO2 19*  --   GLUCOSE 94  --   BUN 36*  --   CREATININE 1.08* 1.04*  CALCIUM 9.0  --    Liver Function Tests: No results for input(s): AST, ALT, ALKPHOS, BILITOT, PROT, ALBUMIN in the last 168 hours. No results for input(s): LIPASE, AMYLASE in the last 168 hours. No results for input(s): AMMONIA in the last 168 hours. CBC: Recent Labs  Lab 12/09/20 1828  WBC 7.9  HGB 9.7*  HCT 31.3*  MCV 92.1  PLT 228   Cardiac Enzymes: No results for input(s): CKTOTAL, CKMB, CKMBINDEX, TROPONINI in the last 168 hours. BNP: Invalid input(s): POCBNP CBG: Recent Labs  Lab 12/12/20 1104 12/12/20 1704 12/12/20 2239 12/13/20 0840 12/13/20 1218  GLUCAP 122* 138* 143* 136* 181*   D-Dimer No results for input(s): DDIMER in the last 72 hours. Hgb A1c No results for input(s): HGBA1C in the last 72 hours. Lipid Profile No results for input(s): CHOL, HDL, LDLCALC, TRIG, CHOLHDL, LDLDIRECT in the last 72 hours. Thyroid function studies No results for input(s): TSH, T4TOTAL, T3FREE, THYROIDAB in the last 72 hours.  Invalid input(s): FREET3 Anemia work up No results for input(s): VITAMINB12, FOLATE, FERRITIN, TIBC, IRON, RETICCTPCT in the last 72 hours. Urinalysis    Component Value Date/Time   COLORURINE YELLOW (A) 07/15/2020 2349   APPEARANCEUR CLEAR (A) 07/15/2020 2349   APPEARANCEUR Cloudy (A) 02/28/2020 1321   LABSPEC 1.011 07/15/2020 2349   LABSPEC 1.037 05/27/2013 1839    PHURINE 6.0 07/15/2020 2349   GLUCOSEU NEGATIVE 07/15/2020 2349   GLUCOSEU >=500 05/27/2013 1839   HGBUR NEGATIVE 07/15/2020 2349   BILIRUBINUR NEGATIVE 07/15/2020 2349   BILIRUBINUR Negative 02/28/2020 1321   BILIRUBINUR Negative 05/27/2013 1839   KETONESUR NEGATIVE 07/15/2020 2349   PROTEINUR 30 (A) 07/15/2020 2349   NITRITE NEGATIVE 07/15/2020 2349   LEUKOCYTESUR NEGATIVE 07/15/2020 2349   LEUKOCYTESUR Negative 05/27/2013 1839   Sepsis Labs Invalid input(s): PROCALCITONIN,  WBC,  LACTICIDVEN Microbiology Recent Results (from the past 240 hour(s))  Resp Panel by RT-PCR (Flu A&B, Covid) Nasopharyngeal Swab     Status: None   Collection Time: 12/10/20 12:12 AM   Specimen: Nasopharyngeal Swab; Nasopharyngeal(NP) swabs in vial transport medium  Result Value Ref Range Status   SARS Coronavirus 2 by RT PCR NEGATIVE NEGATIVE Final    Comment: (NOTE) SARS-CoV-2 target nucleic acids are NOT DETECTED.  The SARS-CoV-2 RNA is generally detectable in upper respiratory specimens during the acute phase of infection. The lowest concentration of SARS-CoV-2 viral copies this assay can detect is 138 copies/mL. A negative result does not preclude SARS-Cov-2 infection and should not be used as the sole basis for treatment or other patient management decisions. A negative result may occur with  improper specimen collection/handling, submission of specimen other than nasopharyngeal swab, presence of viral mutation(s) within the areas targeted by this assay, and inadequate number of viral copies(<138 copies/mL). A negative result must be combined with clinical observations, patient history, and epidemiological information. The expected result is Negative.  Fact Sheet  for Patients:  EntrepreneurPulse.com.au  Fact Sheet for Healthcare Providers:  IncredibleEmployment.be  This test is no t yet approved or cleared by the [place] FDA and  has been authorized  for detection and/or diagnosis of SARS-CoV-2 by FDA under an Emergency Use Authorization (EUA). This EUA will remain  in effect (meaning this test can be used) for the duration of the COVID-19 declaration under Section 564(b)(1) of the Act, 21 U.S.C.section 360bbb-3(b)(1), unless the authorization is terminated  or revoked sooner.       Influenza A by PCR NEGATIVE NEGATIVE Final   Influenza B by PCR NEGATIVE NEGATIVE Final    Comment: (NOTE) The Xpert Xpress SARS-CoV-2/FLU/RSV plus assay is intended as an aid in the diagnosis of influenza from Nasopharyngeal swab specimens and should not be used as a sole basis for treatment. Nasal washings and aspirates are unacceptable for Xpert Xpress SARS-CoV-2/FLU/RSV testing.  Fact Sheet for Patients: EntrepreneurPulse.com.au  Fact Sheet for Healthcare Providers: IncredibleEmployment.be  This test is not yet approved or cleared by the [place] FDA and has been authorized for detection and/or diagnosis of SARS-CoV-2 by FDA under an Emergency Use Authorization (EUA). This EUA will remain in effect (meaning this test can be used) for the duration of the COVID-19 declaration under Section 564(b)(1) of the Act, 21 U.S.C. section 360bbb-3(b)(1), unless the authorization is terminated or revoked.  Performed at [facility], [address] [place], [place] [address]   MRSA Next Gen by PCR, Nasal     Status: None   Collection Time: 12/10/20 12:24 PM   Specimen: Nasal Mucosa; Nasal Swab  Result Value Ref Range Status   MRSA by PCR Next Gen NOT DETECTED NOT DETECTED Final    Comment: (NOTE) The GeneXpert MRSA Assay (FDA approved for NASAL specimens only), is one component of a comprehensive MRSA colonization surveillance program. It is not intended to diagnose MRSA infection nor to guide or monitor treatment for MRSA infections. Test performance is not FDA approved in patients less than 89  years old. Performed at [facility], [address] [place], [place] [address]   Aerobic/Anaerobic Culture w Gram Stain (surgical/deep wound)     Status: None (Preliminary result)   Collection Time: 12/10/20  1:02 PM   Specimen: Toe; Wound  Result Value Ref Range Status   Specimen Description   Final    TOE Performed at [facility], [address], [place], [place] [address]    Special Requests   Final    NONE Performed at [facility], [place], [place] [address]    Gram Stain   Final    NO SQUAMOUS EPITHELIAL CELLS SEEN FEW WBC SEEN FEW GRAM NEGATIVE RODS FEW GRAM POSITIVE COCCI    Culture   Final    CULTURE REINCUBATED FOR BETTER GROWTH HOLDING FOR POSSIBLE ANAEROBE Performed at [facility], [place] [address] [place], [place] [address]    Report Status PENDING  Incomplete  Aerobic/Anaerobic Culture w Gram Stain (surgical/deep wound)     Status: None (Preliminary result)   Collection Time: 12/12/20  4:37 PM   Specimen: PATH Other; Tissue  Result Value Ref Range Status   Specimen Description   Final    WOUND Performed at [facility], [address] [place], [place] [address]    Special Requests   Final    NONE Performed at [facility], [address], [place],  [address]    Gram Stain  Final    NO SQUAMOUS EPITHELIAL CELLS SEEN NO WBC SEEN NO ORGANISMS SEEN    Culture   Final    NO GROWTH < 12 HOURS Performed at [facility], [place] [address] [place], [place] [address]    Report Status PENDING  Incomplete    Time coordinating discharge: Over 30 minutes  SIGNED:  Lorella Nimrod, MD  [facility] 12/13/2020, 3:17 PM  If 7PM-7AM, please contact night-coverage [email or web address]  This record has been created using Systems analyst. Errors have been sought and corrected,but may not always be located. Such creation errors do not reflect on the standard of  care.

## 2020-12-13 NOTE — Anesthesia Postprocedure Evaluation (Signed)
Anesthesia Post Note  Patient: Katelyn Barnes  Procedure(s) Performed: AMPUTATION RAY-4th Ray Partial (Right: Fourth Toe) BONE BIOPSY (Right: Fourth Toe)  Patient location during evaluation: PACU Anesthesia Type: General Level of consciousness: awake and alert Pain management: pain level controlled Vital Signs Assessment: post-procedure vital signs reviewed and stable Respiratory status: spontaneous breathing, nonlabored ventilation and respiratory function stable Cardiovascular status: blood pressure returned to baseline and stable Postop Assessment: no apparent nausea or vomiting Anesthetic complications: no   No notable events documented.   Last Vitals:  Vitals:   12/12/20 2359 12/13/20 0339  BP: 111/68 120/80  Pulse: 80 84  Resp: 18 16  Temp:  36.8 C  SpO2: 98% 100%    Last Pain:  Vitals:   12/13/20 0339  TempSrc: Oral  PainSc:                  [name]

## 2020-12-13 NOTE — Evaluation (Addendum)
Physical Therapy Evaluation Patient Details Name: Katelyn Barnes MRN: [id number] DOB: 04/17/1980 Today's Date: 12/13/2020  History of Present Illness  Pt is a 40 y/o F admitted on 12/09/20 after being sent to the ED by her podiatrist due to concerns for osteomyelitis of the 4th toe of R foot. Pt has completed 2 courses of antibiotics but continues to have increasing pain, redness & swelling. MRI confirms osteomyelitis. 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 PMH: DM, HTN, class 3 obesity with obesity hypoventilation syndrome  Clinical Impression  Pt seen for PT evaluation with PT educating pt on weight bearing precautions & providing demo re: gait with post op shoe. PT also adjusted post op shoe for better fit. Pt is able to complete bed mobility, transfers & gait with RW & mod I. Pt toilets without assistance as well. Will continue to follow pt acutely to increase gait distances/activity tolerance but pt will not require f/u therapy.    Addendum: Pt elects to maintain NWB RLE when ambulating laterally in small spaces with RW, otherwise maintains PWB.     Recommendations for follow up therapy are one component of a multi-disciplinary discharge planning process, led by the attending physician.  Recommendations may be updated based on patient status, additional functional criteria and insurance authorization.  Follow Up Recommendations No PT follow up    Assistance Recommended at Discharge PRN  Functional Status Assessment Patient has had a recent decline in their functional status and demonstrates the ability to make significant improvements in function in a reasonable and predictable amount of time.  Equipment Recommendations  Rolling walker (2 wheels)    Recommendations for Other Services       Precautions / Restrictions Precautions Precautions: Fall Restrictions Weight Bearing Restrictions: Yes RLE Weight Bearing: Partial weight bearing (in post op heel  wedge shoe)      Mobility  Bed Mobility Overal bed mobility: Modified Independent             General bed mobility comments: supine>sit mod I with HOB slightly elevated, bed rails    Transfers Overall transfer level: Modified independent Equipment used: Rolling walker (2 wheels)               General transfer comment: instructional education/cuing for proper hand placement    Ambulation/Gait Ambulation/Gait assistance: Modified independent (Device/Increase time) Gait Distance (Feet): 75 Feet Assistive device: Rolling walker (2 wheels) Gait Pattern/deviations: Step-to pattern;Decreased step length - right;Decreased step length - left;Decreased dorsiflexion - right;Decreased dorsiflexion - left;Decreased stride length Gait velocity: decreased     General Gait Details: Cuing for technique to maintain PWB RLE & gait speed. ~1 standing rest break.  Stairs            Wheelchair Mobility    Modified Rankin (Stroke Patients Only)       Balance Overall balance assessment: Needs assistance Sitting-balance support: Feet supported Sitting balance-Leahy Scale: Normal     Standing balance support: During functional activity;Bilateral upper extremity supported Standing balance-Leahy Scale: Good                               Pertinent Vitals/Pain Pain Assessment: 0-10 Pain Score: 5  Pain Location: R foot Pain Descriptors / Indicators: Discomfort Pain Intervention(s): Monitored during session;Premedicated before session    Home Living Family/patient expects to be discharged to:: Private residence Living Arrangements:  (109 y/o son (has CP) & 10 y/o daughter)  Available Help at Discharge: Family;Friend(s) Type of Home: House Home Access: Ramped entrance       Home Layout: Two level;Able to live on main level with bedroom/bathroom        Prior Function Prior Level of Function : Independent/Modified Independent             Mobility  Comments: Pt reports she was independent without AD, no falls, driving, doesn't work.       Hand Dominance        Extremity/Trunk Assessment   Upper Extremity Assessment Upper Extremity Assessment: Overall WFL for tasks assessed    Lower Extremity Assessment Lower Extremity Assessment: Generalized weakness       Communication   Communication: No difficulties  Cognition Arousal/Alertness: Awake/alert Behavior During Therapy: WFL for tasks assessed/performed Overall Cognitive Status: Within Functional Limits for tasks assessed                                          General Comments General comments (skin integrity, edema, etc.): Pt with continent void on toilet, performed peri hygiene without assistance.    Exercises     Assessment/Plan    PT Assessment Patient needs continued PT services  PT Problem List Decreased mobility;Decreased balance;Decreased activity tolerance;Pain       PT Treatment Interventions DME instruction;Therapeutic activities;Modalities;Gait training;Therapeutic exercise;Balance training;Patient/family education;Neuromuscular re-education;Functional mobility training    PT Goals (Current goals can be found in the Care Plan section)  Acute Rehab PT Goals Patient Stated Goal: go home PT Goal Formulation: With patient Time For Goal Achievement: 12/27/20 Potential to Achieve Goals: Good    Frequency 7X/week   Barriers to discharge Decreased caregiver support      Co-evaluation               AM-PAC PT "6 Clicks" Mobility  Outcome Measure Help needed turning from your back to your side while in a flat bed without using bedrails?: None Help needed moving from lying on your back to sitting on the side of a flat bed without using bedrails?: None Help needed moving to and from a bed to a chair (including a wheelchair)?: None Help needed standing up from a chair using your arms (e.g., wheelchair or bedside chair)?:  None Help needed to walk in hospital room?: None Help needed climbing 3-5 steps with a railing? : A Little 6 Click Score: 23    End of Session Equipment Utilized During Treatment:  (RLE post op shoe) Activity Tolerance: Patient tolerated treatment well Patient left: in chair;with call bell/phone within reach Nurse Communication: Mobility status PT Visit Diagnosis: Other abnormalities of gait and mobility (R26.89)    Time: 1610-9604 PT Time Calculation (min) (ACUTE ONLY): 25 min   Charges:   PT Evaluation $PT Eval Low Complexity: 1 Low PT Treatments $Gait Training: 8-22 mins        Aleda Grana, PT, DPT 12/13/20, 1:54 PM   Sandi Mariscal 12/13/2020, 1:54 PM

## 2020-12-13 NOTE — Progress Notes (Signed)
Pharmacy Antibiotic Note  Katelyn Barnes is a 40 y.o. female admitted on 12/09/2020 with wound infection (diabetic foot).  Pharmacy has been consulted for cefepime and Vancomycin dosing.  **pt failed outpatient doxy course prior to arrival.  Plan: cefepime 2g IV every 8 hours   Continue Vancomycin 1.5g q24h  Estimated AUC: 446 Scr: 1.04, Vd 0.5, Ke w/ IBW  MRSA PCR negative F/u Scr  Pharmacy will continue to follow and adjust dosing when warranted.   Height: 5\' 6"  (167.6 cm) Weight: 112.5 kg (248 lb) IBW/kg (Calculated) : 59.3  Temp (24hrs), Avg:98.3 F (36.8 C), Min:97.9 F (36.6 C), Max:98.7 F (37.1 C)  Recent Labs  Lab 12/09/20 1828 12/11/20 0336  WBC 7.9  --   CREATININE 1.08* 1.04*     Estimated Creatinine Clearance: 90.6 mL/min (A) (by C-G formula based on SCr of 1.04 mg/dL (H)).    Allergies  Allergen Reactions   Macrobid [Nitrofurantoin] Nausea And Vomiting   Penicillins Rash    14 years ago     Antimicrobials this admission: 11/30 Cefepime >> x 1 11/30 Vancomycin >> 11/30 Zosyn >>  12/1 12/1 Cefepime>>   Microbiology results: No lab cx have been ordered or are pending at this time. 11/30 MRSA PCR neg 11/30 toe cx: FEW GRAM NEGATIVE RODS  FEW GRAM POSITIVE COCCI , reincubated, holding for poss anaerobe  Thank you for allowing pharmacy to be a part of this patient's care.  12/30, PharmD Clinical Pharmacist 12/13/2020 10:03 AM

## 2020-12-15 LAB — AEROBIC/ANAEROBIC CULTURE W GRAM STAIN (SURGICAL/DEEP WOUND)
Culture: NORMAL
Gram Stain: NONE SEEN

## 2020-12-16 LAB — SURGICAL PATHOLOGY

## 2020-12-17 LAB — AEROBIC/ANAEROBIC CULTURE W GRAM STAIN (SURGICAL/DEEP WOUND)
Culture: NO GROWTH
Gram Stain: NONE SEEN

## 2021-01-22 ENCOUNTER — Other Ambulatory Visit (INDEPENDENT_AMBULATORY_CARE_PROVIDER_SITE_OTHER): Payer: Self-pay | Admitting: Vascular Surgery

## 2021-01-22 DIAGNOSIS — I739 Peripheral vascular disease, unspecified: Secondary | ICD-10-CM

## 2021-01-23 ENCOUNTER — Ambulatory Visit (INDEPENDENT_AMBULATORY_CARE_PROVIDER_SITE_OTHER): Payer: Medicaid Other

## 2021-01-23 ENCOUNTER — Ambulatory Visit (INDEPENDENT_AMBULATORY_CARE_PROVIDER_SITE_OTHER): Payer: Medicaid Other | Admitting: Vascular Surgery

## 2021-01-23 ENCOUNTER — Other Ambulatory Visit: Payer: Self-pay

## 2021-01-23 VITALS — BP 116/74 | HR 78 | Ht 66.0 in | Wt 244.0 lb

## 2021-01-23 DIAGNOSIS — L97509 Non-pressure chronic ulcer of other part of unspecified foot with unspecified severity: Secondary | ICD-10-CM

## 2021-01-23 DIAGNOSIS — I739 Peripheral vascular disease, unspecified: Secondary | ICD-10-CM

## 2021-01-23 DIAGNOSIS — E11621 Type 2 diabetes mellitus with foot ulcer: Secondary | ICD-10-CM | POA: Diagnosis not present

## 2021-01-23 DIAGNOSIS — I10 Essential (primary) hypertension: Secondary | ICD-10-CM

## 2021-01-23 DIAGNOSIS — M869 Osteomyelitis, unspecified: Secondary | ICD-10-CM | POA: Diagnosis not present

## 2021-01-23 DIAGNOSIS — I96 Gangrene, not elsewhere classified: Secondary | ICD-10-CM | POA: Diagnosis not present

## 2021-01-23 NOTE — Progress Notes (Signed)
Patient ID: Katelyn Barnes, female   DOB: [birth year], 41 y.o.   MRN: [id number]  Chief Complaint  Patient presents with   Follow-up    Follow up, 1 month and doppler     HPI Katelyn Barnes is a 41 y.o. female.  Patient returns in follow-up.  She was seen last month in the hospital with a gangrenous toe which has since been removed and has been healing well.  She was found to have good blood flow at the time of her amputation and no angiogram was done.  We performed formal ABIs today showing a right ABI of 1.21 and a left ABI of 1.42 with triphasic waveforms.  She has normal digital pressures and waveforms in all toes remaining on each foot.   Past Medical History:  Diagnosis Date   Anemia    iron transfusions x 2   COVID-19    DDD (degenerative disc disease), lumbar    Diabetes mellitus without complication ([place])    Type II   DVT (deep venous thrombosis) ([place]) 2016   post back surgery - was on Eliquis 3 months   Kidney stone     Past Surgical History:  Procedure Laterality Date   AMPUTATION Right 12/12/2020   Procedure: AMPUTATION RAY-4th Ray Partial;  Surgeon: Caroline More, DPM;  Location: [facility];  Service: Podiatry;  Laterality: Right;   ANTERIOR CERVICAL DECOMP/DISCECTOMY FUSION Right 05/05/2016   Procedure: ANTERIOR CERVICAL DECOMPRESSION FUSION, CERVICAL 7- THORACIC 1 WITH INSTRUMENTATION AND ALLOGRAFT;  Surgeon: Phylliss Bob, MD;  Location: [facility];  Service: Orthopedics;  Laterality: Right;  ANTERIOR CERVICAL DECOMPRESSION FUSION, CERVICAL 7- THORACIC 1 WITH INSTRUMENTATION AND ALLOGRAFT   BACK SURGERY  2016   discectomy    BONE BIOPSY Right 12/12/2020   Procedure: BONE BIOPSY;  Surgeon: Caroline More, DPM;  Location: [facility];  Service: Podiatry;  Laterality: Right;   CARPAL TUNNEL RELEASE Bilateral    CYSTOSCOPY W/ RETROGRADES Left 07/06/2019   Procedure: CYSTOSCOPY WITH RETROGRADE PYELOGRAM;  Surgeon: Billey Co, MD;  Location: [facility];  Service:  Urology;  Laterality: Left;   CYSTOSCOPY WITH URETEROSCOPY AND STENT PLACEMENT Left 06/11/2019   Procedure: CYSTOSCOPY WITH URETEROSCOPY AND STENT PLACEMENT;  Surgeon: Festus Aloe, MD;  Location: [facility];  Service: Urology;  Laterality: Left;   CYSTOSCOPY WITH URETEROSCOPY AND STENT PLACEMENT Left 07/06/2019   Procedure: CYSTOSCOPY WITH URETEROSCOPY AND STENT exchange;  Surgeon: Billey Co, MD;  Location: [facility];  Service: Urology;  Laterality: Left;   IR NEPHROSTOMY EXCHANGE LEFT  10/19/2019   LAPAROSCOPIC NEPHRECTOMY, HAND ASSISTED Left 04/04/2020   Procedure: HAND ASSISTED LAPAROSCOPIC NEPHRECTOMY;  Surgeon: Billey Co, MD;  Location: [facility];  Service: Urology;  Laterality: Left;   SHOULDER ARTHROSCOPY     TUBAL LIGATION  08/2007     No family history on file.    Social History   Tobacco Use   Smoking status: Never   Smokeless tobacco: Never  Vaping Use   Vaping Use: Never used  Substance Use Topics   Alcohol use: No   Drug use: No     Allergies  Allergen Reactions   Macrobid [Nitrofurantoin] Nausea And Vomiting   Penicillins Rash    14 years ago     Current Outpatient Medications  Medication Sig Dispense Refill   atorvastatin (LIPITOR) 40 MG tablet Take 40 mg by mouth daily.     docusate sodium (COLACE) 100 MG capsule Take 1 capsule (100 mg total) by  mouth 2 (two) times daily. 10 capsule 0   HYDROcodone-acetaminophen (NORCO/VICODIN) 5-325 MG tablet Take 1-2 tablets by mouth every 4 (four) hours as needed for moderate pain. 30 tablet 0   losartan (COZAAR) 50 MG tablet Take 1 tablet (50 mg total) by mouth daily. 30 tablet 0   meclizine (ANTIVERT) 25 MG tablet Take 1 tablet (25 mg total) by mouth 3 (three) times daily as needed for dizziness. 15 tablet 0   metFORMIN (GLUCOPHAGE) 500 MG tablet Take 500 mg by mouth 2 (two) times daily.     Multiple Vitamins-Minerals (MULTIVITAMIN WITH MINERALS) tablet Take 1 tablet by mouth daily.     mupirocin  ointment (BACTROBAN) 2 % Apply 1 application topically 3 (three) times daily.     nystatin cream (MYCOSTATIN) Apply topically 2 (two) times daily. 30 g 0   terbinafine (LAMISIL) 250 MG tablet Take 250 mg by mouth daily.     topiramate (TOPAMAX) 50 MG tablet Take 150 mg by mouth 2 (two) times daily.     insulin isophane & regular human (HUMULIN 70/30 KWIKPEN) (70-30) 100 UNIT/ML KwikPen Inject 40 Units into the skin 2 (two) times daily with a meal.      No current facility-administered medications for this visit.      REVIEW OF SYSTEMS (Negative unless checked)  Constitutional: [] Weight loss  [] Fever  [] Chills Cardiac: [] Chest pain   [] Chest pressure   [] Palpitations   [] Shortness of breath when laying flat   [] Shortness of breath at rest   [] Shortness of breath with exertion. Vascular:  [] Pain in legs with walking   [] Pain in legs at rest   [] Pain in legs when laying flat   [] Claudication   [] Pain in feet when walking  [] Pain in feet at rest  [] Pain in feet when laying flat   [] History of DVT   [] Phlebitis   [] Swelling in legs   [] Varicose veins   [x] Non-healing ulcers Pulmonary:   [] Uses home oxygen   [] Productive cough   [] Hemoptysis   [] Wheeze  [] COPD   [] Asthma Neurologic:  [] Dizziness  [] Blackouts   [] Seizures   [] History of stroke   [] History of TIA  [] Aphasia   [] Temporary blindness   [] Dysphagia   [] Weakness or numbness in arms   [] Weakness or numbness in legs Musculoskeletal:  [] Arthritis   [] Joint swelling   [] Joint pain   [] Low back pain Hematologic:  [] Easy bruising  [] Easy bleeding   [] Hypercoagulable state   [] Anemic  [] Hepatitis Gastrointestinal:  [] Blood in stool   [] Vomiting blood  [] Gastroesophageal reflux/heartburn   [] Abdominal pain Genitourinary:  [] Chronic kidney disease   [] Difficult urination  [] Frequent urination  [] Burning with urination   [] Hematuria Skin:  [] Rashes   [x] Ulcers   [x] Wounds Psychological:  [] History of anxiety   []  History of major  depression.    Physical Exam BP 116/74    Pulse 78    Ht 5\' 6"  (1.676 m)    Wt 244 lb (110.7 kg)    BMI 39.38 kg/m  Gen:  WD/WN, NAD Head: [place]/AT, No temporalis wasting. Prominent temp pulse not noted. Ear/Nose/Throat: Hearing grossly intact, nares w/o erythema or drainage, oropharynx w/o Erythema/Exudate Eyes: Conjunctiva clear, sclera non-icteric  Neck: trachea midline.  No JVD.  Pulmonary:  Good air movement, respirations not labored, no use of accessory muscles  Cardiac: RRR, no JVD Vascular:  Vessel Right Left  Radial Palpable Palpable  DP 2+ 2+  PT 2+ 2+   Gastrointestinal:. No masses, surgical incisions, or scars. Musculoskeletal: M/S 5/5 throughout.  Extremities without ischemic changes.  No deformity or atrophy.  Right fourth toe amputation site healing well.  Trace right lower extremity edema. Neurologic: Sensation grossly intact in extremities.  Symmetrical.  Speech is fluent. Motor exam as listed above. Psychiatric: Judgment intact, Mood & affect appropriate for pt's clinical situation. Dermatologic: No rashes or ulcers noted.  No cellulitis or open wounds.    Radiology No results found.  Labs Recent Results (from the past 2160 hour(s))  CBC     Status: Abnormal   Collection Time: 12/09/20  6:28 PM  Result Value Ref Range   WBC 7.9 4.0 - 10.5 K/uL   RBC 3.40 (L) 3.87 - 5.11 MIL/uL   Hemoglobin 9.7 (L) 12.0 - 15.0 g/dL   HCT 31.3 (L) 36.0 - 46.0 %   MCV 92.1 80.0 - 100.0 fL   MCH 28.5 26.0 - 34.0 pg   MCHC 31.0 30.0 - 36.0 g/dL   RDW 13.1 11.5 - 15.5 %   Platelets 228 150 - 400 K/uL   nRBC 0.0 0.0 - 0.2 %    Comment: Performed at [facility], [address], [place], [place] [id number]  Basic metabolic panel     Status: Abnormal   Collection Time: 12/09/20  6:28 PM  Result Value Ref Range   Sodium 137 135 - 145 mmol/L   Potassium 3.9 3.5 - 5.1 mmol/L   Chloride 112 (H) 98 - 111 mmol/L   CO2 19 (L) 22 - 32 mmol/L    Glucose, Bld 94 70 - 99 mg/dL    Comment: Glucose reference range applies only to samples taken after fasting for at least 8 hours.   BUN 36 (H) 6 - 20 mg/dL   Creatinine, Ser 1.08 (H) 0.44 - 1.00 mg/dL   Calcium 9.0 8.9 - 10.3 mg/dL   GFR, Estimated >60 >60 mL/min    Comment: (NOTE) Calculated using the CKD-EPI Creatinine Equation (2021)    Anion gap 6 5 - 15    Comment: Performed at [facility], [address] [place], [place] [address]  Hemoglobin A1c     Status: Abnormal   Collection Time: 12/09/20  6:28 PM  Result Value Ref Range   Hgb A1c MFr Bld 7.5 (H) 4.8 - 5.6 %    Comment: (NOTE)         Prediabetes: 5.7 - 6.4         Diabetes: >6.4         Glycemic control for adults with diabetes: <7.0    Mean Plasma Glucose 169 mg/dL    Comment: (NOTE) Performed At: [facility] [place], [place] [id number] Rush Farmer MD [id number]   Resp Panel by RT-PCR (Flu A&B, Covid) Nasopharyngeal Swab     Status: None   Collection Time: 12/10/20 12:12 AM   Specimen: Nasopharyngeal Swab; Nasopharyngeal(NP) swabs in vial transport medium  Result Value Ref Range   SARS Coronavirus 2 by RT PCR NEGATIVE NEGATIVE    Comment: (NOTE) SARS-CoV-2 target nucleic acids are NOT DETECTED.  The SARS-CoV-2 RNA is generally detectable in upper respiratory specimens during the acute phase of infection. The lowest concentration of SARS-CoV-2 viral copies this assay can detect is 138 copies/mL. A negative result does not preclude SARS-Cov-2 infection and should not be used as the sole basis for treatment or other patient management decisions. A negative  result may occur with  improper specimen collection/handling, submission of specimen other than nasopharyngeal swab, presence of viral mutation(s) within the areas targeted by this assay, and inadequate number of viral copies(<138 copies/mL). A negative result must be combined with clinical observations, patient  history, and epidemiological information. The expected result is Negative.  Fact Sheet for Patients:  EntrepreneurPulse.com.au  Fact Sheet for Healthcare Providers:  IncredibleEmployment.be  This test is no t yet approved or cleared by the [place] FDA and  has been authorized for detection and/or diagnosis of SARS-CoV-2 by FDA under an Emergency Use Authorization (EUA). This EUA will remain  in effect (meaning this test can be used) for the duration of the COVID-19 declaration under Section 564(b)(1) of the Act, 21 U.S.C.section 360bbb-3(b)(1), unless the authorization is terminated  or revoked sooner.       Influenza A by PCR NEGATIVE NEGATIVE   Influenza B by PCR NEGATIVE NEGATIVE    Comment: (NOTE) The Xpert Xpress SARS-CoV-2/FLU/RSV plus assay is intended as an aid in the diagnosis of influenza from Nasopharyngeal swab specimens and should not be used as a sole basis for treatment. Nasal washings and aspirates are unacceptable for Xpert Xpress SARS-CoV-2/FLU/RSV testing.  Fact Sheet for Patients: EntrepreneurPulse.com.au  Fact Sheet for Healthcare Providers: IncredibleEmployment.be  This test is not yet approved or cleared by the [place] FDA and has been authorized for detection and/or diagnosis of SARS-CoV-2 by FDA under an Emergency Use Authorization (EUA). This EUA will remain in effect (meaning this test can be used) for the duration of the COVID-19 declaration under Section 564(b)(1) of the Act, 21 U.S.C. section 360bbb-3(b)(1), unless the authorization is terminated or revoked.  Performed at [facility], [address] [place], [place] [address]   CBG monitoring, ED     Status: Abnormal   Collection Time: 12/10/20  2:26 AM  Result Value Ref Range   Glucose-Capillary 44 (LL) 70 - 99 mg/dL    Comment: Glucose reference range applies only to samples taken after fasting  for at least 8 hours.   Comment 1 Document in Chart    Comment 2 Repeat Test    Comment 3 Call MD NNP PA CNM   CBG monitoring, ED     Status: Abnormal   Collection Time: 12/10/20  2:28 AM  Result Value Ref Range   Glucose-Capillary 56 (L) 70 - 99 mg/dL    Comment: Glucose reference range applies only to samples taken after fasting for at least 8 hours.  CBG monitoring, ED     Status: Abnormal   Collection Time: 12/10/20  3:34 AM  Result Value Ref Range   Glucose-Capillary 121 (H) 70 - 99 mg/dL    Comment: Glucose reference range applies only to samples taken after fasting for at least 8 hours.  HIV Antibody (routine testing w rflx)     Status: None   Collection Time: 12/10/20  6:52 AM  Result Value Ref Range   HIV Screen 4th Generation wRfx Non Reactive Non Reactive    Comment: Performed at [facility], [address], [place], [place] [address]  CBG monitoring, ED     Status: Abnormal   Collection Time: 12/10/20  7:34 AM  Result Value Ref Range   Glucose-Capillary 129 (H) 70 - 99 mg/dL    Comment: Glucose reference range applies only to samples taken after fasting for at least 8 hours.  CBG monitoring, ED     Status: Abnormal   Collection Time:  12/10/20  9:03 AM  Result Value Ref Range   Glucose-Capillary 138 (H) 70 - 99 mg/dL    Comment: Glucose reference range applies only to samples taken after fasting for at least 8 hours.  CBG monitoring, ED     Status: Abnormal   Collection Time: 12/10/20 11:58 AM  Result Value Ref Range   Glucose-Capillary 195 (H) 70 - 99 mg/dL    Comment: Glucose reference range applies only to samples taken after fasting for at least 8 hours.  MRSA Next Gen by PCR, Nasal     Status: None   Collection Time: 12/10/20 12:24 PM   Specimen: Nasal Mucosa; Nasal Swab  Result Value Ref Range   MRSA by PCR Next Gen NOT DETECTED NOT DETECTED    Comment: (NOTE) The GeneXpert MRSA Assay (FDA approved for NASAL specimens only), is one component of a  comprehensive MRSA colonization surveillance program. It is not intended to diagnose MRSA infection nor to guide or monitor treatment for MRSA infections. Test performance is not FDA approved in patients less than 26 years old. Performed at [facility], [address] [place], [place] [address]   Aerobic/Anaerobic Culture w Gram Stain (surgical/deep wound)     Status: None   Collection Time: 12/10/20  1:02 PM   Specimen: Toe; Wound  Result Value Ref Range   Specimen Description      TOE Performed at [facility], [address], [place], [place] [address]    Special Requests      NONE Performed at [facility], [place], [place] [address]    Gram Stain      NO SQUAMOUS EPITHELIAL CELLS SEEN FEW WBC SEEN FEW GRAM NEGATIVE RODS FEW GRAM POSITIVE COCCI    Culture      FEW NORMAL SKIN FLORA NO GROUP A STREP (S.PYOGENES) ISOLATED NO STAPHYLOCOCCUS AUREUS ISOLATED NO ANAEROBES ISOLATED Performed at [facility], [place] [address], [place], [place] [address]    Report Status 12/15/2020 FINAL   Glucose, capillary     Status: None   Collection Time: 12/10/20  4:15 PM  Result Value Ref Range   Glucose-Capillary 96 70 - 99 mg/dL    Comment: Glucose reference range applies only to samples taken after fasting for at least 8 hours.   Comment 1 Notify RN    Comment 2 Document in Chart   Glucose, capillary     Status: Abnormal   Collection Time: 12/10/20  9:08 PM  Result Value Ref Range   Glucose-Capillary 114 (H) 70 - 99 mg/dL    Comment: Glucose reference range applies only to samples taken after fasting for at least 8 hours.  Creatinine, serum     Status: Abnormal   Collection Time: 12/11/20  3:36 AM  Result Value Ref Range   Creatinine, Ser 1.04 (H) 0.44 - 1.00 mg/dL   GFR, Estimated >60 >60 mL/min    Comment: (NOTE) Calculated using the CKD-EPI Creatinine Equation (2021) Performed at [facility], [address] [place], [place] [address]   Glucose, capillary     Status: Abnormal   Collection Time: 12/11/20  7:41 AM  Result Value Ref Range   Glucose-Capillary 111 (H) 70 - 99 mg/dL    Comment: Glucose reference range applies only to samples taken after fasting for at least 8 hours.  Glucose, capillary     Status: Abnormal   Collection Time: 12/11/20 12:29 PM  Result Value Ref Range  Glucose-Capillary 130 (H) 70 - 99 mg/dL    Comment: Glucose reference range applies only to samples taken after fasting for at least 8 hours.  Glucose, capillary     Status: Abnormal   Collection Time: 12/11/20  4:19 PM  Result Value Ref Range   Glucose-Capillary 154 (H) 70 - 99 mg/dL    Comment: Glucose reference range applies only to samples taken after fasting for at least 8 hours.  Glucose, capillary     Status: Abnormal   Collection Time: 12/11/20 10:34 PM  Result Value Ref Range   Glucose-Capillary 142 (H) 70 - 99 mg/dL    Comment: Glucose reference range applies only to samples taken after fasting for at least 8 hours.  Glucose, capillary     Status: Abnormal   Collection Time: 12/12/20  7:29 AM  Result Value Ref Range   Glucose-Capillary 155 (H) 70 - 99 mg/dL    Comment: Glucose reference range applies only to samples taken after fasting for at least 8 hours.  Glucose, capillary     Status: Abnormal   Collection Time: 12/12/20 11:04 AM  Result Value Ref Range   Glucose-Capillary 122 (H) 70 - 99 mg/dL    Comment: Glucose reference range applies only to samples taken after fasting for at least 8 hours.  Surgical pathology     Status: None   Collection Time: 12/12/20  4:37 PM  Result Value Ref Range   SURGICAL PATHOLOGY      SURGICAL PATHOLOGY CASE: [id number] PATIENT: Cecille Amsterdam Surgical Pathology Report     Specimen Submitted: A. 4th metatarsal bone, right proximal margin B. 4th metatarsal, right  Clinical History: Partial 4th ray amptuation.      DIAGNOSIS: A. 4TH METATARSAL  BONE, RIGHT, PROXIMAL MARGIN; EXCISION: - BENIGN BONE. - NEGATIVE FOR ACTIVE INFLAMMATION AND MALIGNANCY.  B. 4TH METATARSAL BONE, RIGHT; EXCISION - ACUTE OSTEOMYELITIS. - SKIN AND SOFT TISSUE WITH ULCERATION AND ABSCESS. - NEGATIVE FOR MALIGNANCY.  GROSS DESCRIPTION: A. Labeled: Right fourth metatarsal bone biopsy, proximal margin in purple ink Received: Formalin Collection time: 4:37 PM on 12/12/2020 Placed into formalin time: 4:42 PM on 12/12/2020 Tissue fragment(s): 1 Size: 1.7 x 1.4 x 1.1 cm Description: Received is a bony fragment of tissue with 1 flat margin and 1 smooth, glistening margin.  The flat margin is inked blue. Entirely submitted in 2 cassettes.  Tissue decalcification: C assettes 1 and 2  B. Labeled: Right fourth metatarsal Received: Fresh Collection time: 4:37 PM on 12/12/2020 Placed into formalin time: 5:35 PM on 12/12/2020 Size: 4.4 x 2.0 x 1.8 cm Description of lesion(s): Received is a toe disarticulated at the metatarsal.  There is a 0.6 x 0.4 cm ulcer located 0.5 cm from the proximal margin. Proximal margin: The proximal margin is inked black.  The skin and soft tissue are grossly viable.  The bone at the margin is tan-brown and softened Bone: The bone underlying the ulcer is brown-yellow and softened Other findings: None noted  Block summary: 1 -perpendicular sections of bone at margin 2-ulcer to closest margin and underlying bone  Tissue decalcification: Cassettes 1 and 2  [facility] 12/15/2020  Final Diagnosis performed by Betsy Pries, MD.   Electronically signed 12/16/2020 9:33:16AM The electronic signature indicates that the named Attending Pathologist has evaluated the specimen Technical component performed at [facility], [address], [place], [place] [address] Lab: [id number] Dir: Rush Farmer, MD, MMM  Professional component performed at [facility], [facility], [place],  [place] [address] Lab:  [phone] Dir: Kathi Simpers, MD   Aerobic/Anaerobic Culture w Gram Stain (surgical/deep wound)     Status: None   Collection Time: 12/12/20  4:37 PM   Specimen: PATH Other; Tissue  Result Value Ref Range   Specimen Description      WOUND Performed at [facility], [address] [place], [place] [address]    Special Requests      NONE Performed at [facility], [place], [place] [address]    Gram Stain      NO SQUAMOUS EPITHELIAL CELLS SEEN NO WBC SEEN NO ORGANISMS SEEN    Culture      No growth aerobically or anaerobically. Performed at [facility], [place] [address] [place],  [address]    Report Status 12/17/2020 FINAL   Glucose, capillary     Status: Abnormal   Collection Time: 12/12/20  5:04 PM  Result Value Ref Range   Glucose-Capillary 138 (H) 70 - 99 mg/dL    Comment: Glucose reference range applies only to samples taken after fasting for at least 8 hours.  Glucose, capillary     Status: Abnormal   Collection Time: 12/12/20 10:39 PM  Result Value Ref Range   Glucose-Capillary 143 (H) 70 - 99 mg/dL    Comment: Glucose reference range applies only to samples taken after fasting for at least 8 hours.   Comment 1 Notify RN   Glucose, capillary     Status: Abnormal   Collection Time: 12/13/20  8:40 AM  Result Value Ref Range   Glucose-Capillary 136 (H) 70 - 99 mg/dL    Comment: Glucose reference range applies only to samples taken after fasting for at least 8 hours.  Glucose, capillary     Status: Abnormal   Collection Time: 12/13/20 12:18 PM  Result Value Ref Range   Glucose-Capillary 181 (H) 70 - 99 mg/dL    Comment: Glucose reference range applies only to samples taken after fasting for at least 8 hours.    Assessment/Plan:  Gangrene of toe of right foot ([place]) Status post right fourth toe amputation which is healing well.  Perfusion is adequate.  No further vascular work-up is planned at this  time.  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 Also, involved in wound healing. On appropriate medications.   Essential hypertension .blood pressure control important in reducing the progression of atherosclerotic disease. On appropriate oral medications.      Leotis Pain 01/23/2021, 12:23 PM   This note was created with Dragon medical transcription system.  Any errors from dictation are unintentional.

## 2021-01-23 NOTE — Assessment & Plan Note (Signed)
Status post right fourth toe amputation which is healing well.  Perfusion is adequate.  No further vascular work-up is planned at this time.

## 2021-01-23 NOTE — Assessment & Plan Note (Signed)
blood pressure control important in reducing the progression of atherosclerotic disease. On appropriate oral medications.  

## 2021-01-23 NOTE — Assessment & Plan Note (Signed)
blood glucose control important in reducing the progression of atherosclerotic disease. Also, involved in wound healing. On appropriate medications.  

## 2021-01-23 NOTE — Assessment & Plan Note (Signed)
Now s/p amputation of toe

## 2021-05-08 ENCOUNTER — Emergency Department
Admission: EM | Admit: 2021-05-08 | Discharge: 2021-05-08 | Disposition: A | Discharge status: Home / Self Care | Payer: Medicaid Other | Attending: Emergency Medicine | Admitting: Emergency Medicine

## 2021-05-08 ENCOUNTER — Telehealth: Payer: Self-pay

## 2021-05-08 ENCOUNTER — Emergency Department: Payer: Medicaid Other

## 2021-05-08 ENCOUNTER — Other Ambulatory Visit: Payer: Self-pay

## 2021-05-08 DIAGNOSIS — R1110 Vomiting, unspecified: Secondary | ICD-10-CM | POA: Diagnosis present

## 2021-05-08 DIAGNOSIS — N179 Acute kidney failure, unspecified: Secondary | ICD-10-CM | POA: Diagnosis not present

## 2021-05-08 DIAGNOSIS — R7989 Other specified abnormal findings of blood chemistry: Secondary | ICD-10-CM | POA: Diagnosis not present

## 2021-05-08 DIAGNOSIS — R197 Diarrhea, unspecified: Secondary | ICD-10-CM | POA: Insufficient documentation

## 2021-05-08 DIAGNOSIS — R109 Unspecified abdominal pain: Secondary | ICD-10-CM | POA: Diagnosis not present

## 2021-05-08 LAB — URINALYSIS, ROUTINE W REFLEX MICROSCOPIC
Bilirubin Urine: NEGATIVE
Glucose, UA: NEGATIVE mg/dL
Hgb urine dipstick: NEGATIVE
Ketones, ur: NEGATIVE mg/dL
Leukocytes,Ua: NEGATIVE
Nitrite: NEGATIVE
Protein, ur: NEGATIVE mg/dL
Specific Gravity, Urine: 1.002 — ABNORMAL LOW (ref 1.005–1.030)
pH: 5 (ref 5.0–8.0)

## 2021-05-08 LAB — CBC
HCT: 34.5 % — ABNORMAL LOW (ref 36.0–46.0)
Hemoglobin: 11.1 g/dL — ABNORMAL LOW (ref 12.0–15.0)
MCH: 28.3 pg (ref 26.0–34.0)
MCHC: 32.2 g/dL (ref 30.0–36.0)
MCV: 88 fL (ref 80.0–100.0)
Platelets: 131 10*3/uL — ABNORMAL LOW (ref 150–400)
RBC: 3.92 MIL/uL (ref 3.87–5.11)
RDW: 13.8 % (ref 11.5–15.5)
WBC: 6.7 10*3/uL (ref 4.0–10.5)
nRBC: 0 % (ref 0.0–0.2)

## 2021-05-08 LAB — POC URINE PREG, ED: Preg Test, Ur: NEGATIVE

## 2021-05-08 LAB — LIPASE, BLOOD: Lipase: 47 U/L (ref 11–51)

## 2021-05-08 LAB — COMPREHENSIVE METABOLIC PANEL
ALT: 26 U/L (ref 0–44)
AST: 26 U/L (ref 15–41)
Albumin: 4.7 g/dL (ref 3.5–5.0)
Alkaline Phosphatase: 30 U/L — ABNORMAL LOW (ref 38–126)
Anion gap: 7 (ref 5–15)
BUN: 35 mg/dL — ABNORMAL HIGH (ref 6–20)
CO2: 18 mmol/L — ABNORMAL LOW (ref 22–32)
Calcium: 9.2 mg/dL (ref 8.9–10.3)
Chloride: 110 mmol/L (ref 98–111)
Creatinine, Ser: 1.45 mg/dL — ABNORMAL HIGH (ref 0.44–1.00)
GFR, Estimated: 46 mL/min — ABNORMAL LOW (ref >60)
Glucose, Bld: 104 mg/dL — ABNORMAL HIGH (ref 70–99)
Potassium: 3.9 mmol/L (ref 3.5–5.1)
Sodium: 135 mmol/L (ref 135–145)
Total Bilirubin: 1 mg/dL (ref 0.3–1.2)
Total Protein: 8.1 g/dL (ref 6.5–8.1)

## 2021-05-08 LAB — MONONUCLEOSIS SCREEN: Mono Screen: NEGATIVE

## 2021-05-08 LAB — PREGNANCY, URINE: Preg Test, Ur: NEGATIVE

## 2021-05-08 MED ORDER — DROPERIDOL 2.5 MG/ML IJ SOLN
1.2500 mg | Freq: Once | INTRAMUSCULAR | Status: AC
  Administered 2021-05-08: 1.25 mg via INTRAVENOUS
  Filled 2021-05-08: qty 2

## 2021-05-08 MED ORDER — SODIUM CHLORIDE 0.9 % IV BOLUS
1000.0000 mL | Freq: Once | INTRAVENOUS | Status: AC
  Administered 2021-05-08: 1000 mL via INTRAVENOUS

## 2021-05-08 NOTE — ED Triage Notes (Signed)
Pt states that she was seen by her PCP yesterday and had bloodwork done and was told her kidney functions had gotten worse- pt called her urologist and was instructed to come here- pt also endorses abd pain x1 week ?

## 2021-05-08 NOTE — Telephone Encounter (Signed)
I dont think she needs to go to ER from my perspective- CT 05/01/21 normal with no hydronephrosis or blockage in her remaining solitary right kidney. She had a decrease in kidney function on most recent labs, but would not expect this to be blockage related based on the normal CT last week, and kidney function was essentially normal last week. ? ?Obviously if she is having fever or abdominal pain or other symptoms that would warrant ER evaluation she should be seen, but from my perspective, PCP can just recheck renal function next week if she is feeling okay. ? ?Legrand Rams, MD ?05/08/2021 ? ?

## 2021-05-08 NOTE — ED Provider Notes (Signed)
? ?[facility] ?Provider Note ? ? ? Event Date/Time  ? First MD Initiated Contact with Patient 05/08/21 1644   ?  (approximate) ? ? ?History  ? ?Abdominal Pain and Abnormal Lab ? ? ?HPI ? ?Katelyn Barnes is a 41 y.o. female with diabetes, DVT, nephrolithiasis, DDD, anemia, prior left renal abscess status post nephrectomy who came in for concerns for elevated creatinine.  Patient called her urologist who told her to come into the ER to be evaluated due to continued pain.  Patient reporting pain in her right side of her abdomen up into her right back.  This is where she has her 1 kidney.  She denies any chest pain, shortness of breath or other concerns.  She does report multiple episodes of diarrhea but denies any recent antibiotic use.  She also reports nausea but is getting better with taking Phenergan at home. ? ? ?I reviewed patient's creatinine from yesterday and it was 1.9  ? ?Patient was seen on 4/21 for abdominal pain at [facility] as well.  They sent a prescription of Phenergan to the pharmacy. ? ?Physical Exam  ? ?Triage Vital Signs: ?ED Triage Vitals  ?Enc Vitals Group  ?   BP 05/08/21 1625 (!) 141/83  ?   Pulse Rate 05/08/21 1625 88  ?   Resp 05/08/21 1625 18  ?   Temp 05/08/21 1625 98.3 ?F (36.8 ?C)  ?   Temp Source 05/08/21 1625 Oral  ?   SpO2 05/08/21 1625 100 %  ?   Weight 05/08/21 1626 233 lb (105.7 kg)  ?   Height 05/08/21 1626 5\' 6"  (1.676 m)  ?   Head Circumference --   ?   Peak Flow --   ?   Pain Score 05/08/21 1626 7  ?   Pain Loc --   ?   Pain Edu? --   ?   Excl. in [place]? --   ? ? ?Most recent vital signs: ?Vitals:  ? 05/08/21 1625  ?BP: (!) 141/83  ?Pulse: 88  ?Resp: 18  ?Temp: 98.3 ?F (36.8 ?C)  ?SpO2: 100%  ? ? ? ?General: Awake, no distress.  ?CV:  Good peripheral perfusion.  ?Resp:  Normal effort.  ?Abd:  No distention.  Patient reports some tenderness in her right back.  Her abdomen is pretty soft and nontender on examination. ?Other:   ? ? ?ED Results /  Procedures / Treatments  ? ?Labs ?(all labs ordered are listed, but only abnormal results are displayed) ?Labs Reviewed  ?LIPASE, BLOOD  ?COMPREHENSIVE METABOLIC PANEL  ?CBC  ?URINALYSIS, ROUTINE W REFLEX MICROSCOPIC  ?POC URINE PREG, ED  ? ? ?RADIOLOGY ?I have reviewed the CT personally without evidence of kidney stone ? ?IMPRESSION: ?There is no evidence of intestinal obstruction or pneumoperitoneum. ?There is no hydronephrosis in the right kidney. Appendix is not ?dilated. ?  ?Status post left nephrectomy. Enlarged liver and spleen. ?Diverticulosis of colon without signs of focal diverticulitis. ?  ?Lumbar spondylosis with spinal stenosis and encroachment of neural ?foramina at L3-L4 and L4-L5 levels. ?  ?Other findings as described in the body of the report. ?  ? ?PROCEDURES: ? ?Critical Care performed: No ? ?Procedures ? ? ?MEDICATIONS ORDERED IN ED: ?Medications - No data to display ? ? ?IMPRESSION / MDM / ASSESSMENT AND PLAN / ED COURSE  ?I reviewed the triage vital signs and the nursing notes. ? ? ?Patient comes in for evaluation from her urologist due to elevated  kidney function.  Patient's abdomen does have a little bit of right renal pain so we will get repeat CT scan just to make sure no evolving abscess, kidney stone, appendicitis.  We will hold off on contrast due to concern for her elevated creatinine. ? ?Pregnancy test negative.  Lipase negative.  Urine creatinine is up to 1.45 baseline around 1.1.  Patient getting IV fluid.  Bicarb slightly low.  Denies aspirin use. CBC shows normal white count.  Hemoglobin stable.  UA without evidence of UTI.  CT was reassuring. ? ?CT did show some enlarged liver and spleen so we discussed holding off on any sports or trauma to the abdomen.  Add on monotest.  Patient denies any recent travel out of the states, drinking like water or recent antibiotic use.  Offered to try to get stool studies but patient has not been able to give any stools here in the emergency  room so we will hold off. ? ?Patient is tolerating p.o. and already has nausea medicine at home.  She feels comfortable with discharge home and will follow-up for kidney function rechecked in a few days and continue to drink plenty of fluid. ? ? ?  ? ? ?FINAL CLINICAL IMPRESSION(S) / ED DIAGNOSES  ? ?Final diagnoses:  ?AKI (acute kidney injury) ([place])  ?Vomiting, unspecified vomiting type, unspecified whether nausea present  ? ? ? ?Rx / DC Orders  ? ?ED Discharge Orders   ? ? None  ? ?  ? ? ? ?Note:  This document was prepared using Dragon voice recognition software and may include unintentional dictation errors. ?  ?Vanessa Bazine, MD ?05/08/21 2052 ? ?

## 2021-05-08 NOTE — Telephone Encounter (Signed)
Called pt informed her of the information below from Dr. Richardo Hanks, pt states that she is having abdominal pain, denies fever, or chills. Encouraged pt to go to ED immediately for evaluation. Pt agrees.  ?

## 2021-05-08 NOTE — Discharge Instructions (Signed)
Please follow-up with your primary care doctor for kidney function recheck.  Today it was 1.45 so getting better from yesterday.  Continue to drink plenty of fluids.  You can drink Gatorade without sugar or Pedialyte. ? ?There is no evidence of intestinal obstruction or pneumoperitoneum. ?There is no hydronephrosis in the right kidney. Appendix is not ?dilated. ?  ?Status post left nephrectomy. Enlarged liver and spleen. ?Diverticulosis of colon without signs of focal diverticulitis. ?  ?Lumbar spondylosis with spinal stenosis and encroachment of neural ?foramina at L3-L4 and L4-L5 levels. ?  ?Other findings as described in the body of the report. ?  ?  ?

## 2021-05-08 NOTE — ED Notes (Signed)
Pt tolerated PO challenge

## 2021-05-08 NOTE — ED Notes (Signed)
Dc ppw provided to patient. Pt denies any questions at this time. Pt followup reviewed. Pt ambulatory off unit on foot alert and oriented x4. ?

## 2021-05-08 NOTE — Telephone Encounter (Signed)
Incoming call from pt who states that she had labs yesterday with her PCP at [facility], they have advised her that her kidney function is decreasing and recommend she seek care in the ER. Patient would like you to review her labs for a second opinion. Please advise.  ?

## 2021-05-08 NOTE — ED Notes (Signed)
Attempted to draw blood, unsuccessful 

## 2021-11-09 ENCOUNTER — Encounter (INDEPENDENT_AMBULATORY_CARE_PROVIDER_SITE_OTHER): Payer: Self-pay

## 2022-07-01 IMAGING — CT CT RENAL STONE PROTOCOL
2 of 4 series · 16 of 46 positions shown, 18 images · non-contrast
Comparison: September 05, 2019

CLINICAL DATA: CT-guided drain placed into a left renal abscess
September 06, 2019. Sepsis. Drain output 150 cc in last 24 hours.
Culture demonstrated coli. Patient on antibiotics.

EXAM:
CT ABDOMEN AND PELVIS WITHOUT CONTRAST
TECHNIQUE: Multidetector CT imaging of the abdomen and pelvis was performed
following the standard protocol without IV contrast.

[Series 2: stone full standard · axial · 0.98mm/px · z∈[-598,-83]mm · 13 of 113 slices shown, 15 images]
[im 5/113  soft-tissue]
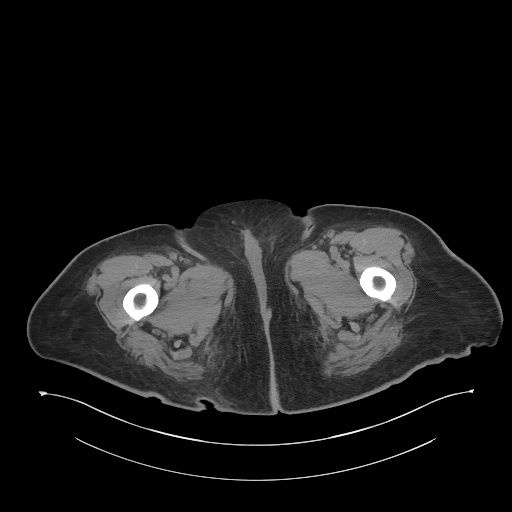
[im 5/113  bone]
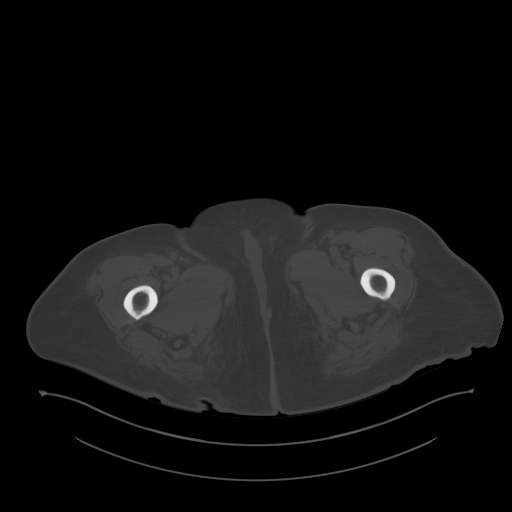
[im 14/113  soft-tissue]
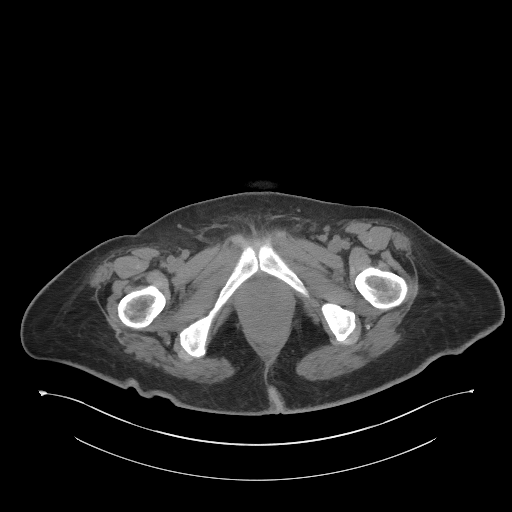
[im 23/113  soft-tissue]
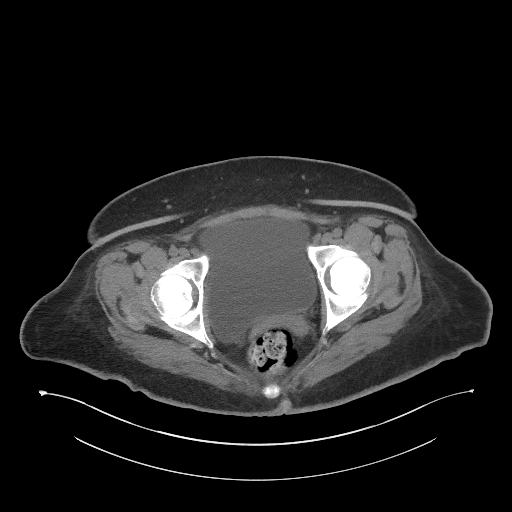
[im 32/113  soft-tissue]
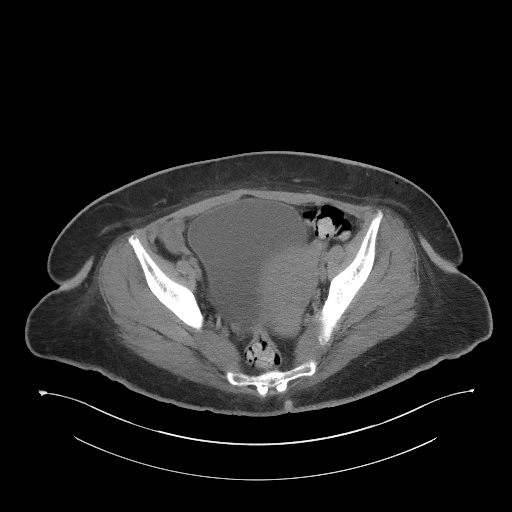
[im 41/113  soft-tissue]
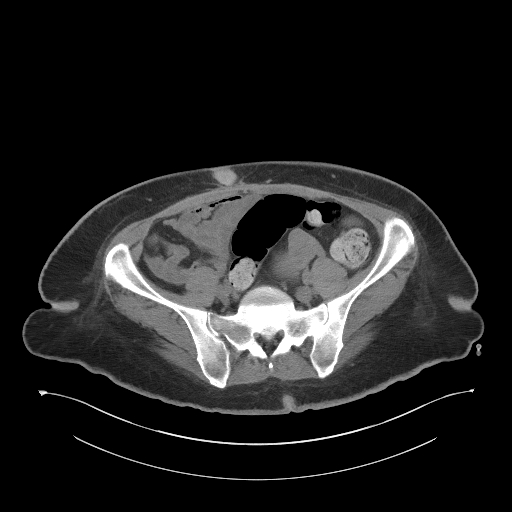
[im 50/113  soft-tissue]
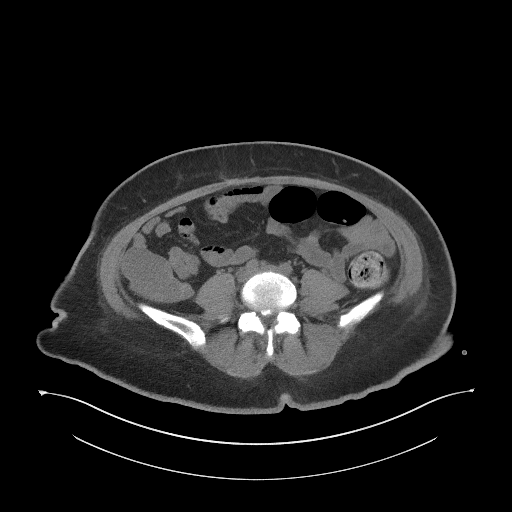
[im 59/113  soft-tissue]
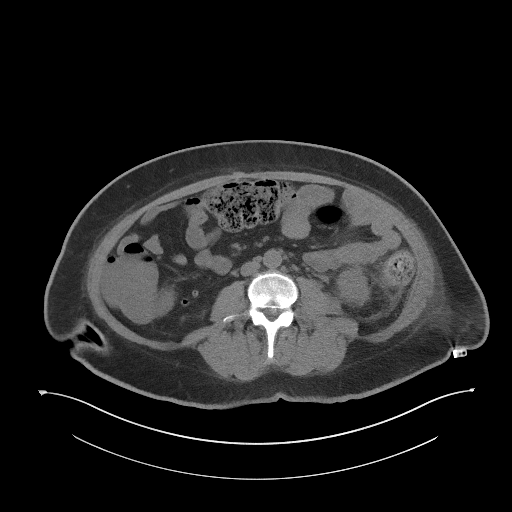
[im 63/113  soft-tissue]
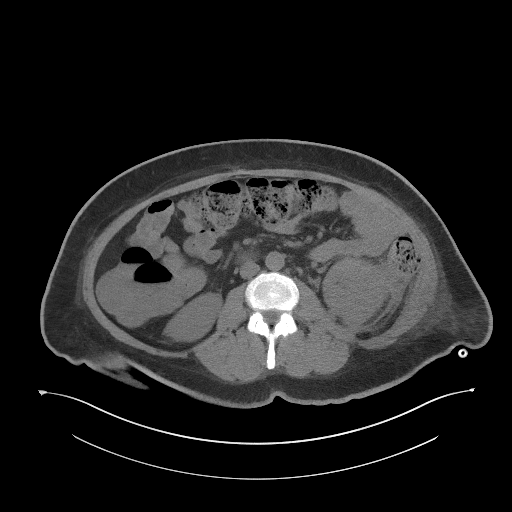
[im 72/113  soft-tissue]
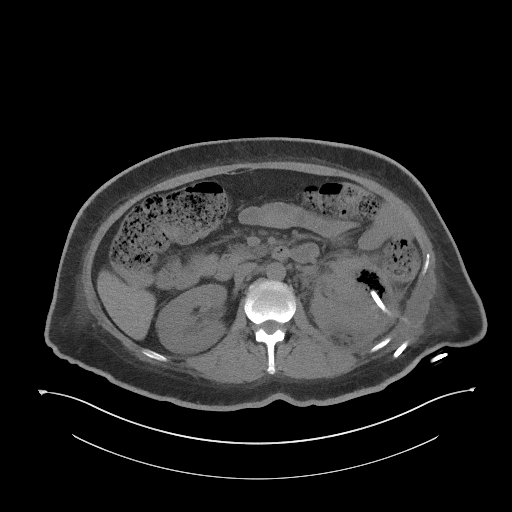
[im 72/113  bone]
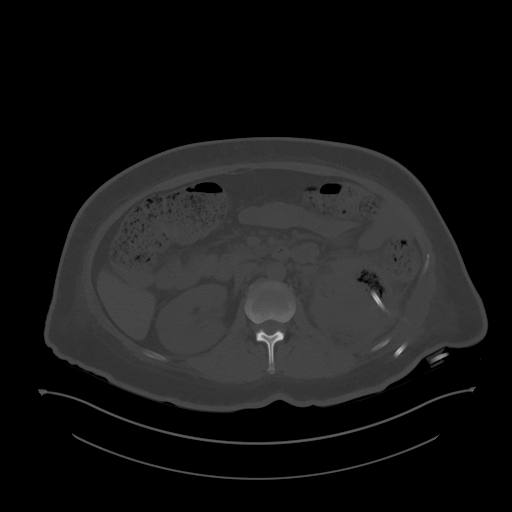
[im 81/113  soft-tissue]
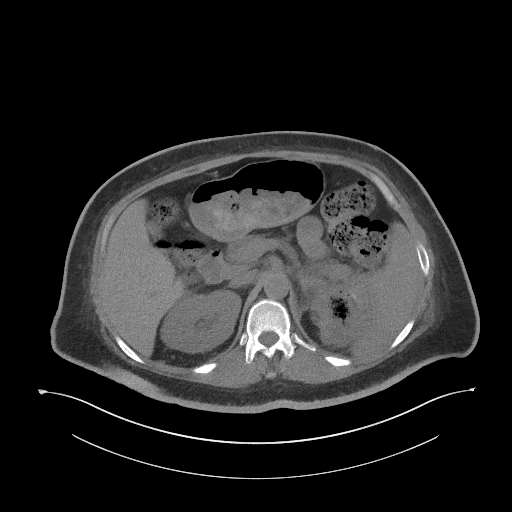
[im 90/113  soft-tissue]
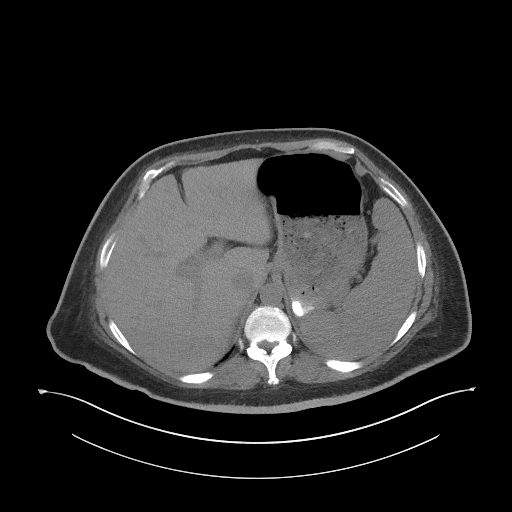
[im 99/113  soft-tissue]
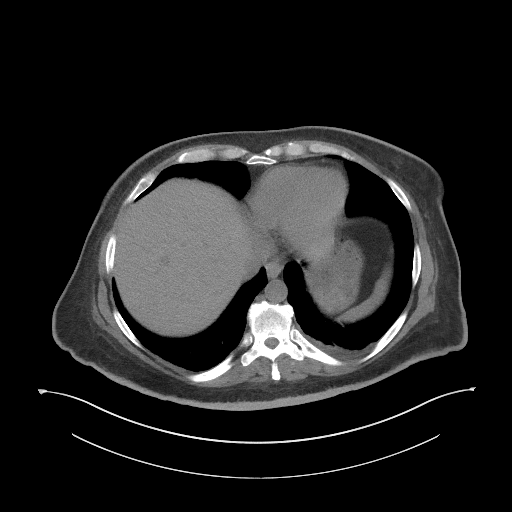
[im 108/113  soft-tissue]
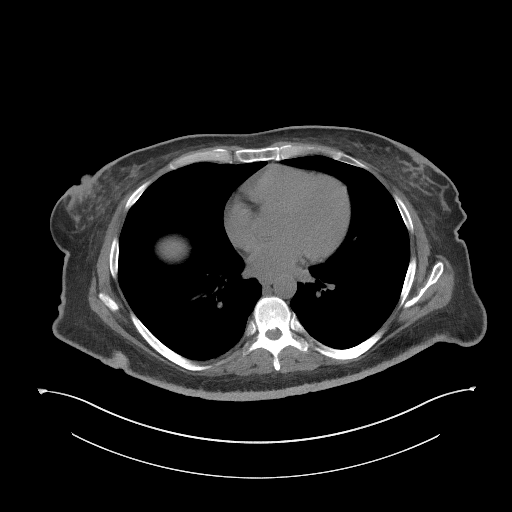

[Series 5: coronal · coronal · 0.93mm/px · 3 of 144 slices shown]
[im 48/144  soft-tissue]
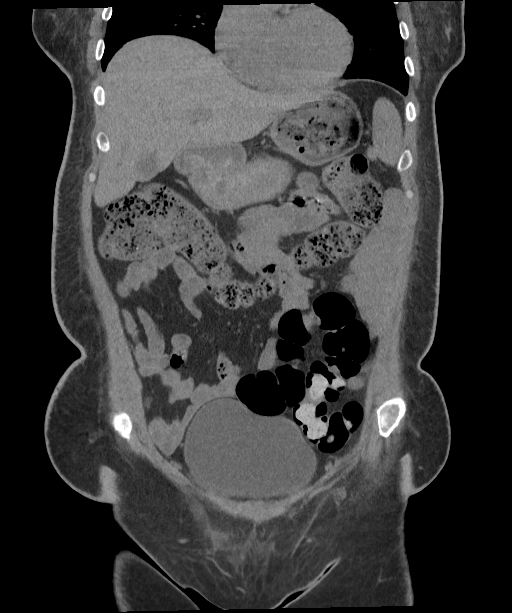
[im 64/144  soft-tissue]
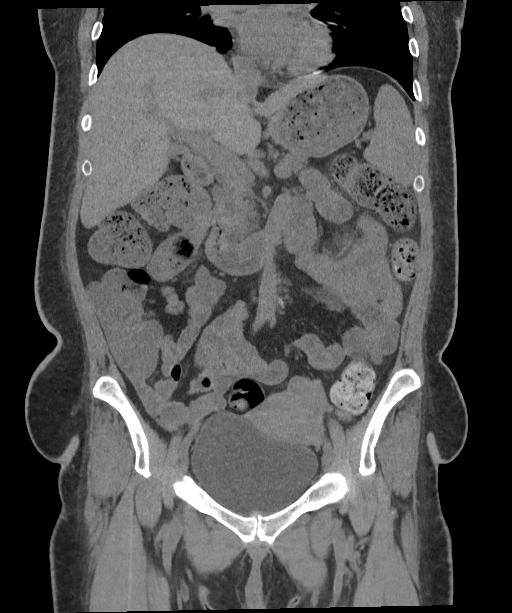
[im 80/144  soft-tissue]
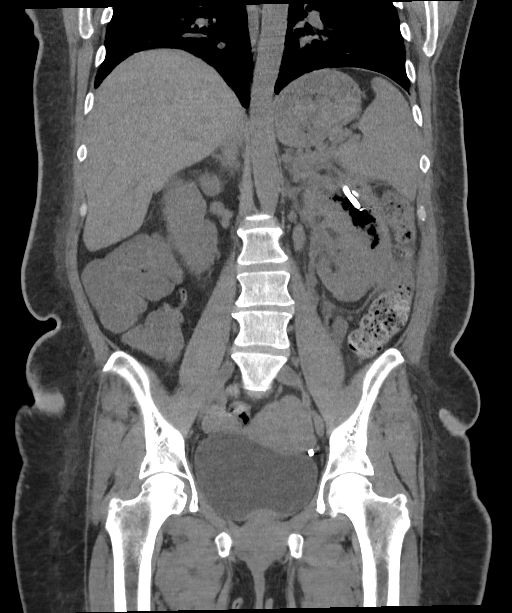

[16 of 46 positions shown; findings below may reference images not displayed]

FINDINGS: Lower chest: Tiny left pleural effusion. Mild bibasilar atelectasis.
The lower chest/lower lungs otherwise normal.

Hepatobiliary: No focal liver abnormality is seen. No gallstones,
gallbladder wall thickening, or biliary dilatation.

Pancreas: Unremarkable. No pancreatic ductal dilatation or
surrounding inflammatory changes.

Spleen: Normal in size without focal abnormality.

Adrenals/Urinary Tract: Adrenal glands are stable. Right kidney
remains normal in appearance with no obstruction. The right ureter
is normal. Persistent replacement of much of the left kidney with
fluid and gas. A pigtail catheter terminates within the superior
periphery of this collection. The maximum dimension of this
collection is 8.1 x 8.2 cm today versus 10.5 x 9.8 cm previously.
There is preservation of the renal cortex in the lower pole. Fluid
and stranding adjacent to the left kidney remains, similar in the
interval. The fluid appears free and non loculated. The left ureter
and bladder are normal.

Stomach/Bowel: The stomach and small bowel are normal. Severe fecal
loading throughout the length of colon. The appendix is normal.

Vascular/Lymphatic: No aneurysm or atherosclerotic change. Mild
adenopathy in the left periaortic region, similar in the interval,
likely reactive. No new adenopathy.

Reproductive: Tubal ligation clips. Uterus and ovaries are otherwise
unremarkable.

Other: No abdominal wall hernia or abnormality. No abdominopelvic
ascites.

Musculoskeletal: No acute or significant osseous findings.
IMPRESSION: 1. There is a pigtail drainage catheter along the superior periphery
of the remaining abscess. The abscess appears smaller. Most of the
remaining collection is inferior to the pigtail.
2. Tiny left pleural effusion, new in the interval.
3. Severe fecal loading throughout the colon.
4. Reactive adenopathy in the left periaortic region.

## 2022-11-06 IMAGING — US US RENAL
1 series · 15 of 25 positions shown · non-contrast
Comparison: 11/28/2019 and prior.

CLINICAL DATA: Left renal atrophy.

EXAM:
RENAL / URINARY TRACT ULTRASOUND COMPLETE

[Series 1: us renal · 15 of 30 slices shown]
[im 1/30]
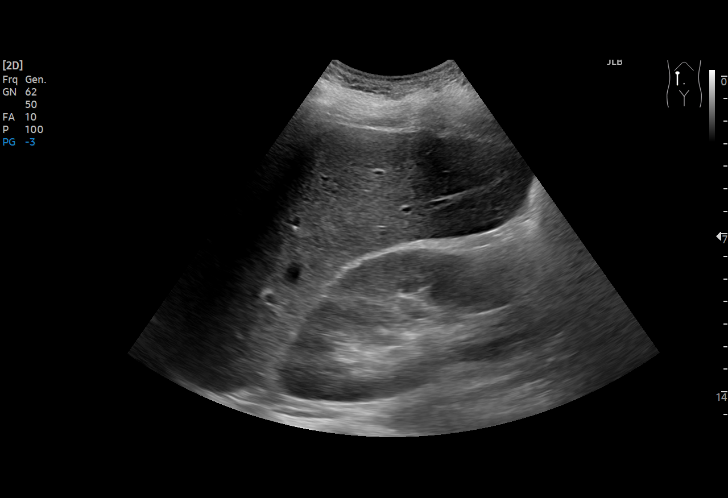
[im 3/30]
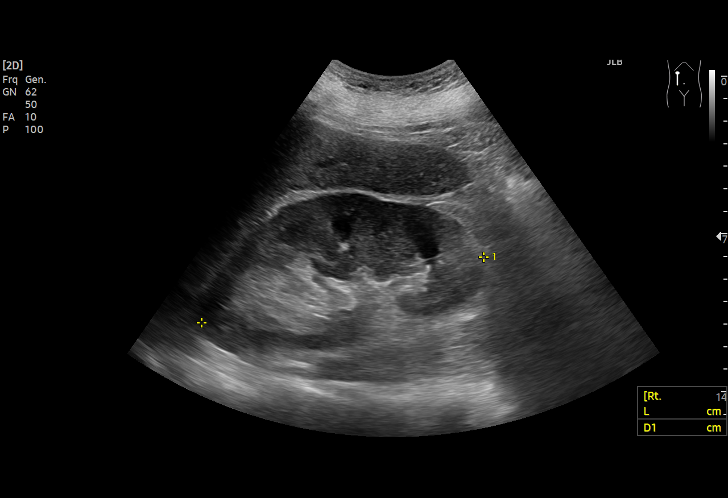
[im 5/30]
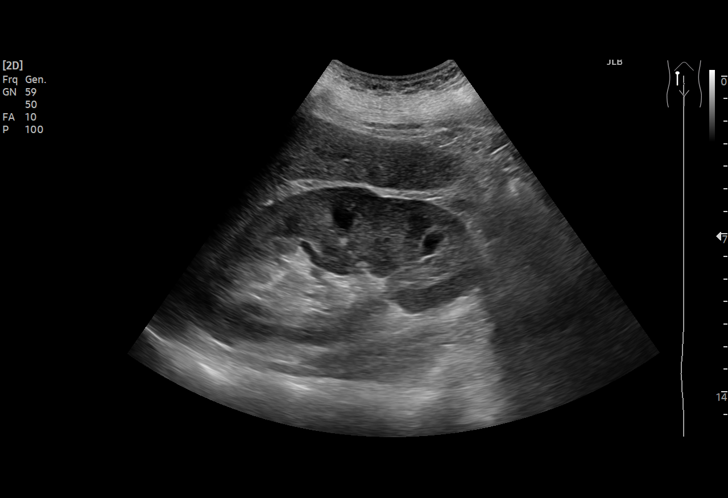
[im 7/30]
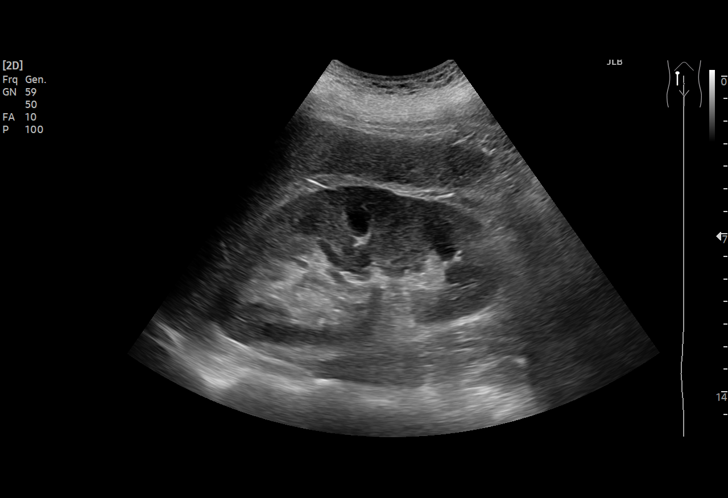
[im 9/30]
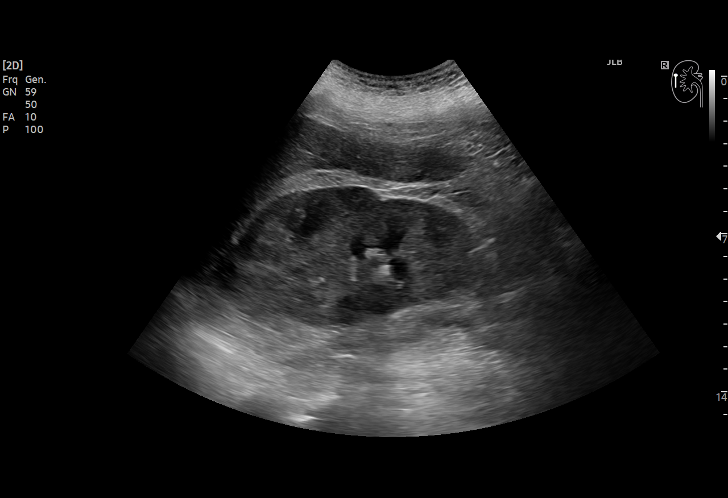
[im 11/30]
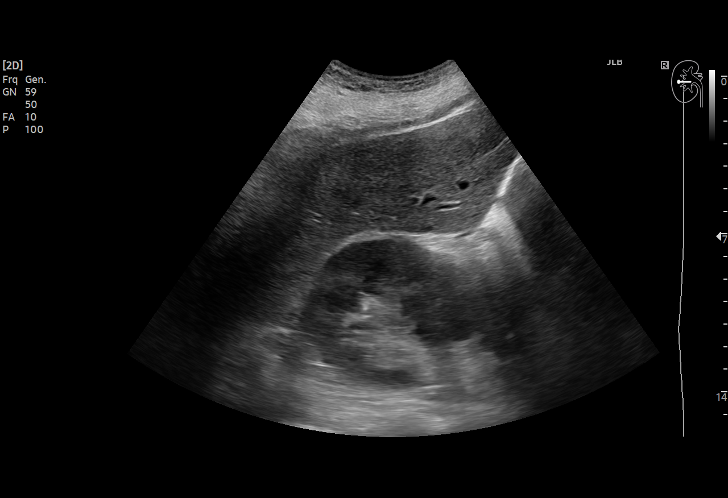
[im 13/30]
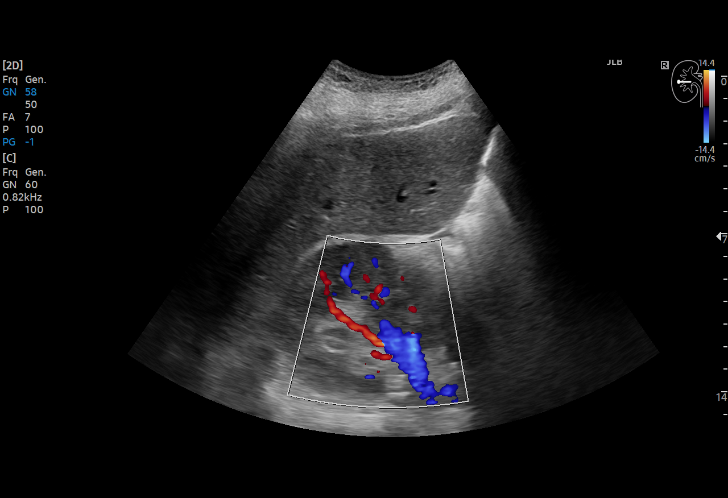
[im 15/30]
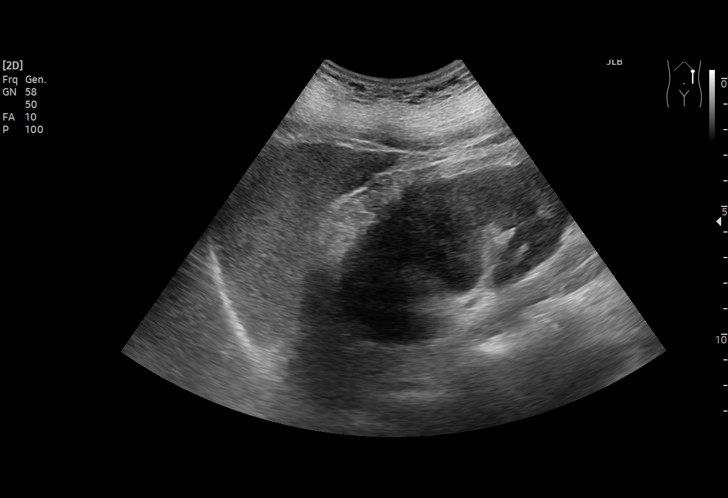
[im 17/30]
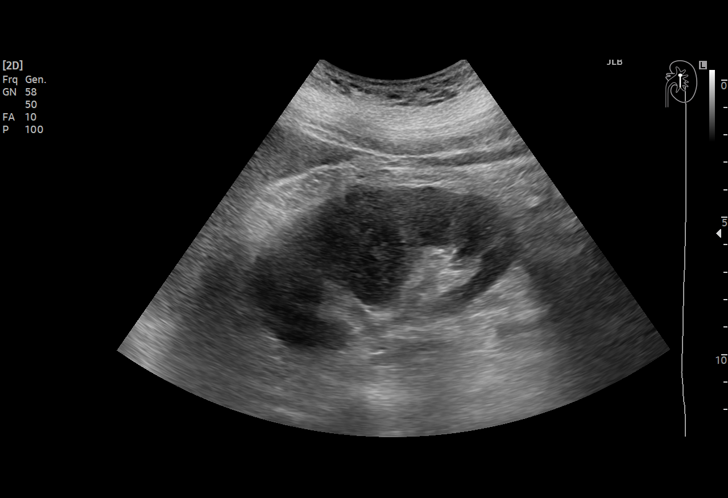
[im 19/30]
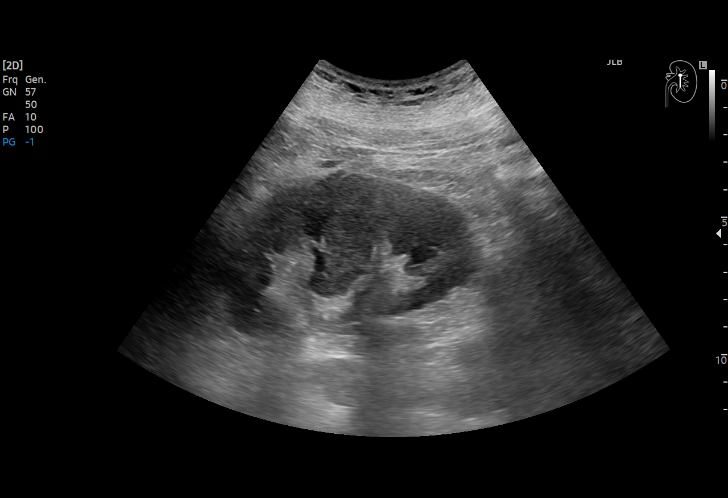
[im 21/30]
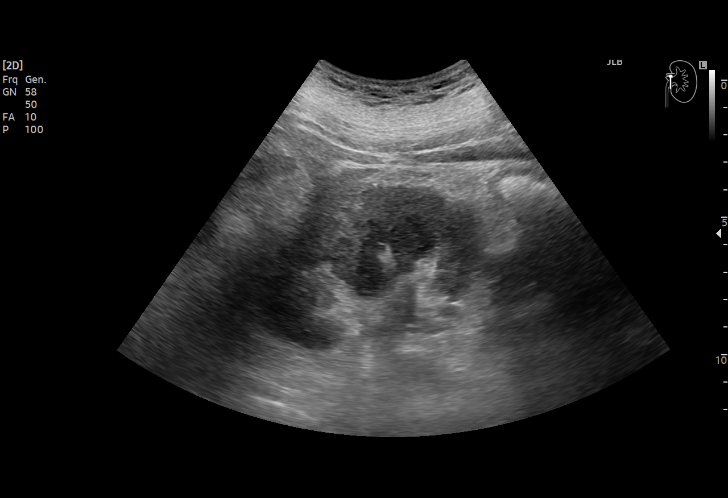
[im 23/30]
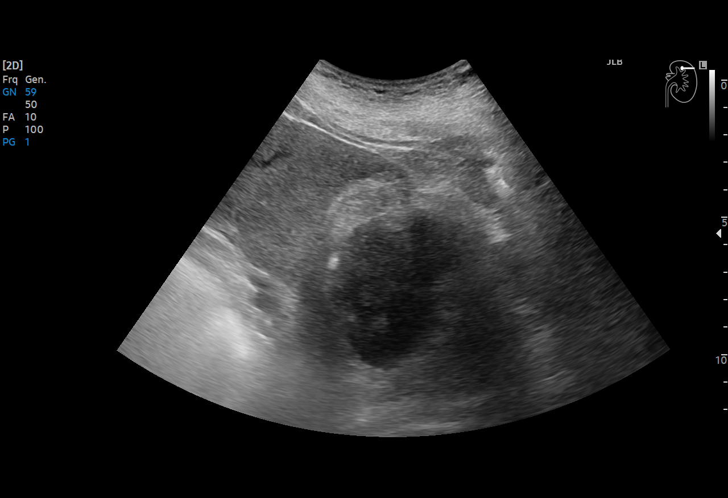
[im 25/30]
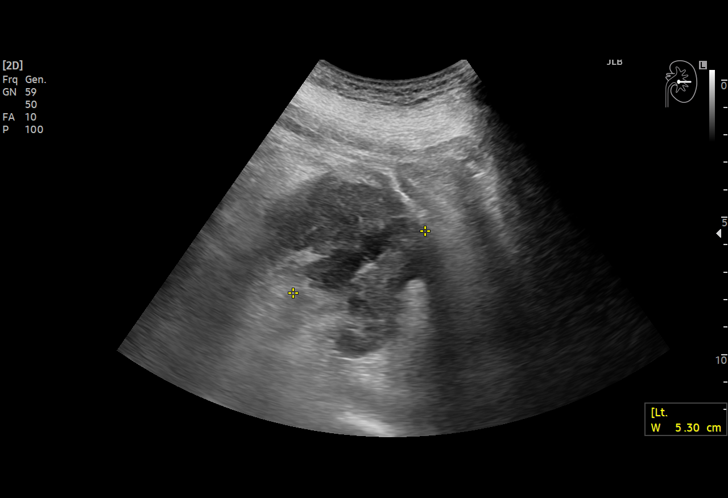
[im 27/30]
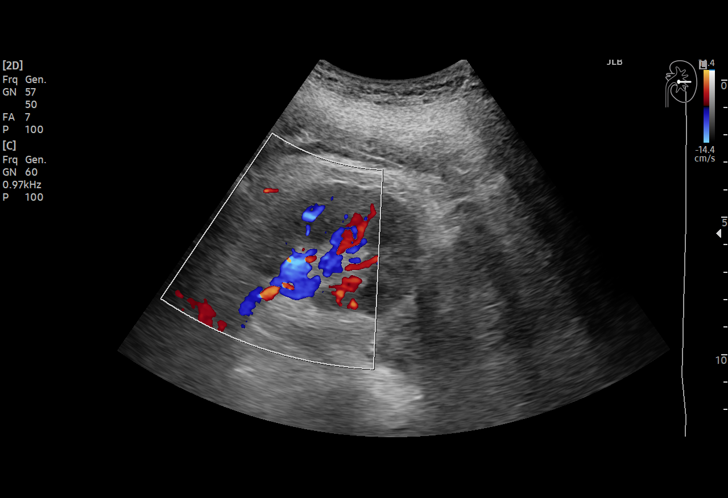
[im 30/30]
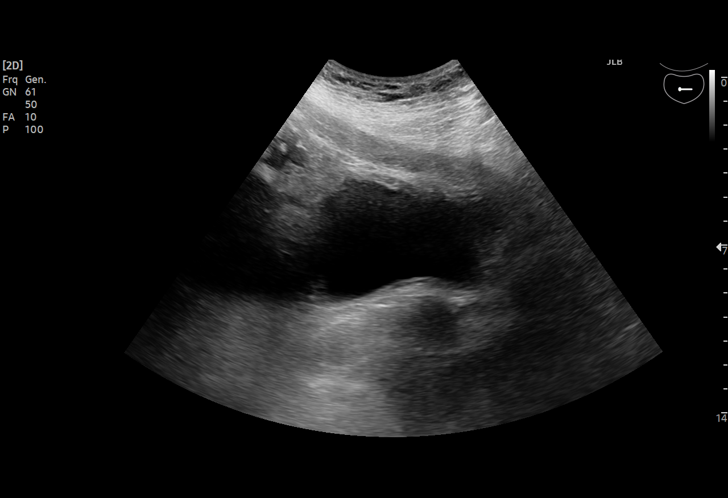

[15 of 25 positions shown; findings below may reference images not displayed]

FINDINGS: Right Kidney:

Renal measurements: 12.8 x 6.6 x 6.2 cm = volume: 273.1 mL.
Echogenicity within normal limits. No mass or hydronephrosis
visualized.

Left Kidney:

Renal measurements: 9.3 x 5.5 x 5.3 cm = volume: 142.7 mL.
Echogenicity within normal limits. No mass or hydronephrosis
visualized.

Bladder:

Appears normal for degree of bladder distention.

Other:

None.
IMPRESSION: Atrophic left kidney.

Normal sonographic appearance of the right kidney.

## 2023-01-07 ENCOUNTER — Encounter: Payer: Self-pay | Admitting: Emergency Medicine

## 2023-01-07 ENCOUNTER — Ambulatory Visit
Admission: EM | Admit: 2023-01-07 | Discharge: 2023-01-07 | Disposition: A | Discharge status: Home / Self Care | Payer: 59

## 2023-01-07 DIAGNOSIS — R1011 Right upper quadrant pain: Secondary | ICD-10-CM

## 2023-01-07 NOTE — Discharge Instructions (Addendum)
Please go to the emergency department at [facility] for evaluation of your abdominal pain.  Do not eat or drink anything till after you were evaluated in the ER.

## 2023-01-07 NOTE — ED Provider Notes (Signed)
[facility]    CSN: [id number] Arrival date & time: 01/07/23  1227      History   Chief Complaint Chief Complaint  Patient presents with   Abdominal Pain    RUQ     HPI Katelyn Barnes is a 42 y.o. female.   HPI  42 year old female with past medical history significant for type 2 diabetes, obesity, generalized anxiety disorder, DVT, and anemia presents for evaluation of right upper quadrant pain that started suddenly at 2 AM this morning.  She describes it as a sharp, constant, burning pain that she rates as a 7/10.  This is not associated with any fever, vomiting, or diarrhea.  She does endorse nausea and a decreased appetite.  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   Renal abscess 09/05/2019   Left flank pain    Diabetes mellitus (HCC)    Essential hypertension    Iron deficiency 08/09/2019   Hyperglycemia    Acute respiratory failure (HCC)    Sepsis (HCC) 06/11/2019   Acute renal failure (ARF) (HCC) 06/11/2019   Pyelonephritis 06/11/2019   DKA (diabetic ketoacidosis) (HCC) 06/11/2019   Obesity hypoventilation syndrome (HCC) 06/11/2019   Severe sepsis with acute organ dysfunction (HCC) 06/11/2019   Radiculopathy 05/05/2016   Obesity, Class III, BMI 40-49.9 (morbid obesity) (HCC) 06/27/2014   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   Diabetic neuropathy (HCC) 11/16/2012   PCOS (polycystic ovarian syndrome) 11/16/2012    Bilateral carpal tunnel syndrome 09/12/2012   Right anterior knee pain 12/27/2011   Diabetes mellitus type 2, uncontrolled 06/10/2011   Menorrhagia 06/10/2011   Peripheral edema 06/10/2011   Sciatica 04/27/2011   Dysthymic disorder 08/12/2010   Generalized anxiety disorder 08/12/2010    Past Surgical History:  Procedure Laterality Date   AMPUTATION Right 12/12/2020   Procedure: AMPUTATION RAY-4th Ray Partial;  Surgeon: Rosetta Posner, DPM;  Location: [facility];  Service: Podiatry;  Laterality: Right;   ANTERIOR CERVICAL DECOMP/DISCECTOMY FUSION Right 05/05/2016   Procedure: ANTERIOR CERVICAL DECOMPRESSION FUSION, CERVICAL 7- THORACIC 1 WITH INSTRUMENTATION AND ALLOGRAFT;  Surgeon: Estill Bamberg, MD;  Location: [facility] OR;  Service: Orthopedics;  Laterality: Right;  ANTERIOR CERVICAL DECOMPRESSION FUSION, CERVICAL 7- THORACIC 1 WITH INSTRUMENTATION AND ALLOGRAFT   BACK SURGERY  2016   discectomy    BONE BIOPSY Right 12/12/2020   Procedure: BONE BIOPSY;  Surgeon: Rosetta Posner, DPM;  Location: [facility];  Service: Podiatry;  Laterality: Right;   CARPAL TUNNEL RELEASE Bilateral    CYSTOSCOPY W/ RETROGRADES Left 07/06/2019   Procedure: CYSTOSCOPY WITH RETROGRADE PYELOGRAM;  Surgeon: [name], MD;  Location: [facility];  Service: Urology;  Laterality: Left;   CYSTOSCOPY WITH URETEROSCOPY AND STENT PLACEMENT Left 06/11/2019   Procedure: CYSTOSCOPY WITH URETEROSCOPY AND STENT PLACEMENT;  Surgeon: Jerilee Field, MD;  Location: [facility];  Service: Urology;  Laterality: Left;   CYSTOSCOPY WITH URETEROSCOPY AND  STENT PLACEMENT Left 07/06/2019   Procedure: CYSTOSCOPY WITH URETEROSCOPY AND STENT exchange;  Surgeon: [name], MD;  Location: [facility];  Service: Urology;  Laterality: Left;   IR NEPHROSTOMY EXCHANGE LEFT  10/19/2019   LAPAROSCOPIC NEPHRECTOMY, HAND ASSISTED Left 04/04/2020   Procedure: HAND ASSISTED LAPAROSCOPIC NEPHRECTOMY;  Surgeon: [name], MD;  Location: [facility];   Service: Urology;  Laterality: Left;   SHOULDER ARTHROSCOPY     TUBAL LIGATION  08/2007    OB History   No obstetric history on file.      Home Medications    Prior to Admission medications   Medication Sig Start Date End Date Taking? Authorizing Provider  atorvastatin (LIPITOR) 40 MG tablet Take 40 mg by mouth daily. 07/10/19  Yes [provider]  insulin isophane & regular human (HUMULIN 70/30 KWIKPEN) (70-30) 100 UNIT/ML KwikPen Inject 40 Units into the skin 2 (two) times daily with a meal.  07/10/19 01/07/23 Yes [provider]  losartan (COZAAR) 50 MG tablet Take 1 tablet (50 mg total) by mouth daily. 09/13/19  Yes Shahmehdi, Seyed A, MD  metFORMIN (GLUCOPHAGE) 500 MG tablet Take 500 mg by mouth 2 (two) times daily. 08/04/19  Yes [provider]  docusate sodium (COLACE) 100 MG capsule Take 1 capsule (100 mg total) by mouth 2 (two) times daily. 04/07/20   [name], MD  HYDROcodone-acetaminophen (NORCO/VICODIN) 5-325 MG tablet Take 1-2 tablets by mouth every 4 (four) hours as needed for moderate pain. 12/13/20   Arnetha Courser, MD  meclizine (ANTIVERT) 25 MG tablet Take 1 tablet (25 mg total) by mouth 3 (three) times daily as needed for dizziness. 07/16/20   Gilles Chiquito, MD  MOUNJARO 12.5 MG/0.5ML Pen SMARTSIG:12.5 Milligram(s) SUB-Q Once a Week    [provider]  Multiple Vitamins-Minerals (MULTIVITAMIN WITH MINERALS) tablet Take 1 tablet by mouth daily.    [provider]  mupirocin ointment (BACTROBAN) 2 % Apply 1 application topically 3 (three) times daily. 12/05/20   [provider]  nystatin cream (MYCOSTATIN) Apply topically 2 (two) times daily. 12/13/20   Arnetha Courser, MD  terbinafine (LAMISIL) 250 MG tablet Take 250 mg by mouth daily. 12/01/20   [provider]  topiramate (TOPAMAX) 50 MG tablet Take 150 mg by mouth 2 (two) times daily.    [provider]    Family History History reviewed. No  pertinent family history.  Social History Social History   Tobacco Use   Smoking status: Never   Smokeless tobacco: Never  Vaping Use   Vaping status: Never Used  Substance Use Topics   Alcohol use: No   Drug use: No     Allergies   Macrobid [nitrofurantoin] and Penicillins   Review of Systems Review of Systems  Constitutional:  Negative for fever.  Gastrointestinal:  Positive for abdominal pain and nausea. Negative for constipation, diarrhea and vomiting.     Physical Exam Triage Vital Signs ED Triage Vitals  Encounter Vitals Group     BP 01/07/23 1327 (!) 144/82     Systolic BP Percentile --      Diastolic BP Percentile --      Pulse Rate 01/07/23 1327 85     Resp 01/07/23 1327 14     Temp 01/07/23 1327 97.8 F (36.6 C)     Temp Source 01/07/23 1327 Oral     SpO2 01/07/23 1327 100 %     Weight 01/07/23 1323 230 lb (104.3 kg)  Height 01/07/23 1323 5\' 6"  (1.676 m)     Head Circumference --      Peak Flow --      Pain Score 01/07/23 1323 7     Pain Loc --      Pain Education --      Exclude from Growth Chart --    No data found.  Updated Vital Signs BP (!) 144/82 (BP Location: Left Arm)   Pulse 85   Temp 97.8 F (36.6 C) (Oral)   Resp 14   Ht 5\' 6"  (1.676 m)   Wt 230 lb (104.3 kg)   LMP 01/02/2023 (Approximate)   SpO2 100%   BMI 37.12 kg/m   Visual Acuity Right Eye Distance:   Left Eye Distance:   Bilateral Distance:    Right Eye Near:   Left Eye Near:    Bilateral Near:     Physical Exam Vitals and nursing note reviewed.  Constitutional:      General: She is in acute distress.     Appearance: Normal appearance. She is not ill-appearing.  HENT:     Head: Normocephalic and atraumatic.  Cardiovascular:     Rate and Rhythm: Normal rate and regular rhythm.     Pulses: Normal pulses.     Heart sounds: Normal heart sounds. No murmur heard.    No friction rub. No gallop.  Pulmonary:     Effort: Pulmonary effort is normal.     Breath  sounds: Normal breath sounds. No wheezing, rhonchi or rales.  Abdominal:     General: Abdomen is flat.     Palpations: Abdomen is soft.     Tenderness: There is abdominal tenderness. There is no guarding or rebound.  Skin:    General: Skin is warm and dry.     Capillary Refill: Capillary refill takes less than 2 seconds.     Findings: No rash.  Neurological:     General: No focal deficit present.     Mental Status: She is alert and oriented to person, place, and time.      UC Treatments / Results  Labs (all labs ordered are listed, but only abnormal results are displayed) Labs Reviewed - No data to display   EKG   Radiology No results found.  Procedures Procedures (including critical care time)  Medications Ordered in UC Medications - No data to display  Initial Impression / Assessment and Plan / UC Course  I have reviewed the triage vital signs and the nursing notes.  Pertinent labs & imaging results that were available during my care of the patient were reviewed by me and considered in my medical decision making (see chart for details).   Patient is a pleasant 42 year old female who appears to be in a moderate degree of pain presenting for evaluation of right upper quadrant pain with associated nausea and decreased appetite that started at 2 AM this morning.  She is rating her pain as a 7/10 and describes as a constant, sharp burning pain.  She still has her gallbladder.  On exam her abdomen is soft and flat with tenderness in her epigastrium and the right upper quadrant.  She has a positive Murphy sign as well.  I am concerned that this might possibly be the patient's gallbladder and she needs complex imaging of her abdomen which we are unable to perform here at the urgent care.  I have therefore suggested she go to the emergency room for evaluation and she has elected to  go to [facility] via POV.   Final Clinical Impressions(s) / UC Diagnoses   Final diagnoses:   Right upper quadrant abdominal pain     Discharge Instructions      Please go to the emergency department at [facility] for evaluation of your abdominal pain.  Do not eat or drink anything till after you were evaluated in the ER.     ED Prescriptions   None    PDMP not reviewed this encounter.   Becky Augusta, NP 01/07/23 [phone]

## 2023-01-07 NOTE — ED Notes (Signed)
Patient is being discharged from the Urgent Care and sent to the [facility] Emergency Department via private vehicle . Per Becky Augusta, NP, patient is in need of higher level of care due to RUQ abdominal pain. Patient is aware and verbalizes understanding of plan of care.  Vitals:   01/07/23 1327  BP: (!) 144/82  Pulse: 85  Resp: 14  Temp: 97.8 F (36.6 C)  SpO2: 100%

## 2023-01-07 NOTE — ED Triage Notes (Signed)
Patient reports RUQ pain that radiates down her side and into her right thigh that started at 2 am this morning.  Patient reports nausea.  Patient denies V/D.  Patient denies fevers.  Patient denies urinary symptoms.

## 2023-02-24 ENCOUNTER — Other Ambulatory Visit: Payer: Self-pay | Admitting: Infectious Diseases

## 2023-02-24 ENCOUNTER — Other Ambulatory Visit: Payer: Self-pay

## 2023-02-24 ENCOUNTER — Ambulatory Visit: Payer: Managed Care, Other (non HMO) | Attending: Infectious Diseases | Admitting: Infectious Diseases

## 2023-02-24 VITALS — BP 131/84 | HR 94 | Temp 98.3°F | Wt 217.4 lb

## 2023-02-24 DIAGNOSIS — Z905 Acquired absence of kidney: Secondary | ICD-10-CM | POA: Insufficient documentation

## 2023-02-24 DIAGNOSIS — A4902 Methicillin resistant Staphylococcus aureus infection, unspecified site: Secondary | ICD-10-CM | POA: Diagnosis not present

## 2023-02-24 DIAGNOSIS — L97518 Non-pressure chronic ulcer of other part of right foot with other specified severity: Secondary | ICD-10-CM | POA: Diagnosis not present

## 2023-02-24 DIAGNOSIS — E785 Hyperlipidemia, unspecified: Secondary | ICD-10-CM | POA: Insufficient documentation

## 2023-02-24 DIAGNOSIS — B9562 Methicillin resistant Staphylococcus aureus infection as the cause of diseases classified elsewhere: Secondary | ICD-10-CM | POA: Diagnosis not present

## 2023-02-24 DIAGNOSIS — Z7984 Long term (current) use of oral hypoglycemic drugs: Secondary | ICD-10-CM | POA: Insufficient documentation

## 2023-02-24 DIAGNOSIS — E11621 Type 2 diabetes mellitus with foot ulcer: Secondary | ICD-10-CM | POA: Diagnosis not present

## 2023-02-24 DIAGNOSIS — Z79899 Other long term (current) drug therapy: Secondary | ICD-10-CM | POA: Diagnosis not present

## 2023-02-24 DIAGNOSIS — L0889 Other specified local infections of the skin and subcutaneous tissue: Secondary | ICD-10-CM | POA: Insufficient documentation

## 2023-02-24 DIAGNOSIS — Z9049 Acquired absence of other specified parts of digestive tract: Secondary | ICD-10-CM | POA: Insufficient documentation

## 2023-02-24 DIAGNOSIS — E1140 Type 2 diabetes mellitus with diabetic neuropathy, unspecified: Secondary | ICD-10-CM | POA: Insufficient documentation

## 2023-02-24 DIAGNOSIS — L089 Local infection of the skin and subcutaneous tissue, unspecified: Secondary | ICD-10-CM | POA: Diagnosis not present

## 2023-02-24 MED ORDER — LINEZOLID 600 MG PO TABS
600.0000 mg | ORAL_TABLET | Freq: Two times a day (BID) | ORAL | 1 refills | Status: AC
Start: 2023-02-24 — End: ?
  Filled 2023-02-24: qty 28, 14d supply, fill #0
  Filled 2023-03-05 – 2023-07-16 (×2): qty 28, 14d supply, fill #1

## 2023-02-24 NOTE — Progress Notes (Signed)
NAME: Katelyn Barnes  DOB: [birth year]  MRN: [id number]  Date/Time: 02/24/2023 10:13 AM   Subjective:  Katelyn Barnes at this started admission for I saw her this morning just calling you just want to talk to you I called back later ?  Katelyn Barnes is a 43 year old female with diabetes and neuropathy , left nephrectomy for xanthogranulomatous pyelonephritis, recent cholecystectomy who presents with a MRSA infection in the great toe. She was referred by Dr. Excell Seltzer for evaluation of her MRSA infection.  She developed a blister on her great toe approximately three weeks ago, which peeled off and resulted in the current wound. She initially sought care from her regular doctor and was later seen by Dr. Excell Seltzer, who performed a culture that identified MRSA. She is prescribed doxycycline, which is resistant. She is not currently taking any antidepressants or anti-anxiety medications, having stopped sertraline in October.  She has a history of diabetes, managed with Mounjaro, metformin, and insulin, with her insulin dose being reduced due to improving A1c levels. She also takes Lipitor for cholesterol and losartan for hypertension. She has diabetic neuropathy and wears diabetic shoes to prevent injuries, as she does not go barefoot.  Her past surgical history includes a cholecystectomy in January 2025 and a nephrectomy of the left kidney due to xanthogranulomatous pyelonephritis approximately three to four years ago. She also had a tubal ligation. She has a known allergy to penicillin, which caused a rash 14 years ago.  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 She has two kittens at home and ensures they do not lick her wound.   Past Medical History:  Diagnosis Date   Anemia    iron transfusions x 2   COVID-19    DDD (degenerative disc disease), lumbar    Diabetes mellitus without complication (HCC)    Type II   DVT (deep venous thrombosis) (HCC) 2016   post back surgery - was on  Eliquis 3 months   Kidney stone     Past Surgical History:  Procedure Laterality Date   AMPUTATION Right 12/12/2020   Procedure: AMPUTATION RAY-4th Ray Partial;  Surgeon: Rosetta Posner, DPM;  Location: [facility];  Service: Podiatry;  Laterality: Right;   ANTERIOR CERVICAL DECOMP/DISCECTOMY FUSION Right 05/05/2016   Procedure: ANTERIOR CERVICAL DECOMPRESSION FUSION, CERVICAL 7- THORACIC 1 WITH INSTRUMENTATION AND ALLOGRAFT;  Surgeon: Estill Bamberg, MD;  Location: [facility];  Service: Orthopedics;  Laterality: Right;  ANTERIOR CERVICAL DECOMPRESSION FUSION, CERVICAL 7- THORACIC 1 WITH INSTRUMENTATION AND ALLOGRAFT   BACK SURGERY  2016   discectomy    BONE BIOPSY Right 12/12/2020   Procedure: BONE BIOPSY;  Surgeon: Rosetta Posner, DPM;  Location: [facility];  Service: Podiatry;  Laterality: Right;   CARPAL TUNNEL RELEASE Bilateral    CYSTOSCOPY W/ RETROGRADES Left 07/06/2019   Procedure: CYSTOSCOPY WITH RETROGRADE PYELOGRAM;  Surgeon: [name], MD;  Location: [facility];  Service: Urology;  Laterality: Left;   CYSTOSCOPY WITH URETEROSCOPY AND STENT PLACEMENT Left 06/11/2019   Procedure: CYSTOSCOPY WITH URETEROSCOPY AND STENT PLACEMENT;  Surgeon: Jerilee Field, MD;  Location: [facility];  Service: Urology;  Laterality: Left;   CYSTOSCOPY WITH URETEROSCOPY AND STENT PLACEMENT Left 07/06/2019   Procedure: CYSTOSCOPY WITH URETEROSCOPY AND STENT exchange;  Surgeon: [name], MD;  Location: [facility];  Service: Urology;  Laterality: Left;   IR NEPHROSTOMY EXCHANGE LEFT  10/19/2019   LAPAROSCOPIC NEPHRECTOMY, HAND ASSISTED Left 04/04/2020   Procedure: HAND ASSISTED LAPAROSCOPIC NEPHRECTOMY;  Surgeon: [name], MD;  Location: [facility];  Service: Urology;  Laterality: Left;   SHOULDER ARTHROSCOPY     TUBAL LIGATION  08/2007    Social History   Socioeconomic History   Marital status: Legally Separated    Spouse name: Not on file   Number of children: 2   Years of education: Not on file    Highest education level: Not on file  Occupational History   Not on file  Tobacco Use   Smoking status: Never   Smokeless tobacco: Never  Vaping Use   Vaping status: Never Used  Substance and Sexual Activity   Alcohol use: No   Drug use: No   Sexual activity: Yes  Other Topics Concern   Not on file  Social History Narrative   Not on file   Social Drivers of Health   Financial Resource Strain: Low Risk  (02/16/2023)   Received from [facility]   Overall Financial Resource Strain (CARDIA)    Difficulty of Paying Living Expenses: Not very hard  Food Insecurity: No Food Insecurity (02/16/2023)   Received [facility]   Hunger Vital Sign    Worried About Running Out of Food in the Last Year: Never true    Ran Out of Food in the Last Year: Never true  Transportation Needs: No Transportation Needs (02/16/2023)   Received from [facility] - Transportation    In the past 12 months, has lack of transportation kept you from medical appointments or from getting medications?: No    Lack of Transportation (Non-Medical): No  Physical Activity: Not on file  Stress: Not on file  Social Connections: Not on file  Intimate Partner Violence: Not on file    No family history on file. Allergies  Allergen Reactions   Macrobid [Nitrofurantoin] Nausea And Vomiting   Penicillins Rash    14 years ago    I? Current Outpatient Medications  Medication Sig Dispense Refill   atorvastatin (LIPITOR) 40 MG tablet Take 40 mg by mouth daily.     docusate sodium (COLACE) 100 MG capsule Take 1 capsule (100 mg total) by mouth 2 (two) times daily. 10 capsule 0   doxycycline (VIBRAMYCIN) 100 MG capsule Take 100 mg by mouth 2 (two) times daily.     glucose blood (ACCU-CHEK AVIVA PLUS) test strip 4 (four) times daily     losartan (COZAAR) 50 MG tablet Take 1 tablet (50 mg total) by mouth daily. 30 tablet 0   meclizine (ANTIVERT) 25 MG tablet Take  1 tablet (25 mg total) by mouth 3 (three) times daily as needed for dizziness. 15 tablet 0   metFORMIN (GLUCOPHAGE) 500 MG tablet Take 500 mg by mouth 2 (two) times daily.     MOUNJARO 12.5 MG/0.5ML Pen SMARTSIG:12.5 Milligram(s) SUB-Q Once a Week     Multiple Vitamins-Minerals (MULTIVITAMIN WITH MINERALS) tablet Take 1 tablet by mouth daily.     mupirocin ointment (BACTROBAN) 2 % Apply 1 application topically 3 (three) times daily.     SANTYL 250 UNIT/GM ointment Apply topically daily.     HYDROcodone-acetaminophen (NORCO/VICODIN) 5-325 MG tablet Take 1-2 tablets by mouth every 4 (four) hours as needed for moderate pain. (Patient not taking: Reported on 02/24/2023) 30 tablet 0   insulin isophane & regular human (HUMULIN 70/30 KWIKPEN) (70-30) 100 UNIT/ML KwikPen Inject 40 Units into the skin 2 (two) times daily with a meal.  No current facility-administered medications for this visit.     Abtx:  Anti-infectives (From admission, onward)    None       REVIEW OF SYSTEMS:  Const: negative fever, negative chills, negative weight loss Eyes: negative diplopia or visual changes, negative eye pain ENT: negative coryza, negative sore throat Resp: negative cough, hemoptysis, dyspnea Cards: negative for chest pain, palpitations, lower extremity edema GU: negative for frequency, dysuria and hematuria GI: Negative for abdominal pain, diarrhea, bleeding, constipation Skin: negative for rash and pruritus Heme: negative for easy bruising and gum/nose bleeding MS: as above Neurolo:negative for headaches, dizziness, vertigo, memory problems  Psych: negative for feelings of anxiety, depression  Endocrine: , diabetes Allergy/Immunology- PCN: Objective:  VITALS:  BP 131/84   Pulse 94   Temp 98.3 F (36.8 C) (Oral)   Wt 217 lb 6.4 oz (98.6 kg)   LMP 02/23/2023 (Approximate)   SpO2 99%   BMI 35.09 kg/m   PHYSICAL EXAM:  General: Alert, cooperative, no distress, appears stated age.  pale Head: Normocephalic, without obvious abnormality, atraumatic. Eyes: Conjunctivae clear, anicteric sclerae. Pupils are equal ENT Nares normal. No drainage or sinus tenderness. Lips, mucosa, and tongue normal. No Thrush Neck: Supple, symmetrical, no adenopathy, thyroid: non tender no carotid bruit and no JVD. Back: No CVA tenderness. Lungs: Clear to auscultation bilaterally. No Wheezing or Rhonchi. No rales. Heart: Regular rate and rhythm, no murmur, rub or gallop. Abdomen: Soft, non-tender,not distended. Bowel sounds normal. No masses Extremities: rt foot Great toe- ulceration        Skin: No rashes or lesions. Or bruising Lymph: Cervical, supraclavicular normal. Neurologic: Grossly non-focal Pertinent Labs CMP on 01/26/2018 fibrous as follows Creatinine 1.2, sodium 140, potassium 4.4, AST 22 ALT 20 T Hb 11.3, WBC 6.5, platelet 179  01/07/2023 pregnancy test was negative   Microbiology: 02/16/2023.  Wound culture MRSA  IMAGING RESULTS: I have personally reviewed the films ? Impression/Recommendation ?MRSA Infection of the Great Toe A three-week history of a wound on the great toe, initially a blister, revealed MRSA resistant to common antibiotics but sensitive to linezolid. Previous doxycycline treatment was ineffective. No current use of sertraline or other antidepressants allows safe administration of linezolid. Discussed risks of linezolid, including potential interactions with certain foods and medications, and the need for blood monitoring due to possible drops in white and platelet counts. Prescribe linezolid, 1 pill in the morning and 1 pill in the evening for 2 weeks. Advise avoiding vinegar, red wine, and limiting coffee to one cup. Avoid over-the-counter medications like Sudafed and any stimulants. Schedule a blood test after one week of antibiotic use to monitor white and platelet counts. Discontinue doxycycline. Send prescription to the pharmacy in the [facility]  building, as preferred.  Diabetes Mellitus Currently on Mounjaro, metformin, and insulin for diabetes management. Insulin dose is being reduced due to improving A1c levels. Continue Mounjaro, metformin, and insulin as prescribed by Dr. Fabienne Bruns.  Diabetic Neuropathy Reports neuropathy and uses diabetic shoes. No history of similar injuries to other toes. Continue using diabetic shoes and monitor for any new injuries or changes in sensation.  Hyperlipidemia On Lipitor for cholesterol management. Continue Lipitor as prescribed.  Hypertension On losartan for blood pressure management. Continue losartan as prescribed.  Post-Cholecystectomy Status Underwent cholecystectomy on January 28, 2023, at [facility]. Monitor for any post-surgical complications.  Xanthogranulomatous Pyelonephritis Left kidney removed 3-4 years ago due to recurrent infections and poor function. Monitor kidney function; recent creatinine was 1.2.  General  Health Maintenance Last blood work on January 27, 2023, showed creatinine 1.2, white count 6.5, and hemoglobin 11.3. Monitor blood work as needed.  Follow-up Follow up with Dr. Excell Seltzer next week for x-ray. Called pharmacy and confirmed medication availability. ? ? ________________________________________________ Discussed with patient in detail- follow PRN Note:  This document was prepared using Dragon voice recognition software and may include unintentional dictation errors.  P.S called patient to find out her LMP- she is not available- will check and if needed do Beta HCG

## 2023-02-24 NOTE — Patient Instructions (Signed)
Today, we discussed your MRSA infection in the great toe, your diabetes management, diabetic neuropathy, cholesterol, blood pressure, and general health maintenance. We reviewed your current medications and made adjustments where necessary.  YOUR PLAN:  -MRSA INFECTION OF THE GREAT TOE: You have a MRSA infection in your great toe, which is a type of bacterial infection resistant to many antibiotics. We will start you on linezolid, which you will take one pill in the morning and one in the evening for two weeks. Avoid vinegar, red wine, and limit coffee to one cup. Also, avoid over-the-counter medications like Sudafed. We will do a blood test after one week to monitor your white and platelet counts. Your previous medication, doxycycline, has been discontinued.  -DIABETES MELLITUS: Diabetes is a condition where your blood sugar levels are too high. Continue taking Mounjaro, metformin, and insulin as prescribed. Your insulin dose is being reduced due to improving A1c levels.  -DIABETIC NEUROPATHY: Diabetic neuropathy is nerve damage caused by diabetes. Continue using your diabetic shoes and monitor for any new injuries or changes in sensation.  -HYPERLIPIDEMIA: Hyperlipidemia means you have high cholesterol levels. Continue taking Lipitor as prescribed.  -HYPERTENSION: Hypertension is high blood pressure. Continue taking losartan as prescribed.  -POST-CHOLECYSTECTOMY STATUS: You had your gallbladder removed in January 2025. We will monitor for any post-surgical complications.  -XANTHOGRANULOMATOUS PYELONEPHRITIS: This is a type of chronic kidney infection that led to the removal of your left kidney. We will continue to monitor your kidney function; your recent creatinine level was 1.2.  -GENERAL HEALTH MAINTENANCE: Your last blood work showed normal levels for creatinine, white count, and hemoglobin. We will continue to monitor your blood work as needed.  INSTRUCTIONS:  Follow up with Dr. Excell Seltzer  next week . Call the pharmacy to confirm medication availability.

## 2023-03-05 ENCOUNTER — Other Ambulatory Visit (HOSPITAL_COMMUNITY): Payer: Self-pay

## 2023-07-18 ENCOUNTER — Other Ambulatory Visit (HOSPITAL_COMMUNITY): Payer: Self-pay

## 2023-07-19 ENCOUNTER — Other Ambulatory Visit: Payer: Self-pay
